# Patient Record
Sex: Female | Born: 1945 | Race: White | Hispanic: No | Marital: Married | Smoking: Never smoker
Health system: Southern US, Community
[De-identification: ages and names within clinical notes are randomized; demographics above are authoritative.]

## PROBLEM LIST (undated history)

## (undated) DIAGNOSIS — E559 Vitamin D deficiency, unspecified: Secondary | ICD-10-CM

## (undated) DIAGNOSIS — E669 Obesity, unspecified: Secondary | ICD-10-CM

## (undated) DIAGNOSIS — E785 Hyperlipidemia, unspecified: Secondary | ICD-10-CM

## (undated) DIAGNOSIS — M810 Age-related osteoporosis without current pathological fracture: Secondary | ICD-10-CM

## (undated) DIAGNOSIS — D649 Anemia, unspecified: Secondary | ICD-10-CM

## (undated) DIAGNOSIS — R7301 Impaired fasting glucose: Secondary | ICD-10-CM

## (undated) HISTORY — DX: Anemia, unspecified: D64.9

## (undated) HISTORY — DX: Impaired fasting glucose: R73.01

## (undated) HISTORY — DX: Age-related osteoporosis without current pathological fracture: M81.0

## (undated) HISTORY — DX: Hyperlipidemia, unspecified: E78.5

## (undated) HISTORY — DX: Vitamin D deficiency, unspecified: E55.9

## (undated) HISTORY — PX: FRACTURE SURGERY: SHX138

## (undated) HISTORY — PX: ABDOMINAL HYSTERECTOMY: SHX81

## (undated) HISTORY — DX: Obesity, unspecified: E66.9

---

## 2007-09-01 ENCOUNTER — Ambulatory Visit: Payer: Self-pay | Admitting: General Surgery

## 2009-05-28 ENCOUNTER — Ambulatory Visit: Payer: Self-pay | Admitting: Unknown Physician Specialty

## 2009-05-28 ENCOUNTER — Emergency Department: Payer: Self-pay | Admitting: Emergency Medicine

## 2009-05-29 ENCOUNTER — Ambulatory Visit: Payer: Self-pay | Admitting: Unknown Physician Specialty

## 2012-07-20 DIAGNOSIS — G4700 Insomnia, unspecified: Secondary | ICD-10-CM | POA: Insufficient documentation

## 2012-07-20 DIAGNOSIS — K649 Unspecified hemorrhoids: Secondary | ICD-10-CM | POA: Insufficient documentation

## 2014-08-29 LAB — HM MAMMOGRAPHY

## 2014-09-17 LAB — HM DEXA SCAN

## 2015-08-13 DIAGNOSIS — E782 Mixed hyperlipidemia: Secondary | ICD-10-CM | POA: Insufficient documentation

## 2015-08-13 DIAGNOSIS — M810 Age-related osteoporosis without current pathological fracture: Secondary | ICD-10-CM | POA: Insufficient documentation

## 2015-08-13 DIAGNOSIS — E669 Obesity, unspecified: Secondary | ICD-10-CM | POA: Insufficient documentation

## 2015-08-13 DIAGNOSIS — E785 Hyperlipidemia, unspecified: Secondary | ICD-10-CM | POA: Insufficient documentation

## 2015-08-13 DIAGNOSIS — E559 Vitamin D deficiency, unspecified: Secondary | ICD-10-CM | POA: Insufficient documentation

## 2015-08-13 DIAGNOSIS — R7301 Impaired fasting glucose: Secondary | ICD-10-CM | POA: Insufficient documentation

## 2015-08-13 DIAGNOSIS — D649 Anemia, unspecified: Secondary | ICD-10-CM | POA: Insufficient documentation

## 2015-08-15 ENCOUNTER — Encounter: Payer: Self-pay | Admitting: Family Medicine

## 2015-08-29 ENCOUNTER — Encounter: Payer: Self-pay | Admitting: Family Medicine

## 2015-09-12 ENCOUNTER — Encounter: Payer: Self-pay | Admitting: Family Medicine

## 2015-09-20 ENCOUNTER — Ambulatory Visit (INDEPENDENT_AMBULATORY_CARE_PROVIDER_SITE_OTHER): Payer: Medicare Other | Admitting: Family Medicine

## 2015-09-20 ENCOUNTER — Encounter: Payer: Self-pay | Admitting: Family Medicine

## 2015-09-20 VITALS — BP 154/68 | HR 60 | Temp 98.7°F | Ht 60.5 in | Wt 179.0 lb

## 2015-09-20 DIAGNOSIS — E559 Vitamin D deficiency, unspecified: Secondary | ICD-10-CM | POA: Diagnosis not present

## 2015-09-20 DIAGNOSIS — K5900 Constipation, unspecified: Secondary | ICD-10-CM | POA: Insufficient documentation

## 2015-09-20 DIAGNOSIS — Z1239 Encounter for other screening for malignant neoplasm of breast: Secondary | ICD-10-CM

## 2015-09-20 DIAGNOSIS — R7301 Impaired fasting glucose: Secondary | ICD-10-CM | POA: Diagnosis not present

## 2015-09-20 DIAGNOSIS — D649 Anemia, unspecified: Secondary | ICD-10-CM

## 2015-09-20 DIAGNOSIS — R635 Abnormal weight gain: Secondary | ICD-10-CM | POA: Diagnosis not present

## 2015-09-20 DIAGNOSIS — Z0000 Encounter for general adult medical examination without abnormal findings: Secondary | ICD-10-CM | POA: Diagnosis not present

## 2015-09-20 DIAGNOSIS — E785 Hyperlipidemia, unspecified: Secondary | ICD-10-CM | POA: Diagnosis not present

## 2015-09-20 DIAGNOSIS — R030 Elevated blood-pressure reading, without diagnosis of hypertension: Secondary | ICD-10-CM

## 2015-09-20 DIAGNOSIS — K5909 Other constipation: Secondary | ICD-10-CM

## 2015-09-20 DIAGNOSIS — Z23 Encounter for immunization: Secondary | ICD-10-CM

## 2015-09-20 DIAGNOSIS — E669 Obesity, unspecified: Secondary | ICD-10-CM

## 2015-09-20 DIAGNOSIS — M810 Age-related osteoporosis without current pathological fracture: Secondary | ICD-10-CM

## 2015-09-20 DIAGNOSIS — IMO0001 Reserved for inherently not codable concepts without codable children: Secondary | ICD-10-CM

## 2015-09-20 NOTE — Assessment & Plan Note (Addendum)
Encouraged weight loss, see AVS; she also try to increase her activity, start low and go slow, increase gradually as tolerated

## 2015-09-20 NOTE — Assessment & Plan Note (Signed)
Check cholesterol today, avoid saturated fats and work on weight loss

## 2015-09-20 NOTE — Assessment & Plan Note (Signed)
Check TSH, as 18 pounds of weight gain in one year is significant; start to gradually and slowly increase activity, watch diet

## 2015-09-20 NOTE — Assessment & Plan Note (Signed)
Normal CBC in May 2015; resolved
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
## 2015-09-20 NOTE — Patient Instructions (Addendum)
Your goal blood pressure is less than 150 mmHg on top. Try to follow the DASH guidelines (DASH stands for Dietary Approaches to Stop Hypertension) Try to limit the sodium in your diet.  Ideally, consume less than 1.5 grams (less than 1,573m) per day. Do not add salt when cooking or at the table.  Check the sodium amount on labels when shopping, and choose items lower in sodium when given a choice. Avoid or limit foods that already contain a lot of sodium. Eat a diet rich in fruits and vegetables and whole grains.  Check out the information at familydoctor.org entitled "What It Takes to Lose Weight" Try to lose between 1-2 pounds per week by taking in fewer calories and burning off more calories You can succeed by limiting portions, limiting foods dense in calories and fat, becoming more active, and drinking 8 glasses of water a day (64 ounces) Don't skip meals, especially breakfast, as skipping meals may alter your metabolism Do not use over-the-counter weight loss pills or gimmicks that claim rapid weight loss A healthy BMI (or body mass index) is between 18.5 and 24.9 You can calculate your ideal BMI at the [place]website hClubMonetize.fr Build up slowly and gradually with exercise  You received the flu shot today; it should protect you against the flu virus over the coming months; it will take about two weeks for antibodies to develop; do try to stay away from hospitals, nursing homes, and daycares during peak flu season; taking 1000 mg of vitamin C daily during flu season may help you avoid getting sick   Try to limit saturated fats in your diet (bologna, hot dogs, barbeque, cheeseburgers, hamburgers, steak, bacon, sausage, cheese, etc.) and get more fresh fruits, vegetables, and whole grains  We do recommend 81 mg coated aspirin daily for stroke prevention; stop if any abdominal pain or blood in stool  Please do call to schedule your  mammogram; the number to schedule [facility] is ([phone] DASH Eating Plan DASH stands for "Dietary Approaches to Stop Hypertension." The DASH eating plan is a healthy eating plan that has been shown to reduce high blood pressure (hypertension). Additional health benefits may include reducing the risk of type 2 diabetes mellitus, heart disease, and stroke. The DASH eating plan may also help with weight loss. WHAT DO I NEED TO KNOW ABOUT THE DASH EATING PLAN? For the DASH eating plan, you will follow these general guidelines:  Choose foods with a percent daily value for sodium of less than 5% (as listed on the food label).  Use salt-free seasonings or herbs instead of table salt or sea salt.  Check with your health care provider or pharmacist before using salt substitutes.  Eat lower-sodium products, often labeled as "lower sodium" or "no salt added."  Eat fresh foods.  Eat more vegetables, fruits, and low-fat dairy products.  Choose whole grains. Look for the word "whole" as the first word in the ingredient list.  Choose fish and skinless chicken or [place] often than red meat. Limit fish, poultry, and meat to 6 oz (170 g) each day.  Limit sweets, desserts, sugars, and sugary drinks.  Choose heart-healthy fats.  Limit cheese to 1 oz (28 g) per day.  Eat more home-cooked food and less restaurant, buffet, and fast food.  Limit fried foods.  Cook foods using methods other than frying.  Limit canned vegetables. If you do use them, rinse them well  to decrease the sodium.  When eating at a restaurant, ask that your food be prepared with less salt, or no salt if possible. WHAT FOODS CAN I EAT? Seek help from a dietitian for individual calorie needs. Grains Whole grain or whole wheat bread. Brown rice. Whole grain or whole wheat pasta. Quinoa, bulgur, and whole grain cereals. Low-sodium cereals. Corn or whole wheat  flour tortillas. Whole grain cornbread. Whole grain crackers. Low-sodium crackers. Vegetables Fresh or frozen vegetables (raw, steamed, roasted, or grilled). Low-sodium or reduced-sodium tomato and vegetable juices. Low-sodium or reduced-sodium tomato sauce and paste. Low-sodium or reduced-sodium canned vegetables.  Fruits All fresh, canned (in natural juice), or frozen fruits. Meat and Other Protein Products Ground beef (85% or leaner), grass-fed beef, or beef trimmed of fat. Skinless chicken or [place]. Ground chicken or [place]. Pork trimmed of fat. All fish and seafood. Eggs. Dried beans, peas, or lentils. Unsalted nuts and seeds. Unsalted canned beans. Dairy Low-fat dairy products, such as skim or 1% milk, 2% or reduced-fat cheeses, low-fat ricotta or cottage cheese, or plain low-fat yogurt. Low-sodium or reduced-sodium cheeses. Fats and Oils Tub margarines without trans fats. Light or reduced-fat mayonnaise and salad dressings (reduced sodium). Avocado. Safflower, olive, or canola oils. Natural peanut or almond butter. Other Unsalted popcorn and pretzels. The items listed above may not be a complete list of recommended foods or beverages. Contact your dietitian for more options. WHAT FOODS ARE NOT RECOMMENDED? Grains White bread. White pasta. White rice. Refined cornbread. Bagels and croissants. Crackers that contain trans fat. Vegetables Creamed or fried vegetables. Vegetables in a cheese sauce. Regular canned vegetables. Regular canned tomato sauce and paste. Regular tomato and vegetable juices. Fruits Dried fruits. Canned fruit in light or heavy syrup. Fruit juice. Meat and Other Protein Products Fatty cuts of meat. Ribs, chicken wings, bacon, sausage, bologna, salami, chitterlings, fatback, hot dogs, bratwurst, and packaged luncheon meats. Salted nuts and seeds. Canned beans with salt. Dairy Whole or 2% milk, cream, half-and-half, and cream cheese. Whole-fat or sweetened yogurt.  Full-fat cheeses or blue cheese. Nondairy creamers and whipped toppings. Processed cheese, cheese spreads, or cheese curds. Condiments Onion and garlic salt, seasoned salt, table salt, and sea salt. Canned and packaged gravies. Worcestershire sauce. Tartar sauce. Barbecue sauce. Teriyaki sauce. Soy sauce, including reduced sodium. Steak sauce. Fish sauce. Oyster sauce. Cocktail sauce. Horseradish. Ketchup and mustard. Meat flavorings and tenderizers. Bouillon cubes. Hot sauce. Tabasco sauce. Marinades. Taco seasonings. Relishes. Fats and Oils Butter, stick margarine, lard, shortening, ghee, and bacon fat. Coconut, palm kernel, or palm oils. Regular salad dressings. Other Pickles and olives. Salted popcorn and pretzels. The items listed above may not be a complete list of foods and beverages to avoid. Contact your dietitian for more information. WHERE CAN I FIND MORE INFORMATION? National Heart, Lung, and Blood Institute: travelstabloid.com   This information is not intended to replace advice given to you by your health care provider. Make sure you discuss any questions you have with your health care provider.   Document Released: 10/29/2011 Document Revised: 11/30/2014 Document Reviewed: 09/13/2013 Elsevier Interactive Patient Education 2016 [place] Maintenance, Female Adopting a healthy lifestyle and getting preventive care can go a long way to promote health and wellness. Talk with your health care provider about what schedule of regular examinations is right for you. This is a good chance for you to check in with your provider about disease prevention and staying healthy. In between checkups, there are plenty of things you can  do on your own. Experts have done a lot of research about which lifestyle changes and preventive measures are most likely to keep you healthy. Ask your health care provider for more information. WEIGHT AND DIET  Eat a healthy  diet  Be sure to include plenty of vegetables, fruits, low-fat dairy products, and lean protein.  Do not eat a lot of foods high in solid fats, added sugars, or salt.  Get regular exercise. This is one of the most important things you can do for your health.  Most adults should exercise for at least 150 minutes each week. The exercise should increase your heart rate and make you sweat (moderate-intensity exercise).  Most adults should also do strengthening exercises at least twice a week. This is in addition to the moderate-intensity exercise.  Maintain a healthy weight  Body mass index (BMI) is a measurement that can be used to identify possible weight problems. It estimates body fat based on height and weight. Your health care provider can help determine your BMI and help you achieve or maintain a healthy weight.  For females 86 years of age and older:   A BMI below 18.5 is considered underweight.  A BMI of 18.5 to 24.9 is normal.  A BMI of 25 to 29.9 is considered overweight.  A BMI of 30 and above is considered obese.  Watch levels of cholesterol and blood lipids  You should start having your blood tested for lipids and cholesterol at 68 years of age, then have this test every 5 years.  You may need to have your cholesterol levels checked more often if:  Your lipid or cholesterol levels are high.  You are older than 68 years of age.  You are at high risk for heart disease.  CANCER SCREENING   Lung Cancer  Lung cancer screening is recommended for adults 106-25 years old who are at high risk for lung cancer because of a history of smoking.  A yearly low-dose CT scan of the lungs is recommended for people who:  Currently smoke.  Have quit within the past 15 years.  Have at least a 30-pack-year history of smoking. A pack year is smoking an average of one pack of cigarettes a day for 1 year.  Yearly screening should continue until it has been 15 years since you  quit.  Yearly screening should stop if you develop a health problem that would prevent you from having lung cancer treatment.  Breast Cancer  Practice breast self-awareness. This means understanding how your breasts normally appear and feel.  It also means doing regular breast self-exams. Let your health care provider know about any changes, no matter how small.  If you are in your 20s or 30s, you should have a clinical breast exam (CBE) by a health care provider every 1-3 years as part of a regular health exam.  If you are 69 or older, have a CBE every year. Also consider having a breast X-ray (mammogram) every year.  If you have a family history of breast cancer, talk to your health care provider about genetic screening.  If you are at high risk for breast cancer, talk to your health care provider about having an MRI and a mammogram every year.  Breast cancer gene (BRCA) assessment is recommended for women who have family members with BRCA-related cancers. BRCA-related cancers include:  Breast.  Ovarian.  Tubal.  Peritoneal cancers.  Results of the assessment will determine the need for genetic counseling and BRCA1  and BRCA2 testing. Cervical Cancer Your health care provider may recommend that you be screened regularly for cancer of the pelvic organs (ovaries, uterus, and vagina). This screening involves a pelvic examination, including checking for microscopic changes to the surface of your cervix (Pap test). You may be encouraged to have this screening done every 3 years, beginning at age 27.  For women ages 65-65, health care providers may recommend pelvic exams and Pap testing every 3 years, or they may recommend the Pap and pelvic exam, combined with testing for human papilloma virus (HPV), every 5 years. Some types of HPV increase your risk of cervical cancer. Testing for HPV may also be done on women of any age with unclear Pap test results.  Other health care providers may  not recommend any screening for nonpregnant women who are considered low risk for pelvic cancer and who do not have symptoms. Ask your health care provider if a screening pelvic exam is right for you.  If you have had past treatment for cervical cancer or a condition that could lead to cancer, you need Pap tests and screening for cancer for at least 20 years after your treatment. If Pap tests have been discontinued, your risk factors (such as having a new sexual partner) need to be reassessed to determine if screening should resume. Some women have medical problems that increase the chance of getting cervical cancer. In these cases, your health care provider may recommend more frequent screening and Pap tests. Colorectal Cancer  This type of cancer can be detected and often prevented.  Routine colorectal cancer screening usually begins at 68 years of age and continues through 68 years of age.  Your health care provider may recommend screening at an earlier age if you have risk factors for colon cancer.  Your health care provider may also recommend using home test kits to check for hidden blood in the stool.  A small camera at the end of a tube can be used to examine your colon directly (sigmoidoscopy or colonoscopy). This is done to check for the earliest forms of colorectal cancer.  Routine screening usually begins at age 47.  Direct examination of the colon should be repeated every 5-10 years through 68 years of age. However, you may need to be screened more often if early forms of precancerous polyps or small growths are found. Skin Cancer  Check your skin from head to toe regularly.  Tell your health care provider about any new moles or changes in moles, especially if there is a change in a mole's shape or color.  Also tell your health care provider if you have a mole that is larger than the size of a pencil eraser.  Always use sunscreen. Apply sunscreen liberally and repeatedly  throughout the day.  Protect yourself by wearing long sleeves, pants, a wide-brimmed hat, and sunglasses whenever you are outside. HEART DISEASE, DIABETES, AND HIGH BLOOD PRESSURE   High blood pressure causes heart disease and increases the risk of stroke. High blood pressure is more likely to develop in:  People who have blood pressure in the high end of the normal range (130-139/85-89 mm Hg).  People who are overweight or obese.  People who are African American.  If you are 35-21 years of age, have your blood pressure checked every 3-5 years. If you are 76 years of age or older, have your blood pressure checked every year. You should have your blood pressure measured twice--once when you are at a  hospital or clinic, and once when you are not at a hospital or clinic. Record the average of the two measurements. To check your blood pressure when you are not at a hospital or clinic, you can use:  An automated blood pressure machine at a pharmacy.  A home blood pressure monitor.  If you are between 35 years and 75 years old, ask your health care provider if you should take aspirin to prevent strokes.  Have regular diabetes screenings. This involves taking a blood sample to check your fasting blood sugar level.  If you are at a normal weight and have a low risk for diabetes, have this test once every three years after 68 years of age.  If you are overweight and have a high risk for diabetes, consider being tested at a younger age or more often. PREVENTING INFECTION  Hepatitis B  If you have a higher risk for hepatitis B, you should be screened for this virus. You are considered at high risk for hepatitis B if:  You were born in a country where hepatitis B is common. Ask your health care provider which countries are considered high risk.  Your parents were born in a high-risk country, and you have not been immunized against hepatitis B (hepatitis B vaccine).  You have HIV or  AIDS.  You use needles to inject street drugs.  You live with someone who has hepatitis B.  You have had sex with someone who has hepatitis B.  You get hemodialysis treatment.  You take certain medicines for conditions, including cancer, organ transplantation, and autoimmune conditions. Hepatitis C  Blood testing is recommended for:  Everyone born from [birth year] through 1964.  Anyone with known risk factors for hepatitis C. Sexually transmitted infections (STIs)  You should be screened for sexually transmitted infections (STIs) including gonorrhea and chlamydia if:  You are sexually active and are younger than 68 years of age.  You are older than 68 years of age and your health care provider tells you that you are at risk for this type of infection.  Your sexual activity has changed since you were last screened and you are at an increased risk for chlamydia or gonorrhea. Ask your health care provider if you are at risk.  If you do not have HIV, but are at risk, it may be recommended that you take a prescription medicine daily to prevent HIV infection. This is called pre-exposure prophylaxis (PrEP). You are considered at risk if:  You are sexually active and do not regularly use condoms or know the HIV status of your partner(s).  You take drugs by injection.  You are sexually active with a partner who has HIV. Talk with your health care provider about whether you are at high risk of being infected with HIV. If you choose to begin PrEP, you should first be tested for HIV. You should then be tested every 3 months for as long as you are taking PrEP.  PREGNANCY   If you are premenopausal and you may become pregnant, ask your health care provider about preconception counseling.  If you may become pregnant, take 400 to 800 micrograms (mcg) of folic acid every day.  If you want to prevent pregnancy, talk to your health care provider about birth control (contraception). OSTEOPOROSIS AND  MENOPAUSE   Osteoporosis is a disease in which the bones lose minerals and strength with aging. This can result in serious bone fractures. Your risk for osteoporosis can be identified using a  bone density scan.  If you are 55 years of age or older, or if you are at risk for osteoporosis and fractures, ask your health care provider if you should be screened.  Ask your health care provider whether you should take a calcium or vitamin D supplement to lower your risk for osteoporosis.  Menopause may have certain physical symptoms and risks.  Hormone replacement therapy may reduce some of these symptoms and risks. Talk to your health care provider about whether hormone replacement therapy is right for you.  HOME CARE INSTRUCTIONS   Schedule regular health, dental, and eye exams.  Stay current with your immunizations.   Do not use any tobacco products including cigarettes, chewing tobacco, or electronic cigarettes.  If you are pregnant, do not drink alcohol.  If you are breastfeeding, limit how much and how often you drink alcohol.  Limit alcohol intake to no more than 1 drink per day for nonpregnant women. One drink equals 12 ounces of beer, 5 ounces of wine, or 1 ounces of hard liquor.  Do not use street drugs.  Do not share needles.  Ask your health care provider for help if you need support or information about quitting drugs.  Tell your health care provider if you often feel depressed.  Tell your health care provider if you have ever been abused or do not feel safe at home.   This information is not intended to replace advice given to you by your health care provider. Make sure you discuss any questions you have with your health care provider.   Document Released: 05/25/2011 Document Revised: 11/30/2014 Document Reviewed: 10/11/2013 Elsevier Interactive Patient Education [employer].

## 2015-09-20 NOTE — Assessment & Plan Note (Signed)
Fall prevention, vit D, three servings of calcium daily; DEXA scan next year

## 2015-09-20 NOTE — Assessment & Plan Note (Addendum)
She is trying conservative approach; will check TSH to see if hypothyroidism partly responsible; she also try to increase her activity, start low and go slow, increase gradually as tolerated

## 2015-09-20 NOTE — Assessment & Plan Note (Signed)
Explained BP is higher than ideal today; she'll start to check her pressures at home and notify me if not to goal; work on weight loss, slowly and gradually increasing activity

## 2015-09-20 NOTE — Assessment & Plan Note (Signed)
Check A1C today

## 2015-09-20 NOTE — Assessment & Plan Note (Signed)
Check vit D, important for bone health

## 2015-09-21 LAB — COMPREHENSIVE METABOLIC PANEL
A/G RATIO: 1.9 (ref 1.1–2.5)
ALBUMIN: 4.5 g/dL (ref 3.6–4.8)
ALT: 11 IU/L (ref 0–32)
AST: 17 IU/L (ref 0–40)
Alkaline Phosphatase: 74 IU/L (ref 39–117)
BILIRUBIN TOTAL: 0.3 mg/dL (ref 0.0–1.2)
BUN / CREAT RATIO: 12 (ref 11–26)
BUN: 9 mg/dL (ref 8–27)
CALCIUM: 9.5 mg/dL (ref 8.7–10.3)
CHLORIDE: 102 mmol/L (ref 97–106)
CO2: 25 mmol/L (ref 18–29)
Creatinine, Ser: 0.73 mg/dL (ref 0.57–1.00)
GFR, EST AFRICAN AMERICAN: 98 mL/min/{1.73_m2} (ref >59)
GFR, EST NON AFRICAN AMERICAN: 85 mL/min/{1.73_m2} (ref >59)
GLOBULIN, TOTAL: 2.4 g/dL (ref 1.5–4.5)
Glucose: 85 mg/dL (ref 65–99)
POTASSIUM: 4.7 mmol/L (ref 3.5–5.2)
SODIUM: 143 mmol/L (ref 136–144)
TOTAL PROTEIN: 6.9 g/dL (ref 6.0–8.5)

## 2015-09-21 LAB — LIPID PANEL W/O CHOL/HDL RATIO
Cholesterol, Total: 196 mg/dL (ref 100–199)
HDL: 36 mg/dL — AB (ref >39)
LDL Calculated: 145 mg/dL — ABNORMAL HIGH (ref 0–99)
Triglycerides: 73 mg/dL (ref 0–149)
VLDL Cholesterol Cal: 15 mg/dL (ref 5–40)

## 2015-09-21 LAB — TSH: TSH: 1.61 u[IU]/mL (ref 0.450–4.500)

## 2015-09-21 LAB — VITAMIN D 25 HYDROXY (VIT D DEFICIENCY, FRACTURES): VIT D 25 HYDROXY: 40.8 ng/mL (ref 30.0–100.0)

## 2015-09-21 LAB — HGB A1C W/O EAG: Hgb A1c MFr Bld: 5.9 % — ABNORMAL HIGH (ref 4.8–5.6)

## 2015-09-23 ENCOUNTER — Encounter: Payer: Self-pay | Admitting: Family Medicine

## 2015-10-25 ENCOUNTER — Ambulatory Visit (INDEPENDENT_AMBULATORY_CARE_PROVIDER_SITE_OTHER): Payer: Medicare Other | Admitting: Sports Medicine

## 2015-10-25 ENCOUNTER — Ambulatory Visit (INDEPENDENT_AMBULATORY_CARE_PROVIDER_SITE_OTHER): Payer: Medicare Other

## 2015-10-25 ENCOUNTER — Encounter: Payer: Self-pay | Admitting: Sports Medicine

## 2015-10-25 DIAGNOSIS — M722 Plantar fascial fibromatosis: Secondary | ICD-10-CM | POA: Diagnosis not present

## 2015-10-25 DIAGNOSIS — M79671 Pain in right foot: Secondary | ICD-10-CM | POA: Diagnosis not present

## 2015-10-25 MED ORDER — MELOXICAM 15 MG PO TABS: 15.0000 mg | ORAL_TABLET | Freq: Every day | ORAL | Status: DC

## 2015-10-25 MED ORDER — TRIAMCINOLONE ACETONIDE 10 MG/ML IJ SUSP: 10.0000 mg | Freq: Once | INTRAMUSCULAR | Status: DC

## 2015-10-25 NOTE — Patient Instructions (Signed)

## 2015-10-25 NOTE — Progress Notes (Signed)
Patient ID: Cheyenne Parker, female   DOB: 10/09/1946, 69 y.o.   MRN: [id number] Subjective: Cheyenne Parker is a 69 y.o. female patient presents to office with complaint of heel pain on the right. Patient admits to post static dyskinesia for 2 months in duration. Patient has treated this problem with change in shoes. Patient reports that this treatment has not worked. Patient denies any other pedal complaints.  Admits to an increase in weight.   Admits to a previous history over 20 years ago of Plantar fasciitis.   Review of Systems  HENT:       Sinus problems   Musculoskeletal:       Osteoarthritis   Skin:       Dry skin  Hematological:       Nose bleeds   All other systems reviewed and are negative.  Patient Active Problem List   Diagnosis Date Noted  . Constipation 09/20/2015  . Abnormal weight gain 09/20/2015  . Elevated blood pressure 09/20/2015  . Osteoporosis   . Vitamin D deficiency disease   . IFG (impaired fasting glucose)   . Hyperlipidemia   . Anemia   . Obesity    No current outpatient prescriptions on file prior to visit.   No current facility-administered medications on file prior to visit.   No Known Allergies   Objective: Physical Exam General: The patient is alert and oriented x3 in no acute distress.  Dermatology: Skin is warm, dry and supple bilateral lower extremities. Nails 1-10 are normal. There is no erythema, edema, no eccymosis, no open lesions present. Integument is otherwise unremarkable.  Vascular: Dorsalis Pedis pulse and Posterior Tibial pulse are 2/4 bilateral. Capillary fill time is immediate to all digits.  Neurological: Grossly intact to light touch with an achilles reflex of +2/5 and a  negative Tinel's sign bilateral.  Musculoskeletal: Tenderness to palpation at the medial calcaneal tubercale and through the insertion of the plantar fascia on the right foot. No pain with compression of calcaneus bilateral. No pain with tuning fork to  calcaneus bilateral. No pain with calf compression bilateral. There is decreased Ankle joint range of motion right>left. All other joints range of motion within normal limits bilateral. Strength 5/5 in all groups bilateral.   Gait: Unassisted, Antalgic avoid weight on right heel  Xray, Right foot: 3 Views Normal osseous mineralization. Joint spaces preserved. No fracture/dislocation/boney destruction. Calcaneal spurs present with mild thickening of plantar fascia. No other soft tissue abnormalities or radiopaque foreign bodies.   Assessment and Plan: Problem List Items Addressed This Visit    None    Visit Diagnoses    Right foot pain    -  Primary    Relevant Medications    triamcinolone acetonide (KENALOG) 10 MG/ML injection 10 mg    meloxicam (MOBIC) 15 MG tablet    Other Relevant Orders    DG Foot Complete Right    Plantar fasciitis of right foot        Relevant Medications    triamcinolone acetonide (KENALOG) 10 MG/ML injection 10 mg    meloxicam (MOBIC) 15 MG tablet       -Complete examination performed. Discussed with patient in detail the condition of plantar fasciitis, how this occurs and general treatment options. Explained both conservative and surgical treatments.  -After oral consent and aseptic prep, injected a mixture containing 1 ml of 2%  plain lidocaine, 1 ml 0.5% plain marcaine, 0.5 ml of kenalog 10 and 0.5 ml of dexamethasone phosphate  into right heel. Post-injection care discussed with patient.  -Rx Meloxicam -Recommended good supportive shoes and advised use of OTC insert. Explained to patient that if these orthoses work well, we will continue with these. If these do not improve her condition and  pain, we will consider custom molded orthoses. - Explained in detail the use of the fascial brace & night splint which was dispensed at today's visit. -Explained and dispensed to patient daily stretching exercises. -Recommend patient to ice affected area 1-2x  daily. -Patient to return to office in 3 weeks for follow up or sooner if problems or questions  Arise.  Asencion Islam, DPM

## 2015-10-25 NOTE — Progress Notes (Deleted)
   Subjective:    Patient ID: Cheyenne Parker, female    DOB: 09/25/1946, 69 y.o.   MRN: [id number]  HPI    Review of Systems  HENT:       Sinus problems   Musculoskeletal:       Osteoarthritis   Skin:       Dry skin  Hematological:       Nose bleeds   All other systems reviewed and are negative.      Objective:   Physical Exam        Assessment & Plan:

## 2015-11-19 ENCOUNTER — Ambulatory Visit (INDEPENDENT_AMBULATORY_CARE_PROVIDER_SITE_OTHER): Payer: Medicare Other | Admitting: Sports Medicine

## 2015-11-19 ENCOUNTER — Encounter: Payer: Self-pay | Admitting: Sports Medicine

## 2015-11-19 DIAGNOSIS — M722 Plantar fascial fibromatosis: Secondary | ICD-10-CM | POA: Diagnosis not present

## 2015-11-19 DIAGNOSIS — M79671 Pain in right foot: Secondary | ICD-10-CM

## 2015-11-19 NOTE — Progress Notes (Signed)
Patient ID: Cheyenne Parker F Parker, female   DOB: 12/20/1945, 69 y.o.   MRN: [id number] Subjective: Cheyenne Parker F Chrestman is a 69 y.o. female returns to office for follow up evaluation after Right heel injection for plantar fasciitis, injection #1 administered 3 week(s) ago. Patient states that the injection seems to help her pain; 99% better. Patient states that she only took Mobic for 2 days was afraid of any possible side effects, warning label said "death" on it so did not proceed with taking it. Patient denies any recent changes in medications or new problems since last visit.   Patient Active Problem List   Diagnosis Date Noted  . Constipation 09/20/2015  . Abnormal weight gain 09/20/2015  . Elevated blood pressure 09/20/2015  . Osteoporosis   . Vitamin D deficiency disease   . IFG (impaired fasting glucose)   . Hyperlipidemia   . Anemia   . Obesity    Current Outpatient Prescriptions on File Prior to Visit  Medication Sig Dispense Refill  . meloxicam (MOBIC) 15 MG tablet Take 1 tablet (15 mg total) by mouth daily. 30 tablet 0   Current Facility-Administered Medications on File Prior to Visit  Medication Dose Route Frequency Provider Last Rate Last Dose  . triamcinolone acetonide (KENALOG) 10 MG/ML injection 10 mg  10 mg Other Once [employer] Canaan Holzer, DPM       No Known Allergies   Objective:   General:  Alert and oriented x 3, in no acute distress  Dermatology: Skin is warm, dry, and supple bilateral. Nails are within normal limits. There is no lower extremity erythema, no eccymosis, no open lesions present bilateral.   Vascular: Dorsalis Pedis and Posterior Tibial pedal pulses are 2/4 bilateral. + hair growth noted bilateral. Capillary Fill Time is 3 seconds in all digits. No varicosities, No edema bilateral lower extremities.   Neurological: Sensation grossly intact to light touch with an achilles reflex of +2 and a  negative Tinel's sign bilateral. Vibratory, sharp/dull, Semmes Weinstein  Monofilament within normal limits.   Musculoskeletal: There is no tenderness to palpation at the medial calcaneal tubercale and through the insertion of the plantar fascia on the right foot. No pain with compression to calcaneus or application of tuning fork. There is decreased Ankle joint range of motion right>left. All other joints range of motion  within normal limits bilateral. Strength 5/5 bilateral.   Assessment and Plan: Problem List Items Addressed This Visit    None    Visit Diagnoses    Right foot pain    -  Primary    Plantar fasciitis of right foot        Improved       -Complete examination performed.  -Previous x-rays reviewed. -Discussed with patient in detail the condition of plantar fasciitis, how this  occurs related to the foot type of the patient and general treatment options. - No injection given; no pain to right foot at plantar fascia -Advised patient over the next few weeks to wean from braces -Continue with stretching, icing, good supportive shoes, inserts daily.  -Discussed long term care and reocurrence; will closely monitor -Patient to return to office as needed or sooner if problems or questions arise.  Asencion Islamitorya Alanzo Lamb, DPM

## 2015-11-26 ENCOUNTER — Telehealth: Payer: Self-pay | Admitting: Family Medicine

## 2015-11-26 NOTE — Telephone Encounter (Signed)
Abnormal mammogram received from outside source; it looks like another mammogram was ordered outside on Dec 20th; I just don't have any results; I was calling to f/u on this; left msg for patient

## 2015-11-27 NOTE — Telephone Encounter (Signed)
Cheyenne Parker, Please call patient See if she has had additional breast imaging Thank you

## 2015-12-04 NOTE — Telephone Encounter (Signed)
I spoke with [facility]. She had her routine mammo on 10/30/15 and then had the additional views on 11/12/15. We got the report from 12/20. They are sending us the report from 10/30/15.

## 2015-12-04 NOTE — Telephone Encounter (Signed)
Patient states she did go back right after christmas and had the additional views done. I'll call them and have them send me the report.

## 2015-12-04 NOTE — Telephone Encounter (Signed)
I saw both 12/7 and 12/20 results; thank you thank you

## 2016-02-18 ENCOUNTER — Encounter: Payer: Self-pay | Admitting: *Deleted

## 2016-02-18 ENCOUNTER — Ambulatory Visit (INDEPENDENT_AMBULATORY_CARE_PROVIDER_SITE_OTHER): Payer: Medicare Other

## 2016-02-18 ENCOUNTER — Ambulatory Visit
Admission: EM | Admit: 2016-02-18 | Discharge: 2016-02-18 | Disposition: A | Discharge status: Home / Self Care | Payer: Medicare Other | Attending: Family Medicine | Admitting: Family Medicine

## 2016-02-18 DIAGNOSIS — J40 Bronchitis, not specified as acute or chronic: Secondary | ICD-10-CM

## 2016-02-18 MED ORDER — AZITHROMYCIN 250 MG PO TABS: ORAL_TABLET | ORAL | Status: DC

## 2016-02-18 MED ORDER — HYDROCOD POLST-CPM POLST ER 10-8 MG/5ML PO SUER: 5.0000 mL | Freq: Two times a day (BID) | ORAL | Status: DC

## 2016-02-18 MED ORDER — HYDROCOD POLST-CPM POLST ER 10-8 MG/5ML PO SUER: 5.0000 mL | Freq: Two times a day (BID) | ORAL | Status: DC | PRN

## 2016-02-18 NOTE — ED Notes (Signed)
Pt states that she has had cough and congestion for about 1 week.

## 2016-02-18 NOTE — Discharge Instructions (Signed)
Upper Respiratory Infection, Adult Most upper respiratory infections (URIs) are a viral infection of the air passages leading to the lungs. A URI affects the nose, throat, and upper air passages. The most common type of URI is nasopharyngitis and is typically referred to as "the common cold." URIs run their course and usually go away on their own. Most of the time, a URI does not require medical attention, but sometimes a bacterial infection in the upper airways can follow a viral infection. This is called a secondary infection. Sinus and middle ear infections are common types of secondary upper respiratory infections. Bacterial pneumonia can also complicate a URI. A URI can worsen asthma and chronic obstructive pulmonary disease (COPD). Sometimes, these complications can require emergency medical care and may be life threatening.  CAUSES Almost all URIs are caused by viruses. A virus is a type of germ and can spread from one person to another.  RISKS FACTORS You may be at risk for a URI if:   You smoke.   You have chronic heart or lung disease.  You have a weakened defense (immune) system.   You are very young or very old.   You have nasal allergies or asthma.  You work in crowded or poorly ventilated areas.  You work in health care facilities or schools. SIGNS AND SYMPTOMS  Symptoms typically develop 2-3 days after you come in contact with a cold virus. Most viral URIs last 7-10 days. However, viral URIs from the influenza virus (flu virus) can last 14-18 days and are typically more severe. Symptoms may include:   Runny or stuffy (congested) nose.   Sneezing.   Cough.   Sore throat.   Headache.   Fatigue.   Fever.   Loss of appetite.   Pain in your forehead, behind your eyes, and over your cheekbones (sinus pain).  Muscle aches.  DIAGNOSIS  Your health care provider may diagnose a URI by:  Physical exam.  Tests to check that your symptoms are not due to  another condition such as:  Strep throat.  Sinusitis.  Pneumonia.  Asthma. TREATMENT  A URI goes away on its own with time. It cannot be cured with medicines, but medicines may be prescribed or recommended to relieve symptoms. Medicines may help:  Reduce your fever.  Reduce your cough.  Relieve nasal congestion. HOME CARE INSTRUCTIONS   Take medicines only as directed by your health care provider.   Gargle warm saltwater or take cough drops to comfort your throat as directed by your health care provider.  Use a warm mist humidifier or inhale steam from a shower to increase air moisture. This may make it easier to breathe.  Drink enough fluid to keep your urine clear or pale yellow.   Eat soups and other clear broths and maintain good nutrition.   Rest as needed.   Return to work when your temperature has returned to normal or as your health care provider advises. You may need to stay home longer to avoid infecting others. You can also use a face mask and careful hand washing to prevent spread of the virus.  Increase the usage of your inhaler if you have asthma.   Do not use any tobacco products, including cigarettes, chewing tobacco, or electronic cigarettes. If you need help quitting, ask your health care provider. PREVENTION  The best way to protect yourself from getting a cold is to practice good hygiene.   Avoid oral or hand contact with people with cold   symptoms.   Wash your hands often if contact occurs.  There is no clear evidence that vitamin C, vitamin E, echinacea, or exercise reduces the chance of developing a cold. However, it is always recommended to get plenty of rest, exercise, and practice good nutrition.  SEEK MEDICAL CARE IF:   You are getting worse rather than better.   Your symptoms are not controlled by medicine.   You have chills.  You have worsening shortness of breath.  You have brown or red mucus.  You have yellow or brown nasal  discharge.  You have pain in your face, especially when you bend forward.  You have a fever.  You have swollen neck glands.  You have pain while swallowing.  You have white areas in the back of your throat. SEEK IMMEDIATE MEDICAL CARE IF:   You have severe or persistent:  Headache.  Ear pain.  Sinus pain.  Chest pain.  You have chronic lung disease and any of the following:  Wheezing.  Prolonged cough.  Coughing up blood.  A change in your usual mucus.  You have a stiff neck.  You have changes in your:  Vision.  Hearing.  Thinking.  Mood. MAKE SURE YOU:   Understand these instructions.  Will watch your condition.  Will get help right away if you are not doing well or get worse.   This information is not intended to replace advice given to you by your health care provider. Make sure you discuss any questions you have with your health care provider.   Document Released: 05/05/2001 Document Revised: 03/26/2015 Document Reviewed: 02/14/2014 Elsevier Interactive Patient Education 2016 Elsevier Inc.  

## 2016-02-18 NOTE — ED Provider Notes (Signed)
CSN: [id number]     Arrival date & time 02/18/16  1501 History   First MD Initiated Contact with Patient 02/18/16 1648     Chief Complaint  Patient presents with  . Nasal Congestion   (Consider location/radiation/quality/duration/timing/severity/associated sxs/prior Treatment) HPI   This a 70 year old female who presents with cough and congestion that has lasted about a week. He states that began with a headache earache and sore throat but has since gone into her chest. Since she is getting a productive cough of yellow sputum. Fevers at first but is not febrile at the present time. After today is 98.2 pulse 67 blood pressure 157/77 and O2 sats of 96% on room air. Patient has never smoked.     Past Medical History  Diagnosis Date  . Osteoporosis   . Vitamin D deficiency disease   . IFG (impaired fasting glucose)   . Hyperlipidemia   . Anemia   . Obesity    Past Surgical History  Procedure Laterality Date  . Abdominal hysterectomy      complete  . Fracture surgery     Family History  Problem Relation Age of Onset  . Hypertension Mother   . Stroke Mother     possibly  . Kidney disease Father   . Diabetes Father   . Heart disease Father   . Hypertension Father    Social History  Substance Use Topics  . Smoking status: Never Smoker   . Smokeless tobacco: Never Used  . Alcohol Use: Yes     Comment: socially   OB History    No data available     Review of Systems  Constitutional: Positive for fever, chills, activity change and fatigue.  HENT: Positive for congestion and sore throat.   Respiratory: Positive for cough and shortness of breath.   All other systems reviewed and are negative.   Allergies  Review of patient's allergies indicates no known allergies.  Home Medications   Prior to Admission medications   Medication Sig Start Date End Date Taking? Authorizing Provider  fluorouracil (EFUDEX) 5 % cream Apply topically 2 (two) times daily.   Yes Historical  Provider, MD  azithromycin (ZITHROMAX Z-PAK) 250 MG tablet Use as per package instructions 02/18/16   Lutricia Feil, PA-C  meloxicam (MOBIC) 15 MG tablet Take 1 tablet (15 mg total) by mouth daily. 10/25/15   Asencion Islam, DPM   Meds Ordered and Administered this Visit  Medications - No data to display  BP 157/77 mmHg  Pulse 67  Temp(Src) 98.2 F (36.8 C) (Oral)  Ht 5' (1.524 m)  Wt 176 lb (79.833 kg)  BMI 34.37 kg/m2  SpO2 96% No data found.   Physical Exam  Constitutional: She is oriented to person, place, and time. She appears well-developed and well-nourished. No distress.  HENT:  Head: Normocephalic and atraumatic.  Nose: Nose normal.  Mouth/Throat: Oropharynx is clear and moist. No oropharyngeal exudate.  Both ear canals are occluded with cerumen  Eyes: Conjunctivae are normal. Pupils are equal, round, and reactive to light.  Neck: Normal range of motion. Neck supple.  Pulmonary/Chest: Effort normal. She has rales.  Lungs shows non-tussive crackles and rhonchi and a small area of the right middle lobe  Musculoskeletal: Normal range of motion.  Lymphadenopathy:    She has no cervical adenopathy.  Neurological: She is alert and oriented to person, place, and time.  Skin: Skin is warm and dry. She is not diaphoretic.  Psychiatric: She has a normal  mood and affect. Her behavior is normal. Judgment and thought content normal.  Nursing note and vitals reviewed.   ED Course  Procedures (including critical care time)  Labs Review Labs Reviewed - No data to display  Imaging Review Dg Chest 2 View  02/18/2016  CLINICAL DATA:  Cough EXAM: CHEST  2 VIEW COMPARISON:  None. FINDINGS: Top-normal heart size. Minimally tortuous atherosclerotic thoracic aorta. Otherwise normal mediastinal contour. No pneumothorax. No pleural effusion. Lungs appear clear, with no acute consolidative airspace disease and no pulmonary edema. IMPRESSION: No active cardiopulmonary disease.  Electronically Signed   By: Delbert PhenixJason A Poff M.D.   On: 02/18/2016 17:37     Visual Acuity Review  Right Eye Distance:   Left Eye Distance:   Bilateral Distance:    Right Eye Near:   Left Eye Near:    Bilateral Near:         MDM   1. Bronchitis    New Prescriptions   AZITHROMYCIN (ZITHROMAX Z-PAK) 250 MG TABLET    Use as per package instructions  Plan: 1. Test/x-ray results and diagnosis reviewed with patient 2. rx as per orders; risks, benefits, potential side effects reviewed with patient 3. Recommend supportive treatment with Rest and fluids as necessary. I'll start her empirically on a Z-Pak cause of the findings as well exam findings as noted above. Follow-up with her primary care physician if she is not improving or is worsening 4. F/u prn if symptoms worsen or don't improve      Lutricia FeilWilliam P Corneisha Alvi, PA-C 02/18/16 1810

## 2016-03-20 ENCOUNTER — Encounter: Payer: Self-pay | Admitting: Family Medicine

## 2016-03-20 ENCOUNTER — Ambulatory Visit (INDEPENDENT_AMBULATORY_CARE_PROVIDER_SITE_OTHER): Payer: Medicare Other | Admitting: Family Medicine

## 2016-03-20 VITALS — BP 122/82 | HR 83 | Temp 98.1°F | Resp 16 | Ht 60.0 in | Wt 177.0 lb

## 2016-03-20 DIAGNOSIS — E785 Hyperlipidemia, unspecified: Secondary | ICD-10-CM

## 2016-03-20 DIAGNOSIS — R030 Elevated blood-pressure reading, without diagnosis of hypertension: Secondary | ICD-10-CM

## 2016-03-20 DIAGNOSIS — E669 Obesity, unspecified: Secondary | ICD-10-CM

## 2016-03-20 DIAGNOSIS — R7301 Impaired fasting glucose: Secondary | ICD-10-CM | POA: Diagnosis not present

## 2016-03-20 DIAGNOSIS — D649 Anemia, unspecified: Secondary | ICD-10-CM

## 2016-03-20 DIAGNOSIS — IMO0001 Reserved for inherently not codable concepts without codable children: Secondary | ICD-10-CM

## 2016-03-20 DIAGNOSIS — M810 Age-related osteoporosis without current pathological fracture: Secondary | ICD-10-CM

## 2016-03-20 DIAGNOSIS — K5909 Other constipation: Secondary | ICD-10-CM | POA: Diagnosis not present

## 2016-03-20 DIAGNOSIS — E559 Vitamin D deficiency, unspecified: Secondary | ICD-10-CM | POA: Diagnosis not present

## 2016-03-20 DIAGNOSIS — H6122 Impacted cerumen, left ear: Secondary | ICD-10-CM

## 2016-03-20 NOTE — Patient Instructions (Signed)
Try to lose between 1-2 pounds per week by taking in fewer calories and burning off more calories You can succeed by limiting portions, limiting foods dense in calories and fat, becoming more active, and drinking 8 glasses of water a day (64 ounces) Don't skip meals, especially breakfast, as skipping meals may alter your metabolism Do not use over-the-counter weight loss pills or gimmicks that claim rapid weight loss A healthy BMI (or body mass index) is between 18.5 and 24.9 You can calculate your ideal BMI at the NIH website [employer].huhttp://www.nhlbi.nih.gov/health/educational/lose_wt/BMI/bmicalc.htm Try to limit saturated fats in your diet (bologna, hot dogs, barbeque, cheeseburgers, hamburgers, steak, bacon, sausage, cheese, etc.) and get more fresh fruits, vegetables, and whole grains Return in 6 months, fasting labs and visit then

## 2016-03-20 NOTE — Assessment & Plan Note (Signed)
Long time ago had anemia, just recheck

## 2016-03-20 NOTE — Assessment & Plan Note (Addendum)
Fiber and water; last colonoscopy 2008, next due 2018 per patient; no blood or red flags

## 2016-03-20 NOTE — Assessment & Plan Note (Signed)
Weight-bearing activity recommended; fall prevention; calcium and vit D

## 2016-03-20 NOTE — Assessment & Plan Note (Signed)
Continue 1000 iu vit D daily

## 2016-03-20 NOTE — Assessment & Plan Note (Signed)
Check fasting glucose and A1c on another day when she can go fasting; weight loss is key

## 2016-03-20 NOTE — Assessment & Plan Note (Signed)
Well controlled today.

## 2016-03-20 NOTE — Assessment & Plan Note (Signed)
Limit saturated fats; work with nutritionist on weight loss; check lipids

## 2016-03-20 NOTE — Progress Notes (Signed)
BP 122/82 mmHg  Pulse 83  Temp(Src) 98.1 F (36.7 C) (Oral)  Resp 16  Ht 5' (1.524 m)  Wt 177 lb (80.287 kg)  BMI 34.57 kg/m2  SpO2 94%   Subjective:    Patient ID: Cheyenne Parker, female    DOB: 02/07/1946, 70 y.o.   MRN: [id number]  HPI: Cheyenne Parker is a 70 y.o. female  Chief Complaint  Patient presents with  . Follow-up    6 month   . Ear Fullness    wax build up   She thinks she has wax build-up; no trouble hearing Nurse came to her home and we have a form to go over; she was recommended to talk about exercise program; [facility]; no limitations with activity; no chest pain or trouble breathing She is frustrated not being able to lose weight She eats fruit; goes out to eat on occasion; she does much of the cooking; not much bread; no sugary drinks; drinks water; she would be willing to talk to nutritionist; no calorie salad dressing Prediabetes; not many sweets; no blurred vision or dry mouth; just got new glasses Vit D deficiency; taking vit D; activia yogurt and almond milk Hyperlipidemia; limiting bacon and sausage; does like cheese; almond milk, unsweetened BP was up at the home nurse visit, but sister had asked to borrow $2000 so that stressed her; today it's controlled; she checks BP at home and it's okay  Dep[facility] screen [facility] 03/20/2016 09/20/2015  Decreased Interest 0 0  Down, Depressed, Hopeless 0 0  PHQ - 2 Score 0 0   Relevant past medical, surgical, family and social history reviewed and updated as indicated. Interim medical history since our last visit reviewed. Allergies and medications reviewed and updated.  Review of Systems Per HPI unless specifically indicated above     Objective:    BP 122/82 mmHg  Pulse 83  Temp(Src) 98.1 F (36.7 C) (Oral)  Resp 16  Ht 5' (1.524 m)  Wt 177 lb (80.287 kg)  BMI 34.57 kg/m2  SpO2 94%  Wt Readings from Last 3 Encounters:  03/20/16 177 lb (80.287 kg)  02/18/16  176 lb (79.833 kg)  09/20/15 179 lb (81.194 kg)   body mass index is 34.57 kg/(m^2).  Physical Exam  Constitutional: She appears well-developed and well-nourished. No distress.  obese  HENT:  Head: Normocephalic and atraumatic.  Right Ear: Tympanic membrane and ear canal normal.  Mouth/Throat: Oropharynx is clear and moist and mucous membranes are normal.  Large amount of cerumen impacted in left canal; removed under direct visualization with curette; patient tolerate procedure well; intact TM  Eyes: EOM are normal. No scleral icterus.  Neck: No thyromegaly present.  Cardiovascular: Normal rate, regular rhythm and normal heart sounds.   No murmur heard. Pulmonary/Chest: Effort normal and breath sounds normal. No respiratory distress. She has no wheezes.  Abdominal: Soft. Bowel sounds are normal. She exhibits no distension.  Musculoskeletal: Normal range of motion. She exhibits no edema.  Neurological: She is alert. She exhibits normal muscle tone.  Skin: Skin is warm and dry. She is not diaphoretic. No pallor.  Psychiatric: She has a normal mood and affect. Her behavior is normal. Judgment and thought content normal.    Results for orders placed or performed in visit on 09/20/15  Hgb A1c w/o eAG  Result Value Ref Range   Hgb A1c MFr Bld 5.9 (H) 4.8 - 5.6 %  Comprehensive metabolic panel  Result Value Ref Range   Glucose 85 65 - 99 mg/dL   BUN 9 8 - 27 mg/dL   Creatinine, Ser 4.09 0.57 - 1.00 mg/dL   GFR calc non Af Amer 85 >59 mL/min/1.73   GFR calc Af Amer 98 >59 mL/min/1.73   BUN/Creatinine Ratio 12 11 - 26   Sodium 143 136 - 144 mmol/L   Potassium 4.7 3.5 - 5.2 mmol/L   Chloride 102 97 - 106 mmol/L   CO2 25 18 - 29 mmol/L   Calcium 9.5 8.7 - 10.3 mg/dL   Total Protein 6.9 6.0 - 8.5 g/dL   Albumin 4.5 3.6 - 4.8 g/dL   Globulin, Total 2.4 1.5 - 4.5 g/dL   Albumin/Globulin Ratio 1.9 1.1 - 2.5   Bilirubin Total 0.3 0.0 - 1.2 mg/dL   Alkaline Phosphatase 74 39 - 117 IU/L     AST 17 0 - 40 IU/L   ALT 11 0 - 32 IU/L  Lipid Panel w/o Chol/HDL Ratio  Result Value Ref Range   Cholesterol, Total 196 100 - 199 mg/dL   Triglycerides 73 0 - 149 mg/dL   HDL 36 (L) >81 mg/dL   VLDL Cholesterol Cal 15 5 - 40 mg/dL   LDL Calculated 191 (H) 0 - 99 mg/dL  Vit D  25 hydroxy (rtn osteoporosis monitoring)  Result Value Ref Range   Vit D, 25-Hydroxy 40.8 30.0 - 100.0 ng/mL  TSH  Result Value Ref Range   TSH 1.610 0.450 - 4.500 uIU/mL      Assessment & Plan:   Problem List Items Addressed This Visit      Digestive   Constipation    Fiber and water; last colonoscopy 2008, next due 2018 per patient; no blood or red flags        Endocrine   IFG (impaired fasting glucose)    Check fasting glucose and A1c on another day when she can go fasting; weight loss is key      Relevant Orders   Hgb A1c w/o eAG     Musculoskeletal and Integument   Osteoporosis    Weight-bearing activity recommended; fall prevention; calcium and vit D        Other   Vitamin D deficiency disease    Continue 1000 iu vit D daily      Hyperlipidemia - Primary    Limit saturated fats; work with nutritionist on weight loss; check lipids      Relevant Orders   Lipid Panel w/o Chol/HDL Ratio   Anemia    Long time ago had anemia, just recheck      Relevant Orders   CBC with Differential/Platelet   Obesity    Thyroid has been checked; encouragement given for weight loss; see AVS; refer to nutrition      Relevant Orders   Amb ref to Medical Nutrition Therapy-MNT   Comprehensive metabolic panel   Elevated blood pressure    Well-controlled today       Other Visit Diagnoses    Impacted cerumen of left ear        large amount of cerumen removed from left canal by MD under direct visualization with curette; tolerated by pt well       Follow up plan: Return in about 6 months (around 09/19/2016) for fasting labs and visit with Dr. Sherie Don.  An after-visit summary was printed and  given to the patient at check-out.  Please see the patient instructions which may  contain other information and recommendations beyond what is mentioned above in the assessment and plan.  Orders Placed This Encounter  Procedures  . CBC with Differential/Platelet  . Comprehensive metabolic panel  . Lipid Panel w/o Chol/HDL Ratio  . Hgb A1c w/o eAG  . Amb ref to Medical Nutrition Therapy-MNT

## 2016-03-20 NOTE — Assessment & Plan Note (Signed)
Thyroid has been checked; encouragement given for weight loss; see AVS; refer to nutrition

## 2016-07-21 ENCOUNTER — Telehealth: Payer: Self-pay | Admitting: Family Medicine

## 2016-07-21 NOTE — Telephone Encounter (Signed)
Please remind patient that she has outstanding fasting labs and we'd love to have her get those done Thank you

## 2016-07-22 NOTE — Telephone Encounter (Signed)
Left voice mail

## 2016-07-24 ENCOUNTER — Telehealth: Payer: Self-pay | Admitting: Family Medicine

## 2016-07-24 ENCOUNTER — Other Ambulatory Visit: Payer: Self-pay

## 2016-07-24 DIAGNOSIS — D649 Anemia, unspecified: Secondary | ICD-10-CM

## 2016-07-24 DIAGNOSIS — R7301 Impaired fasting glucose: Secondary | ICD-10-CM

## 2016-07-24 DIAGNOSIS — E669 Obesity, unspecified: Secondary | ICD-10-CM

## 2016-07-24 DIAGNOSIS — E785 Hyperlipidemia, unspecified: Secondary | ICD-10-CM

## 2016-07-24 NOTE — Telephone Encounter (Signed)
Ordered and up front

## 2016-08-28 ENCOUNTER — Ambulatory Visit (INDEPENDENT_AMBULATORY_CARE_PROVIDER_SITE_OTHER): Payer: Medicare Other

## 2016-08-28 ENCOUNTER — Encounter: Payer: Self-pay | Admitting: Podiatry

## 2016-08-28 ENCOUNTER — Ambulatory Visit (INDEPENDENT_AMBULATORY_CARE_PROVIDER_SITE_OTHER): Payer: Medicare Other | Admitting: Podiatry

## 2016-08-28 DIAGNOSIS — M79671 Pain in right foot: Secondary | ICD-10-CM

## 2016-08-28 DIAGNOSIS — M659 Synovitis and tenosynovitis, unspecified: Secondary | ICD-10-CM | POA: Diagnosis not present

## 2016-08-28 DIAGNOSIS — M778 Other enthesopathies, not elsewhere classified: Secondary | ICD-10-CM

## 2016-08-28 DIAGNOSIS — M7751 Other enthesopathy of right foot: Secondary | ICD-10-CM | POA: Diagnosis not present

## 2016-08-28 DIAGNOSIS — M25571 Pain in right ankle and joints of right foot: Secondary | ICD-10-CM

## 2016-08-28 DIAGNOSIS — M779 Enthesopathy, unspecified: Secondary | ICD-10-CM

## 2016-08-29 MED ORDER — BETAMETHASONE SOD PHOS & ACET 6 (3-3) MG/ML IJ SUSP
12.0000 mg | Freq: Once | INTRAMUSCULAR | Status: DC
Start: 2016-08-29 — End: 2017-04-28

## 2016-08-29 NOTE — Progress Notes (Signed)
Subjective:  Patient presents today for pain and tenderness to the right foot and ankle. Patient denies trauma. Patient states the pains been going on for several weeks now. Patient states that she has been increasing her walking and she went to the beach this past week. After her trip to the beach she started to notice a significant pain. Patient presents today for further treatment and evaluation    Objective/Physical Exam General: The patient is alert and oriented x3 in no acute distress.  Dermatology: Skin is warm, dry and supple bilateral lower extremities. Negative for open lesions or macerations.  Vascular: Palpable pedal pulses bilaterally. No edema or erythema noted. Capillary refill within normal limits.  Neurological: Epicritic and protective threshold grossly intact bilaterally.   Musculoskeletal Exam: Range of motion within normal limits to all pedal and ankle joints bilateral. Muscle strength 5/5 in all groups bilateral.  Pain on palpation to the sinus tarsi of the right foot. Pain with inversion and eversion of the subtalar joint. Also moderate pain on palpation to the right ankle joint both medial lateral anterior aspects.  Radiographic Exam:  Normal osseous mineralization. Joint spaces preserved. No fracture/dislocation/boney destruction.    Assessment: #1 sinus tarsitis right foot 2. Ankle joint synovitis right 3. Pain in right foot 4. Pain in right ankle   Plan of Care:  #1 Patient was evaluated. #2 today injection of 0.5 mL Celestone Soluspan injected in the patient's right sinus tarsi. #3 ankle brace was dispensed today. #4 patient is to return to clinic in 4 weeks   Dr. Felecia ShellingBrent M. Radhika Dershem, DPM [facility]

## 2016-08-31 ENCOUNTER — Other Ambulatory Visit: Payer: Self-pay | Admitting: Family Medicine

## 2016-08-31 LAB — COMPLETE METABOLIC PANEL WITH GFR
ALT: 10 U/L (ref 6–29)
AST: 15 U/L (ref 10–35)
Albumin: 4 g/dL (ref 3.6–5.1)
Alkaline Phosphatase: 55 U/L (ref 33–130)
BILIRUBIN TOTAL: 0.5 mg/dL (ref 0.2–1.2)
BUN: 14 mg/dL (ref 7–25)
CHLORIDE: 106 mmol/L (ref 98–110)
CO2: 25 mmol/L (ref 20–31)
Calcium: 9.3 mg/dL (ref 8.6–10.4)
Creat: 0.74 mg/dL (ref 0.50–0.99)
GFR, EST NON AFRICAN AMERICAN: 83 mL/min (ref >=60)
GLUCOSE: 89 mg/dL (ref 65–99)
POTASSIUM: 4.5 mmol/L (ref 3.5–5.3)
SODIUM: 142 mmol/L (ref 135–146)
TOTAL PROTEIN: 6.5 g/dL (ref 6.1–8.1)

## 2016-08-31 LAB — LIPID PANEL
CHOL/HDL RATIO: 5.8 ratio — AB (ref <=5.0)
Cholesterol: 173 mg/dL (ref 125–200)
HDL: 30 mg/dL — AB (ref >=46)
LDL Cholesterol: 126 mg/dL (ref <130)
Triglycerides: 85 mg/dL (ref <150)
VLDL: 17 mg/dL (ref <30)

## 2016-08-31 LAB — CBC WITH DIFFERENTIAL/PLATELET
BASOS ABS: 66 {cells}/uL (ref 0–200)
BASOS PCT: 1 %
EOS ABS: 132 {cells}/uL (ref 15–500)
Eosinophils Relative: 2 %
HEMATOCRIT: 35.2 % (ref 35.0–45.0)
HEMOGLOBIN: 11.4 g/dL — AB (ref 11.7–15.5)
LYMPHS ABS: 2508 {cells}/uL (ref 850–3900)
Lymphocytes Relative: 38 %
MCH: 30.4 pg (ref 27.0–33.0)
MCHC: 32.4 g/dL (ref 32.0–36.0)
MCV: 93.9 fL (ref 80.0–100.0)
MONO ABS: 660 {cells}/uL (ref 200–950)
MPV: 9.9 fL (ref 7.5–12.5)
Monocytes Relative: 10 %
NEUTROS ABS: 3234 {cells}/uL (ref 1500–7800)
Neutrophils Relative %: 49 %
PLATELETS: 199 10*3/uL (ref 140–400)
RBC: 3.75 MIL/uL — ABNORMAL LOW (ref 3.80–5.10)
RDW: 13.6 % (ref 11.0–15.0)
WBC: 6.6 10*3/uL (ref 3.8–10.8)

## 2016-09-01 LAB — HEMOGLOBIN A1C
HEMOGLOBIN A1C: 5.5 % (ref <5.7)
MEAN PLASMA GLUCOSE: 111 mg/dL

## 2016-09-02 ENCOUNTER — Other Ambulatory Visit: Payer: Self-pay | Admitting: Family Medicine

## 2016-09-02 DIAGNOSIS — D649 Anemia, unspecified: Secondary | ICD-10-CM

## 2016-09-02 NOTE — Progress Notes (Signed)
Entered referral for colonoscopy

## 2016-09-02 NOTE — Assessment & Plan Note (Signed)
Mild anemia; refer to GI for colonoscopy

## 2016-09-21 ENCOUNTER — Encounter: Payer: Self-pay | Admitting: Family Medicine

## 2016-09-21 ENCOUNTER — Ambulatory Visit (INDEPENDENT_AMBULATORY_CARE_PROVIDER_SITE_OTHER): Payer: Medicare Other | Admitting: Family Medicine

## 2016-09-21 VITALS — BP 136/78 | HR 76 | Temp 97.6°F | Resp 14 | Wt 161.0 lb

## 2016-09-21 DIAGNOSIS — Z0000 Encounter for general adult medical examination without abnormal findings: Secondary | ICD-10-CM

## 2016-09-21 DIAGNOSIS — M810 Age-related osteoporosis without current pathological fracture: Secondary | ICD-10-CM

## 2016-09-21 DIAGNOSIS — Z23 Encounter for immunization: Secondary | ICD-10-CM | POA: Diagnosis not present

## 2016-09-21 NOTE — Patient Instructions (Addendum)
Please do call to schedule your bone density study; the number to schedule one at either [facility] or [facility] is [phone]  You are due for a mammogram in December You will be due for a colonoscopy in 2018 We'll have you return for fasting labs with hepatitis C screening on April 10th or just AFTER  Health Maintenance, Female Adopting a healthy lifestyle and getting preventive care can go a long way to promote health and wellness. Talk with your health care provider about what schedule of regular examinations is right for you. This is a good chance for you to check in with your provider about disease prevention and staying healthy. In between checkups, there are plenty of things you can do on your own. Experts have done a lot of research about which lifestyle changes and preventive measures are most likely to keep you healthy. Ask your health care provider for more information. WEIGHT AND DIET  Eat a healthy diet  Be sure to include plenty of vegetables, fruits, low-fat dairy products, and lean protein.  Do not eat a lot of foods high in solid fats, added sugars, or salt.  Get regular exercise. This is one of the most important things you can do for your health.  Most adults should exercise for at least 150 minutes each week. The exercise should increase your heart rate and make you sweat (moderate-intensity exercise).  Most adults should also do strengthening exercises at least twice a week. This is in addition to the moderate-intensity exercise.  Maintain a healthy weight  Body mass index (BMI) is a measurement that can be used to identify possible weight problems. It estimates body fat based on height and weight. Your health care provider can help determine your BMI and help you achieve or maintain a healthy weight.  For females 51 years of age and older:   A BMI below 18.5 is considered underweight.  A BMI of 18.5 to 24.9 is normal.  A BMI of  25 to 29.9 is considered overweight.  A BMI of 30 and above is considered obese.  Watch levels of cholesterol and blood lipids  You should start having your blood tested for lipids and cholesterol at 69 years of age, then have this test every 5 years.  You may need to have your cholesterol levels checked more often if:  Your lipid or cholesterol levels are high.  You are older than 69 years of age.  You are at high risk for heart disease.  CANCER SCREENING   Lung Cancer  Lung cancer screening is recommended for adults 67-79 years old who are at high risk for lung cancer because of a history of smoking.  A yearly low-dose CT scan of the lungs is recommended for people who:  Currently smoke.  Have quit within the past 15 years.  Have at least a 30-pack-year history of smoking. A pack year is smoking an average of one pack of cigarettes a day for 1 year.  Yearly screening should continue until it has been 15 years since you quit.  Yearly screening should stop if you develop a health problem that would prevent you from having lung cancer treatment.  Breast Cancer  Practice breast self-awareness. This means understanding how your breasts normally appear and feel.  It also means doing regular breast self-exams. Let your health care provider know about any changes, no matter how small.  If you are in your 20s or 30s, you should have a  clinical breast exam (CBE) by a health care provider every 1-3 years as part of a regular health exam.  If you are 48 or older, have a CBE every year. Also consider having a breast X-ray (mammogram) every year.  If you have a family history of breast cancer, talk to your health care provider about genetic screening.  If you are at high risk for breast cancer, talk to your health care provider about having an MRI and a mammogram every year.  Breast cancer gene (BRCA) assessment is recommended for women who have family members with BRCA-related  cancers. BRCA-related cancers include:  Breast.  Ovarian.  Tubal.  Peritoneal cancers.  Results of the assessment will determine the need for genetic counseling and BRCA1 and BRCA2 testing. Cervical Cancer Your health care provider may recommend that you be screened regularly for cancer of the pelvic organs (ovaries, uterus, and vagina). This screening involves a pelvic examination, including checking for microscopic changes to the surface of your cervix (Pap test). You may be encouraged to have this screening done every 3 years, beginning at age 70.  For women ages 32-65, health care providers may recommend pelvic exams and Pap testing every 3 years, or they may recommend the Pap and pelvic exam, combined with testing for human papilloma virus (HPV), every 5 years. Some types of HPV increase your risk of cervical cancer. Testing for HPV may also be done on women of any age with unclear Pap test results.  Other health care providers may not recommend any screening for nonpregnant women who are considered low risk for pelvic cancer and who do not have symptoms. Ask your health care provider if a screening pelvic exam is right for you.  If you have had past treatment for cervical cancer or a condition that could lead to cancer, you need Pap tests and screening for cancer for at least 20 years after your treatment. If Pap tests have been discontinued, your risk factors (such as having a new sexual partner) need to be reassessed to determine if screening should resume. Some women have medical problems that increase the chance of getting cervical cancer. In these cases, your health care provider may recommend more frequent screening and Pap tests. Colorectal Cancer  This type of cancer can be detected and often prevented.  Routine colorectal cancer screening usually begins at 69 years of age and continues through 69 years of age.  Your health care provider may recommend screening at an earlier  age if you have risk factors for colon cancer.  Your health care provider may also recommend using home test kits to check for hidden blood in the stool.  A small camera at the end of a tube can be used to examine your colon directly (sigmoidoscopy or colonoscopy). This is done to check for the earliest forms of colorectal cancer.  Routine screening usually begins at age 95.  Direct examination of the colon should be repeated every 5-10 years through 69 years of age. However, you may need to be screened more often if early forms of precancerous polyps or small growths are found. Skin Cancer  Check your skin from head to toe regularly.  Tell your health care provider about any new moles or changes in moles, especially if there is a change in a mole's shape or color.  Also tell your health care provider if you have a mole that is larger than the size of a pencil eraser.  Always use sunscreen. Apply sunscreen liberally  and repeatedly throughout the day.  Protect yourself by wearing long sleeves, pants, a wide-brimmed hat, and sunglasses whenever you are outside. HEART DISEASE, DIABETES, AND HIGH BLOOD PRESSURE   High blood pressure causes heart disease and increases the risk of stroke. High blood pressure is more likely to develop in:  People who have blood pressure in the high end of the normal range (130-139/85-89 mm Hg).  People who are overweight or obese.  People who are African American.  If you are 54-3 years of age, have your blood pressure checked every 3-5 years. If you are 35 years of age or older, have your blood pressure checked every year. You should have your blood pressure measured twice--once when you are at a hospital or clinic, and once when you are not at a hospital or clinic. Record the average of the two measurements. To check your blood pressure when you are not at a hospital or clinic, you can use:  An automated blood pressure machine at a pharmacy.  A home  blood pressure monitor.  If you are between 65 years and 91 years old, ask your health care provider if you should take aspirin to prevent strokes.  Have regular diabetes screenings. This involves taking a blood sample to check your fasting blood sugar level.  If you are at a normal weight and have a low risk for diabetes, have this test once every three years after 69 years of age.  If you are overweight and have a high risk for diabetes, consider being tested at a younger age or more often. PREVENTING INFECTION  Hepatitis B  If you have a higher risk for hepatitis B, you should be screened for this virus. You are considered at high risk for hepatitis B if:  You were born in a country where hepatitis B is common. Ask your health care provider which countries are considered high risk.  Your parents were born in a high-risk country, and you have not been immunized against hepatitis B (hepatitis B vaccine).  You have HIV or AIDS.  You use needles to inject street drugs.  You live with someone who has hepatitis B.  You have had sex with someone who has hepatitis B.  You get hemodialysis treatment.  You take certain medicines for conditions, including cancer, organ transplantation, and autoimmune conditions. Hepatitis C  Blood testing is recommended for:  Everyone born from [birth year] through 1965.  Anyone with known risk factors for hepatitis C. Sexually transmitted infections (STIs)  You should be screened for sexually transmitted infections (STIs) including gonorrhea and chlamydia if:  You are sexually active and are younger than 69 years of age.  You are older than 69 years of age and your health care provider tells you that you are at risk for this type of infection.  Your sexual activity has changed since you were last screened and you are at an increased risk for chlamydia or gonorrhea. Ask your health care provider if you are at risk.  If you do not have HIV, but are at  risk, it may be recommended that you take a prescription medicine daily to prevent HIV infection. This is called pre-exposure prophylaxis (PrEP). You are considered at risk if:  You are sexually active and do not regularly use condoms or know the HIV status of your partner(s).  You take drugs by injection.  You are sexually active with a partner who has HIV. Talk with your health care provider about whether you are  at high risk of being infected with HIV. If you choose to begin PrEP, you should first be tested for HIV. You should then be tested every 3 months for as long as you are taking PrEP.  PREGNANCY   If you are premenopausal and you may become pregnant, ask your health care provider about preconception counseling.  If you may become pregnant, take 400 to 800 micrograms (mcg) of folic acid every day.  If you want to prevent pregnancy, talk to your health care provider about birth control (contraception). OSTEOPOROSIS AND MENOPAUSE   Osteoporosis is a disease in which the bones lose minerals and strength with aging. This can result in serious bone fractures. Your risk for osteoporosis can be identified using a bone density scan.  If you are 40 years of age or older, or if you are at risk for osteoporosis and fractures, ask your health care provider if you should be screened.  Ask your health care provider whether you should take a calcium or vitamin D supplement to lower your risk for osteoporosis.  Menopause may have certain physical symptoms and risks.  Hormone replacement therapy may reduce some of these symptoms and risks. Talk to your health care provider about whether hormone replacement therapy is right for you.  HOME CARE INSTRUCTIONS   Schedule regular health, dental, and eye exams.  Stay current with your immunizations.   Do not use any tobacco products including cigarettes, chewing tobacco, or electronic cigarettes.  If you are pregnant, do not drink alcohol.  If  you are breastfeeding, limit how much and how often you drink alcohol.  Limit alcohol intake to no more than 1 drink per day for nonpregnant women. One drink equals 12 ounces of beer, 5 ounces of wine, or 1 ounces of hard liquor.  Do not use street drugs.  Do not share needles.  Ask your health care provider for help if you need support or information about quitting drugs.  Tell your health care provider if you often feel depressed.  Tell your health care provider if you have ever been abused or do not feel safe at home.   This information is not intended to replace advice given to you by your health care provider. Make sure you discuss any questions you have with your health care provider.   Document Released: 05/25/2011 Document Revised: 11/30/2014 Document Reviewed: 10/11/2013 Elsevier Interactive Patient Education [employer].

## 2016-09-21 NOTE — Assessment & Plan Note (Signed)
USPSTF grade A and B recommendations reviewed with patient; age-appropriate recommendations, preventive care, screening tests, etc discussed and encouraged; healthy living encouraged; see AVS for patient education given to patient  

## 2016-09-21 NOTE — Progress Notes (Signed)
  Patient ID: Cheyenne Parker, female   DOB: 04/22/1946, 70 y.o.   MRN: [id number]   Subjective:   Cheyenne Parker is a 70 y.o. female here for a complete physical exam  Interim issues since last visit:  Past Medical History:  Diagnosis Date  . Anemia   . Hyperlipidemia   . IFG (impaired fasting glucose)   . Obesity   . Osteoporosis   . Vitamin D deficiency disease    Past Surgical History:  Procedure Laterality Date  . ABDOMINAL HYSTERECTOMY     complete  . FRACTURE SURGERY     Family History  Problem Relation Age of Onset  . Hypertension Mother   . Stroke Mother     possibly  . Kidney disease Father   . Diabetes Father   . Heart disease Father   . Hypertension Father    Social History  Substance Use Topics  . Smoking status: Never Smoker  . Smokeless tobacco: Never Used  . Alcohol use Yes     Comment: socially   Review of Systems  Objective:   Vitals:   09/21/16 0827  BP: 136/78  Pulse: 76  Resp: 14  Temp: 97.6 F (36.4 C)  TempSrc: Oral  SpO2: 96%  Weight: 161 lb (73 kg)   Body mass index is 31.44 kg/m. Wt Readings from Last 3 Encounters:  09/21/16 161 lb (73 kg)  03/20/16 177 lb (80.3 kg)  02/18/16 176 lb (79.8 kg)   Physical Exam  Assessment/Plan:   Problem List Items Addressed This Visit    None    Visit Diagnoses    Needs flu shot    -  Primary   Relevant Orders   Flu vaccine HIGH DOSE PF (Fluzone High dose)       No orders of the defined types were placed in this encounter.  Orders Placed This Encounter  Procedures  . Flu vaccine HIGH DOSE PF (Fluzone High dose)    Follow up plan: No Follow-up on file.  An After Visit Summary was printed and given to the patient.

## 2016-09-21 NOTE — Progress Notes (Signed)
Patient: Cheyenne Parker, Female    DOB: [birth year], 70 y.o.   MRN: [id number]  Visit Date: 09/21/2016  Today's Provider: Baruch Gouty, MD   Chief Complaint  Patient presents with  . Annual Exam    medicare wellness   Subjective:   Cheyenne Parker is a 70 y.o. female who presents today for her Subsequent Annual Wellness Visit.  Caregiver input: n/a   USPSTF grade A and B recommendations Alcohol: occasional glass of wine Depression:  Depression screen [facility] 2/9 09/21/2016 03/20/2016 09/20/2015  Decreased Interest 0 0 0  Down, Depressed, Hopeless 0 0 0  PHQ - 2 Score 0 0 0   Hypertension: well-controlled Obesity: losing weight, joined [facility]; aiming for 140 pounds Tobacco use: never HIV, hep B, hep C: next lab draw STD testing and prevention (chl/gon/syphilis): no Lipids: next visit Glucose: next visit Colorectal cancer: 2008, due in 2018 Breast cancer: Dec 2016; UTD; every 1-2 years Intimate partner violence: no abuse Cervical cancer screening: s/p hysterectomy Lung cancer: n/a Osteoporosis: due Fall prevention/vitamin D: discussed AAA: n/a Aspirin: not taking every day, easily bleeds; discussed guidelines Diet: weight watchers Exercise: yes Skin cancer: goes to dermatologist, sees them yearly; has had treatment; fair complected  HPI  Review of Systems  Past Medical History:  Diagnosis Date  . Anemia   . Hyperlipidemia   . IFG (impaired fasting glucose)   . Obesity   . Osteoporosis   . Vitamin D deficiency disease    Past Surgical History:  Procedure Laterality Date  . ABDOMINAL HYSTERECTOMY     complete  . FRACTURE SURGERY      Family History  Problem Relation Age of Onset  . Hypertension Mother   . Stroke Mother     possibly  . Kidney disease Father   . Diabetes Father   . Heart disease Father   . Hypertension Father    Social History   Social History  . Marital status: Married    Spouse name: N/A  . Number of children:  N/A  . Years of education: N/A   Occupational History  . Not on file.   Social History Main Topics  . Smoking status: Never Smoker  . Smokeless tobacco: Never Used  . Alcohol use Yes     Comment: socially  . Drug use: No  . Sexual activity: Not on file   Other Topics Concern  . Not on file   Social History Narrative  . No narrative on file    Outpatient Encounter Prescriptions as of 09/21/2016  Medication Sig  . [DISCONTINUED] NONFORMULARY OR COMPOUNDED ITEM [facility] T[facility]on[facility] 2 refills   Facility-Administered Encounter Medications as of 09/21/2016  Medication  . betamethasone acetate-betamethasone sodium phosphate (CELESTONE) injection 12 mg    Functional Ability / Safety Screening 1.  Was the timed Get Up and Go test longer than 30 seconds?  no 2.  Does the patient need help with the phone, transportation, shopping,      preparing meals, housework, laundry, medications, or managing money?  no 3.  Does the patient's home have:  loose throw rugs in the hallway?   no      Grab bars in the bathroom? no      Handrails on the stairs?   yes      Poor lighting?   no 4.  Has the patient noticed any hearing difficulties?   no  Fall Risk Assessment See under rooming  Depression  Screen See under rooming [facility] 2[facility] 09/21/2016 03/20/2016 09/20/2015  Decreased Interest 0 0 0  Down, Depressed, Hopeless 0 0 0  PHQ - 2 Score 0 0 0    Advanced Directives Does patient have a HCPOA?    yes If yes, name and contact information: Renee RivalFrank Catalina, same home number Does patient have a living will or MOST form?  yes  Does not want to be on life support unless it's something temporary; full code if collapse, attempt resuscitation; does not want to kept alive on machines  Objective:   Vitals: BP 136/78   Pulse 76   Temp 97.6 F (36.4 C) (Oral)   Resp 14   Wt 161 lb (73 kg)   SpO2 96%   BMI 31.44 kg/m  Body mass index is 31.44 kg/m. No  exam data present  Physical Exam Mood/affect:  euthymic Appearance:  Casually dressed  Cognitive Testing - 6-CIT  Correct? Score   What year is it? yes 0 Yes = 0    No = 4  What month is it? yes 0 Yes = 0    No = 3  Remember:     Everlene BallsJane Hyneman, [address] [place], [place]     What time is it? yes 0 Yes = 0    No = 3  Count backwards from 20 to 1 yes 0 Correct = 0    1 error = 2   More than 1 error = 4  Say the months of the year in reverse. yes 0 Correct = 0    1 error = 2   More than 1 error = 4  What address did I ask you to remember? yes 0 Correct = 0  1 error = 2    2 error = 4    3 error = 6    4 error = 8    All wrong = 10       TOTAL SCORE  0/28   Interpretation:  Normal  Normal (0-7) Abnormal (8-28)   Assessment & Plan:     Annual Wellness Visit  Reviewed patient's Family Medical History Reviewed and updated list of patient's medical providers Assessment of cognitive impairment was done Assessed patient's functional ability Established a written schedule for health screening services Health Risk Assessent Completed and Reviewed  Exercise Activities and Dietary recommendations Keep working on weight loss; goal 140 pounds  Immunization History  Administered Date(s) Administered  . Influenza, High Dose Seasonal PF 09/21/2016  . Influenza,inj,Quad PF,36+ Mos 09/20/2015  . Influenza-Unspecified 08/15/2014  . Pneumococcal Conjugate-13 08/08/2014  . Pneumococcal Polysaccharide-23 11/23/2012  . Zoster 11/23/2013    Health Maintenance  Topic Date Due  . Hepatitis C Screening  [birth year]  . TETANUS/TDAP  01/23/1966  . COLONOSCOPY  11/23/2016  . MAMMOGRAM  11/11/2017  . INFLUENZA VACCINE  Completed  . DEXA SCAN  Completed  . ZOSTAVAX  Completed  . PNA vac Low Risk Adult  Completed     Discussed health benefits of physical activity, and encouraged her to engage in regular exercise appropriate for her age and condition.   No orders of the defined types were placed in  this encounter.  No current outpatient prescriptions on file.  Current Facility-Administered Medications:  .  betamethasone acetate-betamethasone sodium phosphate (CELESTONE) injection 12 mg, 12 mg, Intramuscular, Once, Felecia ShellingBrent M Evans, DPM Medications Discontinued During This Encounter  Medication Reason  . NONFORMULARY OR COMPOUNDED ITEM    Next Medicare Wellness  Visit in 12+ months  Problem List Items Addressed This Visit      Musculoskeletal and Integument   Osteoporosis    Order DEXA scan; fall precautions; get three servings of calcium and daily vit D; weight bearing exercise        Other   Preventative health care - Primary    USPSTF grade A and B recommendations reviewed with patient; age-appropriate recommendations, preventive care, screening tests, etc discussed and encouraged; healthy living encouraged; see AVS for patient education given to patient       Other Visit Diagnoses    Needs flu shot       Relevant Orders   Flu vaccine HIGH DOSE PF (Fluzone High dose) (Completed)

## 2016-09-21 NOTE — Assessment & Plan Note (Signed)
Order DEXA scan; fall precautions; get three servings of calcium and daily vit D; weight bearing exercise

## 2016-09-25 ENCOUNTER — Ambulatory Visit (INDEPENDENT_AMBULATORY_CARE_PROVIDER_SITE_OTHER): Payer: Medicare Other | Admitting: Podiatry

## 2016-09-25 DIAGNOSIS — M659 Synovitis and tenosynovitis, unspecified: Secondary | ICD-10-CM

## 2016-09-25 DIAGNOSIS — M25571 Pain in right ankle and joints of right foot: Secondary | ICD-10-CM | POA: Diagnosis not present

## 2016-09-26 NOTE — Progress Notes (Signed)
Subjective:  Patient presents today for follow-up evaluation of right foot and ankle pain. Patient states that she's doing much better. She does not pain and she used to. She does believe that the injections did help last visit.    Objective/Physical Exam General: The patient is alert and oriented x3 in no acute distress.  Dermatology: Skin is warm, dry and supple bilateral lower extremities. Negative for open lesions or macerations.  Vascular: Palpable pedal pulses bilaterally. No edema or erythema noted. Capillary refill within normal limits.  Neurological: Epicritic and protective threshold grossly intact bilaterally.   Musculoskeletal Exam: Range of motion within normal limits to all pedal and ankle joints bilateral. Muscle strength 5/5 in all groups bilateral.   Assessment: #1 sinus tarsitis right foot-healed  #2 ankle joint synovitis right-healed  Plan of Care:  #1 Patient was evaluated. #2 recommend continuing ankle brace and anti-inflammatory pain cream #3 patient is to return to clinic as needed   Dr. Felecia ShellingBrent M. Evans, DPM [facility]

## 2016-10-23 DEATH — deceased

## 2016-10-30 LAB — HM MAMMOGRAPHY: HM MAMMO: NORMAL (ref 0–4)

## 2016-11-04 ENCOUNTER — Telehealth: Payer: Self-pay | Admitting: Family Medicine

## 2016-11-04 NOTE — Telephone Encounter (Signed)
Please let pt know that her bone scan shows osteoporosis and I'd like to see her in the office to discuss treatment options In the meantime, encourage her to practice safe fall precautions (don't get up on step stools or chairs or ladders decorating her house for the holidays, for example) Thank you

## 2016-11-06 NOTE — Telephone Encounter (Signed)
Cheyenne LoserRhonda spoke with patient and scheduled her an appointment for January.

## 2016-11-18 ENCOUNTER — Encounter: Payer: Self-pay | Admitting: Family Medicine

## 2016-11-24 ENCOUNTER — Telehealth: Payer: Self-pay | Admitting: Family Medicine

## 2016-11-24 ENCOUNTER — Encounter: Payer: Self-pay | Admitting: Family Medicine

## 2016-11-24 MED ORDER — ALENDRONATE SODIUM 70 MG PO TABS: 70.0000 mg | ORAL_TABLET | ORAL | 11 refills | Status: DC

## 2016-11-24 NOTE — Telephone Encounter (Signed)
Please let pt know that her bone density test shows osteoporosis at the spine and femoral neck (skinny part of the bone in the hip); I'll suggest that she start fosamax, once a week; drink with full glass of water; don't lay back down for 30 minutes after taking (sit or stand or walk around, but don't go back to bed) Next bone density in two years

## 2016-11-24 NOTE — Telephone Encounter (Signed)
Patient states has already been through medicine for this and seen speacilist and does not want anymore meds.  Has an appt on Thursday of this week can discuss more then.

## 2016-11-26 ENCOUNTER — Ambulatory Visit: Payer: Medicare Other | Admitting: Family Medicine

## 2016-11-30 ENCOUNTER — Ambulatory Visit (INDEPENDENT_AMBULATORY_CARE_PROVIDER_SITE_OTHER): Payer: Medicare Other | Admitting: Family Medicine

## 2016-11-30 ENCOUNTER — Encounter: Payer: Self-pay | Admitting: Family Medicine

## 2016-11-30 VITALS — BP 118/64 | HR 66 | Temp 98.0°F | Resp 16 | Wt 170.0 lb

## 2016-11-30 DIAGNOSIS — J0100 Acute maxillary sinusitis, unspecified: Secondary | ICD-10-CM

## 2016-11-30 DIAGNOSIS — M810 Age-related osteoporosis without current pathological fracture: Secondary | ICD-10-CM | POA: Diagnosis not present

## 2016-11-30 DIAGNOSIS — R059 Cough, unspecified: Secondary | ICD-10-CM

## 2016-11-30 DIAGNOSIS — R05 Cough: Secondary | ICD-10-CM

## 2016-11-30 MED ORDER — PREDNISONE 10 MG PO TABS: 30.0000 mg | ORAL_TABLET | Freq: Every day | ORAL | 0 refills | Status: AC

## 2016-11-30 MED ORDER — AMOXICILLIN-POT CLAVULANATE 875-125 MG PO TABS: 1.0000 | ORAL_TABLET | Freq: Two times a day (BID) | ORAL | 0 refills | Status: AC

## 2016-11-30 MED ORDER — RALOXIFENE HCL 60 MG PO TABS: 60.0000 mg | ORAL_TABLET | Freq: Every day | ORAL | 3 refills | Status: DC

## 2016-11-30 NOTE — Patient Instructions (Addendum)
Try vitamin C (orange juice if not diabetic or vitamin C tablets) and drink green tea to help your immune system during your illness Get plenty of rest and hydration Make sure you are getting 1,000 iu of vitamin D3 total per day Try to get 1,200 mg of calcium daily, divided into two or three doses Start medicine for current illness Please do eat yogurt daily or take a probiotic daily for the next month or two We want to replace the healthy germs in the gut If you notice foul, watery diarrhea in the next two months, schedule an appointment RIGHT AWAY Start the Evista Watch for any signs/symptoms of blood clot and seek help immediately Do calf exercises if sitting in a car or on an airplane, 10 x an hour, or wear compression stockings   Fall Prevention in the Home Introduction Falls can cause injuries. They can happen to people of all ages. There are many things you can do to make your home safe and to help prevent falls. What can I do on the outside of my home?  Regularly fix the edges of walkways and driveways and fix any cracks.  Remove anything that might make you trip as you walk through a door, such as a raised step or threshold.  Trim any bushes or trees on the path to your home.  Use bright outdoor lighting.  Clear any walking paths of anything that might make someone trip, such as rocks or tools.  Regularly check to see if handrails are loose or broken. Make sure that both sides of any steps have handrails.  Any raised decks and porches should have guardrails on the edges.  Have any leaves, snow, or ice cleared regularly.  Use sand or salt on walking paths during winter.  Clean up any spills in your garage right away. This includes oil or grease spills. What can I do in the bathroom?  Use night lights.  Install grab bars by the toilet and in the tub and shower. Do not use towel bars as grab bars.  Use non-skid mats or decals in the tub or shower.  If you need to  sit down in the shower, use a plastic, non-slip stool.  Keep the floor dry. Clean up any water that spills on the floor as soon as it happens.  Remove soap buildup in the tub or shower regularly.  Attach bath mats securely with double-sided non-slip rug tape.  Do not have throw rugs and other things on the floor that can make you trip. What can I do in the bedroom?  Use night lights.  Make sure that you have a light by your bed that is easy to reach.  Do not use any sheets or blankets that are too big for your bed. They should not hang down onto the floor.  Have a firm chair that has side arms. You can use this for support while you get dressed.  Do not have throw rugs and other things on the floor that can make you trip. What can I do in the kitchen?  Clean up any spills right away.  Avoid walking on wet floors.  Keep items that you use a lot in easy-to-reach places.  If you need to reach something above you, use a strong step stool that has a grab bar.  Keep electrical cords out of the way.  Do not use floor polish or wax that makes floors slippery. If you must use wax, use non-skid floor  wax.  Do not have throw rugs and other things on the floor that can make you trip. What can I do with my stairs?  Do not leave any items on the stairs.  Make sure that there are handrails on both sides of the stairs and use them. Fix handrails that are broken or loose. Make sure that handrails are as long as the stairways.  Check any carpeting to make sure that it is firmly attached to the stairs. Fix any carpet that is loose or worn.  Avoid having throw rugs at the top or bottom of the stairs. If you do have throw rugs, attach them to the floor with carpet tape.  Make sure that you have a light switch at the top of the stairs and the bottom of the stairs. If you do not have them, ask someone to add them for you. What else can I do to help prevent falls?  Wear shoes that:  Do not  have high heels.  Have rubber bottoms.  Are comfortable and fit you well.  Are closed at the toe. Do not wear sandals.  If you use a stepladder:  Make sure that it is fully opened. Do not climb a closed stepladder.  Make sure that both sides of the stepladder are locked into place.  Ask someone to hold it for you, if possible.  Clearly mark and make sure that you can see:  Any grab bars or handrails.  First and last steps.  Where the edge of each step is.  Use tools that help you move around (mobility aids) if they are needed. These include:  Canes.  Walkers.  Scooters.  Crutches.  Turn on the lights when you go into a dark area. Replace any light bulbs as soon as they burn out.  Set up your furniture so you have a clear path. Avoid moving your furniture around.  If any of your floors are uneven, fix them.  If there are any pets around you, be aware of where they are.  Review your medicines with your doctor. Some medicines can make you feel dizzy. This can increase your chance of falling. Ask your doctor what other things that you can do to help prevent falls. This information is not intended to replace advice given to you by your health care provider. Make sure you discuss any questions you have with your health care provider. Document Released: 09/05/2009 Document Revised: 04/16/2016 Document Reviewed: 12/14/2014  2017 Elsevier  Osteoporosis Introduction Osteoporosis happens when your bones become thinner and weaker. Weak bones can break (fracture) more easily when you slip or fall. Bones most at risk of breaking are in the hip, wrist, and spine. Follow these instructions at home:  Get enough calcium and vitamin D. These nutrients are good for your bones.  Exercise as told by your doctor.  Do not use any tobacco products. This includes cigarettes, chewing tobacco, and electronic cigarettes. If you need help quitting, ask your doctor.  Limit the amount of  alcohol you drink.  Take medicines only as told by your doctor.  Keep all follow-up visits as told by your doctor. This is important.  Take care at home to prevent falls. Some ways to do this are:  Keep rooms well lit and tidy.  Put safety rails on your stairs.  Put a rubber mat in the bathroom and other places that are often wet or slippery. Get help right away if:  You fall.  You hurt yourself.  This information is not intended to replace advice given to you by your health care provider. Make sure you discuss any questions you have with your health care provider. Document Released: 02/01/2012 Document Revised: 04/16/2016 Document Reviewed: 04/19/2014  2017 Elsevier

## 2016-11-30 NOTE — Progress Notes (Signed)
BP 118/64   Pulse 66   Temp 98 F (36.7 C)   Resp 16   Wt 170 lb (77.1 kg)   SpO2 97%   BMI 33.20 kg/m    Subjective:    Patient ID: Cheyenne Parker, female    DOB: 06-04-1946, 71 y.o.   MRN: [id number]  HPI: Cheyenne Parker is a 71 y.o. female  Chief Complaint  Patient presents with  . Osteoporosis    review    Patient is here to discuss the bone scan; she has already been through all of the bone density drugs Bone loss was initially found by Dr. Barnabas Lister years ago; she did the medicine (bisphosphonate) for four years, then did more with Dr. Sheppard Penton; after so many years, she has had about eight years total of the different fosamax type medicine [facility], but not lately with sickeness and weather Went back to drinking whole milk; takes calcium pill liquid pill, drinks a juice glass of whole milk BID She has not figured out the milk amount in the tiny juice glass, and the calcium pill is giving her 600 mg each time, 1200 mg per day Evista, no hx of DVT; aunt (paternal) had breast cancer Never did prolia -2.5 in femoral neck, -2.7 in the spine  She has also been sick for over 2 weeks; just not going away; her husband has been sick too; she went somewhere where they were smoking and she thinks it might have started there; she tries to avoid cigarette smoke; a few days later, got sick; head was stopped; congested and hoarse; blowing her nose, builds up and blows it out and keeps blowing; no fevers; no rash; some wheezing, husband more than she is wheezing; no travel; she has been trying mucinex and it seemed to help at first, but not shaking it  Depression screen [facility] 2/9 11/30/2016 09/21/2016 03/20/2016 09/20/2015  Decreased Interest 0 0 0 0  Down, Depressed, Hopeless 0 0 0 0  PHQ - 2 Score 0 0 0 0   Relevant past medical, surgical, family and social history reviewed Past Medical History:  Diagnosis Date  . Anemia   . Hyperlipidemia   . IFG (impaired  fasting glucose)   . Obesity   . Osteoporosis   . Vitamin D deficiency disease    Past Surgical History:  Procedure Laterality Date  . ABDOMINAL HYSTERECTOMY     complete  . FRACTURE SURGERY     Family History  Problem Relation Age of Onset  . Hypertension Mother   . Stroke Mother     possibly  . Kidney disease Father   . Diabetes Father   . Heart disease Father   . Hypertension Father    Social History  Substance Use Topics  . Smoking status: Never Smoker  . Smokeless tobacco: Never Used  . Alcohol use Yes     Comment: socially   Interim medical history since last visit reviewed. Allergies and medications reviewed  Review of Systems Per HPI unless specifically indicated above     Objective:    BP 118/64   Pulse 66   Temp 98 F (36.7 C)   Resp 16   Wt 170 lb (77.1 kg)   SpO2 97%   BMI 33.20 kg/m   Wt Readings from Last 3 Encounters:  11/30/16 170 lb (77.1 kg)  09/21/16 161 lb (73 kg)  03/20/16 177 lb (80.3 kg)  Physical Exam  Constitutional: She appears well-developed and well-nourished.  Obese; weight fluctuation noted  HENT:  Right Ear: External ear and ear canal normal.  Left Ear: External ear and ear canal normal.  Nose: Mucosal edema and rhinorrhea present. Right sinus exhibits frontal sinus tenderness. Left sinus exhibits frontal sinus tenderness.  Mouth/Throat: Oropharynx is clear and moist and mucous membranes are normal.  Eyes: EOM are normal. No scleral icterus.  Cardiovascular: Normal rate and regular rhythm.   Pulmonary/Chest: Effort normal and breath sounds normal.  Lymphadenopathy:    She has no cervical adenopathy.  Psychiatric: She has a normal mood and affect. Her behavior is normal.      Assessment & Plan:   Problem List Items Addressed This Visit      Musculoskeletal and Integument   Osteoporosis    Reviewed bone density study in detail; discussed previous treatment; it is not advisable to treat with bisphosphonates for  longer than five years; discussed other options; she wishes to try Evista; no hx of DVT; reasons to stop and seek immediate medical attention reviewed (s/s of DVT, PE); discussed fall prevention, calcium intake, vit D, weight-bearing activity, etc.      Relevant Medications   raloxifene (EVISTA) 60 MG tablet    Other Visit Diagnoses    Acute non-recurrent maxillary sinusitis    -  Primary   start antibiotic; supportive care; call if needed; cautioned about risk of C diff   Relevant Medications   amoxicillin-clavulanate (AUGMENTIN) 875-125 MG tablet   Cough          Follow up plan: No Follow-up on file.  An after-visit summary was printed and given to the patient at check-out.  Please see the patient instructions which may contain other information and recommendations beyond what is mentioned above in the assessment and plan.  Meds ordered this encounter  Medications  . amoxicillin-clavulanate (AUGMENTIN) 875-125 MG tablet    Sig: Take 1 tablet by mouth 2 (two) times daily.    Dispense:  20 tablet    Refill:  0  . predniSONE (DELTASONE) 10 MG tablet    Sig: Take 3 tablets (30 mg total) by mouth daily with breakfast.    Dispense:  15 tablet    Refill:  0  . raloxifene (EVISTA) 60 MG tablet    Sig: Take 1 tablet (60 mg total) by mouth daily.    Dispense:  90 tablet    Refill:  3    No orders of the defined types were placed in this encounter.

## 2016-12-10 NOTE — Assessment & Plan Note (Signed)
Reviewed bone density study in detail; discussed previous treatment; it is not advisable to treat with bisphosphonates for longer than five years; discussed other options; she wishes to try Evista; no hx of DVT; reasons to stop and seek immediate medical attention reviewed (s/s of DVT, PE); discussed fall prevention, calcium intake, vit D, weight-bearing activity, etc.

## 2017-03-02 ENCOUNTER — Ambulatory Visit: Payer: Medicare Other | Admitting: Family Medicine

## 2017-03-03 ENCOUNTER — Ambulatory Visit: Payer: Medicare Other | Admitting: Family Medicine

## 2017-04-28 ENCOUNTER — Ambulatory Visit (INDEPENDENT_AMBULATORY_CARE_PROVIDER_SITE_OTHER): Payer: Medicare Other | Admitting: Family Medicine

## 2017-04-28 ENCOUNTER — Encounter: Payer: Self-pay | Admitting: Family Medicine

## 2017-04-28 VITALS — BP 114/76 | HR 71 | Temp 98.0°F | Resp 16 | Wt 169.8 lb

## 2017-04-28 DIAGNOSIS — D649 Anemia, unspecified: Secondary | ICD-10-CM

## 2017-04-28 DIAGNOSIS — Z1211 Encounter for screening for malignant neoplasm of colon: Secondary | ICD-10-CM

## 2017-04-28 DIAGNOSIS — R7301 Impaired fasting glucose: Secondary | ICD-10-CM | POA: Diagnosis not present

## 2017-04-28 DIAGNOSIS — Z1159 Encounter for screening for other viral diseases: Secondary | ICD-10-CM | POA: Diagnosis not present

## 2017-04-28 DIAGNOSIS — E786 Lipoprotein deficiency: Secondary | ICD-10-CM | POA: Insufficient documentation

## 2017-04-28 DIAGNOSIS — M810 Age-related osteoporosis without current pathological fracture: Secondary | ICD-10-CM

## 2017-04-28 DIAGNOSIS — R030 Elevated blood-pressure reading, without diagnosis of hypertension: Secondary | ICD-10-CM

## 2017-04-28 LAB — LIPID PANEL
CHOLESTEROL: 177 mg/dL (ref <200)
HDL: 34 mg/dL — AB (ref >50)
LDL Cholesterol: 131 mg/dL — ABNORMAL HIGH (ref <100)
Total CHOL/HDL Ratio: 5.2 Ratio — ABNORMAL HIGH (ref <5.0)
Triglycerides: 61 mg/dL (ref <150)
VLDL: 12 mg/dL (ref <30)

## 2017-04-28 LAB — CBC WITH DIFFERENTIAL/PLATELET
BASOS PCT: 0 %
Basophils Absolute: 0 cells/uL (ref 0–200)
EOS PCT: 2 %
Eosinophils Absolute: 108 cells/uL (ref 15–500)
HCT: 38.4 % (ref 35.0–45.0)
HEMOGLOBIN: 12.4 g/dL (ref 11.7–15.5)
LYMPHS ABS: 1944 {cells}/uL (ref 850–3900)
Lymphocytes Relative: 36 %
MCH: 29.7 pg (ref 27.0–33.0)
MCHC: 32.3 g/dL (ref 32.0–36.0)
MCV: 92.1 fL (ref 80.0–100.0)
MONO ABS: 378 {cells}/uL (ref 200–950)
MPV: 10.3 fL (ref 7.5–12.5)
Monocytes Relative: 7 %
NEUTROS ABS: 2970 {cells}/uL (ref 1500–7800)
NEUTROS PCT: 55 %
Platelets: 185 10*3/uL (ref 140–400)
RBC: 4.17 MIL/uL (ref 3.80–5.10)
RDW: 13.9 % (ref 11.0–15.0)
WBC: 5.4 10*3/uL (ref 3.8–10.8)

## 2017-04-28 LAB — HEPATITIS C ANTIBODY: HCV AB: NEGATIVE

## 2017-04-28 NOTE — Assessment & Plan Note (Signed)
Patient never heard about the colonoscopy / GI referral from October; will get stool cards x 3, do Cologuard and check CBC and ferritin today
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
## 2017-04-28 NOTE — Assessment & Plan Note (Signed)
Will check glucose and A1c today; try to work on weight loss, healthier eating

## 2017-04-28 NOTE — Assessment & Plan Note (Signed)
Check lipids; encouraged weight loss

## 2017-04-28 NOTE — Patient Instructions (Addendum)
Try to get 1200 mg of calcium daily, divided up in 2 to 3 servings; best in food or drinks, supplement if needed Take 1,000 iu of vitamin D3 once a day Practice good fall precautions Please return the stool cards at your earliest convenience We'll get labs today If you have not heard anything from my staff in a week about any orders/referrals/studies from today, please contact us here to follow-up [phone]

## 2017-04-28 NOTE — Assessment & Plan Note (Signed)
Well-controlled; previous reading may have been a fluke or related to high sodium intake

## 2017-04-28 NOTE — Assessment & Plan Note (Signed)
Did not tolerate evista; already did five years of bisphosphonates; offered referral to Prolia; avoid falls, practice good fall precautions; 1200 mg calcium daily, 1000 iu vitamin D3 recommended

## 2017-04-29 LAB — FERRITIN: FERRITIN: 27 ng/mL (ref 20–288)

## 2017-04-29 LAB — HEMOGLOBIN A1C
HEMOGLOBIN A1C: 5.6 % (ref <5.7)
Mean Plasma Glucose: 114 mg/dL

## 2017-05-19 LAB — COLOGUARD: Cologuard: NEGATIVE

## 2017-09-23 ENCOUNTER — Encounter: Payer: Self-pay | Admitting: Family Medicine

## 2017-09-23 ENCOUNTER — Ambulatory Visit (INDEPENDENT_AMBULATORY_CARE_PROVIDER_SITE_OTHER): Payer: Medicare Other | Admitting: Family Medicine

## 2017-09-23 VITALS — BP 136/72 | HR 87 | Temp 98.0°F | Resp 14 | Ht 59.13 in | Wt 176.2 lb

## 2017-09-23 DIAGNOSIS — E6609 Other obesity due to excess calories: Secondary | ICD-10-CM

## 2017-09-23 DIAGNOSIS — Z6835 Body mass index (BMI) 35.0-35.9, adult: Secondary | ICD-10-CM | POA: Diagnosis not present

## 2017-09-23 DIAGNOSIS — Z1231 Encounter for screening mammogram for malignant neoplasm of breast: Secondary | ICD-10-CM | POA: Diagnosis not present

## 2017-09-23 DIAGNOSIS — Z1239 Encounter for other screening for malignant neoplasm of breast: Secondary | ICD-10-CM

## 2017-09-23 DIAGNOSIS — Z0000 Encounter for general adult medical examination without abnormal findings: Secondary | ICD-10-CM

## 2017-09-23 DIAGNOSIS — Z23 Encounter for immunization: Secondary | ICD-10-CM | POA: Diagnosis not present

## 2017-09-23 NOTE — Progress Notes (Signed)
Patient: Cheyenne Parker, Female    DOB: Nov 20, 1946, 71 y.o.   MRN: [id number]  Visit Date: 09/23/2017  Today's Provider: Enid Derry, MD   Chief Complaint  Patient presents with  . Medicare Wellness    Subjective:   Cheyenne Parker is a 71 y.o. female who presents today for her Subsequent Annual Wellness Visit.  Caregiver input:  n/a  USPSTF grade A and B recommendations Depression:  Depression screen [facility] 2/9 09/23/2017 04/28/2017 11/30/2016 09/21/2016 03/20/2016  Decreased Interest 0 0 0 0 0  Down, Depressed, Hopeless 0 0 0 0 0  PHQ - 2 Score 0 0 0 0 0   Hypertension: BP Readings from Last 3 Encounters:  09/23/17 136/72  04/28/17 114/76  11/30/16 118/64   Obesity: Wt Readings from Last 3 Encounters:  09/23/17 176 lb 3.2 oz (79.9 kg)  04/28/17 169 lb 12.8 oz (77 kg)  11/30/16 170 lb (77.1 kg)   BMI Readings from Last 3 Encounters:  09/23/17 35.44 kg/m  04/28/17 33.16 kg/m  11/30/16 33.20 kg/m    Skin cancer: several skin tags and keratotic lesions around the neck; see Dr. Evorn Gong Lung cancer:  Never smoker Breast cancer: due mammogram in Dec at [facility] Colorectal cancer: due June 2021  BRCA gene screening: family hx of breast and/or ovarian cancer and/or metastatic prostate cancer? No one to her recollection; one aunt had cancer but not sure what kind Cervical cancer screening: s/p hysterectomy HIV, hep B, hep C: did hep C in June; not interested STD testing and prevention (chl/gon/syphilis): not interested Intimate partner violence: no abuse Osteoporosis: Dec 2017; due Dec 2019 Fall prevention/vitamin D: discussed  Diet: getting calcium, almond milk; getting fruits and veggies [facility], doing [facility]obics Alcohol: socially Tobacco use: never Aspirin: daily, chewable versus coated, varies; no blood in the urine or stool Lipids:  Lab Results  Component Value Date   CHOL 177 04/28/2017   CHOL 173 08/31/2016   CHOL 196 09/20/2015   Lab Results   Component Value Date   HDL 34 (L) 04/28/2017   HDL 30 (L) 08/31/2016   HDL 36 (L) 09/20/2015   Lab Results  Component Value Date   LDLCALC 131 (H) 04/28/2017   LDLCALC 126 08/31/2016   LDLCALC 145 (H) 09/20/2015   Lab Results  Component Value Date   TRIG 61 04/28/2017   TRIG 85 08/31/2016   TRIG 73 09/20/2015   Lab Results  Component Value Date   CHOLHDL 5.2 (H) 04/28/2017   CHOLHDL 5.8 (H) 08/31/2016   No results found for: LDLDIRECT Glucose:  Glucose  Date Value Ref Range Status  09/20/2015 85 65 - 99 mg/dL Final   Glucose, Bld  Date Value Ref Range Status  08/31/2016 89 65 - 99 mg/dL Final   HPI  Review of Systems  HENT:       Teeth have been awful this year; seeing dentist   Past Medical History:  Diagnosis Date  . Anemia   . Hyperlipidemia   . IFG (impaired fasting glucose)   . Obesity   . Osteoporosis   . Vitamin D deficiency disease     Past Surgical History:  Procedure Laterality Date  . ABDOMINAL HYSTERECTOMY     complete  . FRACTURE SURGERY      Family History  Problem Relation Age of Onset  . Hypertension Mother   . Stroke Mother        possibly, died at 49 years of age  .  Kidney disease Father   . Diabetes Father   . Heart disease Father   . Hypertension Father   . Alcohol abuse Father   . Alcohol abuse Daughter   . Memory loss Maternal Grandmother        died at age 52    Social History   Social History  . Marital status: Married    Spouse name: N/A  . Number of children: N/A  . Years of education: N/A   Occupational History  . Not on file.   Social History Main Topics  . Smoking status: Never Smoker  . Smokeless tobacco: Never Used  . Alcohol use Yes     Comment: socially  . Drug use: No  . Sexual activity: Not Currently   Other Topics Concern  . Not on file   Social History Narrative  . No narrative on file    Outpatient Encounter Prescriptions as of 09/23/2017  Medication Sig  . aspirin (ASPIRIN 81) 81  MG chewable tablet Chew 81 mg by mouth daily.  . Biotin 5000 MCG CAPS Take 1 capsule by mouth daily.  . Calcium-Magnesium-Vitamin D (CALCIUM 1200+D3 PO) Take 1 tablet by mouth daily.  . fexofenadine (ALLEGRA) 180 MG tablet Take 180 mg by mouth as needed for allergies or rhinitis.  . Multiple Vitamin (MULTIVITAMIN) tablet Take 1 tablet by mouth daily.  . polyethylene glycol powder (MIRALAX) powder Take 1 Container by mouth as needed.  . Vitamin D, Cholecalciferol, 1000 units CAPS Take 1 capsule by mouth daily.   No facility-administered encounter medications on file as of 09/23/2017.     Functional Ability / Safety Screening 1.  Was the timed Get Up and Go test longer than 30 seconds?  no 2.  Does the patient need help with the phone, transportation, shopping,      preparing meals, housework, laundry, medications, or managing money?  no 3.  Does the patient's home have:  loose throw rugs in the hallway?   no      Grab bars in the bathroom? no      Handrails on the stairs?   yes      Poor lighting?   no 4.  Has the patient noticed any hearing difficulties?   no  Fall Risk Assessment See under rooming  Depression Screen See under rooming Depression screen [facility] 2/9 09/23/2017 04/28/2017 11/30/2016 09/21/2016 03/20/2016  Decreased Interest 0 0 0 0 0  Down, Depressed, Hopeless 0 0 0 0 0  PHQ - 2 Score 0 0 0 0 0   Advanced Directives Does patient have a HCPOA?    yes If yes, name and contact information: Cheyenne Parker, cell [phone] Does patient have a living will or MOST form?  yes, requested copy In a nutshell, patient would not be averse to trying to bring her back if sudden collapse, but does not live indefinitely on machines  Objective:   Vitals: BP 136/72   Pulse 87   Temp 98 F (36.7 C) (Oral)   Resp 14   Ht 4' 11.13" (1.502 m)   Wt 176 lb 3.2 oz (79.9 kg)   SpO2 94%   BMI 35.44 kg/m  Body mass index is 35.44 kg/m. No exam data present  Wt Readings from Last 3  Encounters:  09/23/17 176 lb 3.2 oz (79.9 kg)  04/28/17 169 lb 12.8 oz (77 kg)  11/30/16 170 lb (77.1 kg)   Physical Exam Mood/affect:  euthymic Appearance:  Neatly casually dressed  6CIT Screen  09/23/2017  What Year? 0 points  What month? 0 points  What time? 0 points  Count back from 20 0 points  Months in reverse 0 points  Repeat phrase 0 points  Total Score 0    Assessment & Plan:     Annual Wellness Visit  Reviewed patient's Family Medical History Reviewed and updated list of patient's medical providers Assessment of cognitive impairment was done Assessed patient's functional ability Established a written schedule for health screening [place] Completed and Reviewed  Exercise Activities and Dietary recommendations Goals    . Weight (lb) < 200 lb (90.7 kg)     weight goal is NOT less than 200 pounds, but I cannot change it; see AVS  Immunization History  Administered Date(s) Administered  . Influenza, High Dose Seasonal PF 09/21/2016, 09/23/2017  . Influenza,inj,Quad PF,6+ Mos 09/20/2015  . Influenza-Unspecified 08/15/2014  . Pneumococcal Conjugate-13 08/08/2014  . Pneumococcal Polysaccharide-23 11/23/2012  . Zoster 11/23/2013    Health Maintenance  Topic Date Due  . TETANUS/TDAP  07/12/2018 (Originally 01/23/1966)  . MAMMOGRAM  10/30/2017  . Fecal DNA (Cologuard)  05/12/2020  . INFLUENZA VACCINE  Completed  . DEXA SCAN  Completed  . Hepatitis C Screening  Completed  . PNA vac Low Risk Adult  Completed    Discussed health benefits of physical activity, and encouraged her to engage in regular exercise appropriate for her age and condition.   Meds ordered this encounter  Medications  . Calcium-Magnesium-Vitamin D (CALCIUM 1200+D3 PO)    Sig: Take 1 tablet by mouth daily.  Marland Kitchen aspirin (ASPIRIN 81) 81 MG chewable tablet    Sig: Chew 81 mg by mouth daily.  . fexofenadine (ALLEGRA) 180 MG tablet    Sig: Take 180 mg by mouth as needed  for allergies or rhinitis.  . Biotin 5000 MCG CAPS    Sig: Take 1 capsule by mouth daily.  . Vitamin D, Cholecalciferol, 1000 units CAPS    Sig: Take 1 capsule by mouth daily.  . polyethylene glycol powder (MIRALAX) powder    Sig: Take 1 Container by mouth as needed.    Current Outpatient Prescriptions:  .  aspirin (ASPIRIN 81) 81 MG chewable tablet, Chew 81 mg by mouth daily., Disp: , Rfl:  .  Biotin 5000 MCG CAPS, Take 1 capsule by mouth daily., Disp: , Rfl:  .  Calcium-Magnesium-Vitamin D (CALCIUM 1200+D3 PO), Take 1 tablet by mouth daily., Disp: , Rfl:  .  fexofenadine (ALLEGRA) 180 MG tablet, Take 180 mg by mouth as needed for allergies or rhinitis., Disp: , Rfl:  .  Multiple Vitamin (MULTIVITAMIN) tablet, Take 1 tablet by mouth daily., Disp: , Rfl:  .  polyethylene glycol powder (MIRALAX) powder, Take 1 Container by mouth as needed., Disp: , Rfl:  .  Vitamin D, Cholecalciferol, 1000 units CAPS, Take 1 capsule by mouth daily., Disp: , Rfl:  There are no discontinued medications.  Next Medicare Wellness Visit in 12+ months  Problem List Items Addressed This Visit      Other   Preventative health care - Primary    USPSTF grade A and B recommendations reviewed with patient; age-appropriate recommendations, preventive care, screening tests, etc discussed and encouraged; healthy living encouraged; see AVS for patient education given to patient      Obesity    Reviewed BMI calculator; she'll work on modest weight loss       Other Visit Diagnoses    Needs flu shot  Relevant Orders   Flu vaccine HIGH DOSE PF (Fluzone High dose) (Completed)   Screening for breast cancer       Relevant Orders   MM Digital Screening

## 2017-09-23 NOTE — Assessment & Plan Note (Signed)
USPSTF grade A and B recommendations reviewed with patient; age-appropriate recommendations, preventive care, screening tests, etc discussed and encouraged; healthy living encouraged; see AVS for patient education given to patient  

## 2017-09-23 NOTE — Patient Instructions (Addendum)
Try to reach 148 pounds to get your BMI out of the obese range Please do bring [place] a copy of your living will Consider the new shingles vaccine called Shingrix, a two-part vaccine  Health Maintenance  Topic Date Due  . TETANUS/TDAP  07/12/2018 (Originally 01/23/1965)  . MAMMOGRAM  10/30/2017  . Fecal DNA (Cologuard)  05/12/2020  . INFLUENZA VACCINE  Completed  . DEXA SCAN  Completed  . Hepatitis C Screening  Completed  . PNA vac Low Risk Adult  Completed   Your next DEXA (bone density scan) is due on or after October 31, 2018   Fall Prevention in the [facility] can cause injuries. They can happen to people of all ages. There are many things you can do to make your home safe and to help prevent falls. What can I do on the outside of my home?  Regularly fix the edges of walkways and driveways and fix any cracks.  Remove anything that might make you trip as you walk through a door, such as a raised step or threshold.  Trim any bushes or trees on the path to your home.  Use bright outdoor lighting.  Clear any walking paths of anything that might make someone trip, such as rocks or tools.  Regularly check to see if handrails are loose or broken. Make sure that both sides of any steps have handrails.  Any raised decks and porches should have guardrails on the edges.  Have any leaves, snow, or ice cleared regularly.  Use sand or salt on walking paths during winter.  Clean up any spills in your garage right away. This includes oil or grease spills. What can I do in the bathroom?  Use night lights.  Install grab bars by the toilet and in the tub and shower. Do not use towel bars as grab bars.  Use non-skid mats or decals in the tub or shower.  If you need to sit down in the shower, use a plastic, non-slip stool.  Keep the floor dry. Clean up any water that spills on the floor as soon as it happens.  Remove soap buildup in the tub or shower regularly.  Attach bath mats  securely with double-sided non-slip rug tape.  Do not have throw rugs and other things on the floor that can make you trip. What can I do in the bedroom?  Use night lights.  Make sure that you have a light by your bed that is easy to reach.  Do not use any sheets or blankets that are too big for your bed. They should not hang down onto the floor.  Have a firm chair that has side arms. You can use this for support while you get dressed.  Do not have throw rugs and other things on the floor that can make you trip. What can I do in the kitchen?  Clean up any spills right away.  Avoid walking on wet floors.  Keep items that you use a lot in easy-to-reach places.  If you need to reach something above you, use a strong step stool that has a grab bar.  Keep electrical cords out of the way.  Do not use floor polish or wax that makes floors slippery. If you must use wax, use non-skid floor wax.  Do not have throw rugs and other things on the floor that can make you trip. What can I do with my stairs?  Do not leave any items on the stairs.  Make sure that there are handrails on both sides of the stairs and use them. Fix handrails that are broken or loose. Make sure that handrails are as long as the stairways.  Check any carpeting to make sure that it is firmly attached to the stairs. Fix any carpet that is loose or worn.  Avoid having throw rugs at the top or bottom of the stairs. If you do have throw rugs, attach them to the floor with carpet tape.  Make sure that you have a light switch at the top of the stairs and the bottom of the stairs. If you do not have them, ask someone to add them for you. What else can I do to help prevent falls?  Wear shoes that: ? Do not have high heels. ? Have rubber bottoms. ? Are comfortable and fit you well. ? Are closed at the toe. Do not wear sandals.  If you use a stepladder: ? Make sure that it is fully opened. Do not climb a closed  stepladder. ? Make sure that both sides of the stepladder are locked into place. ? Ask someone to hold it for you, if possible.  Clearly mark and make sure that you can see: ? Any grab bars or handrails. ? First and last steps. ? Where the edge of each step is.  Use tools that help you move around (mobility aids) if they are needed. These include: ? Canes. ? Walkers. ? Scooters. ? Crutches.  Turn on the lights when you go into a dark area. Replace any light bulbs as soon as they burn out.  Set up your furniture so you have a clear path. Avoid moving your furniture around.  If any of your floors are uneven, fix them.  If there are any pets around you, be aware of where they are.  Review your medicines with your doctor. Some medicines can make you feel dizzy. This can increase your chance of falling. Ask your doctor what other things that you can do to help prevent falls. This information is not intended to replace advice given to you by your health care provider. Make sure you discuss any questions you have with your health care provider. Document Released: 09/05/2009 Document Revised: 04/16/2016 Document Reviewed: 12/14/2014 Elsevier Interactive Patient Education  2018 [place] Maintenance, Female Adopting a healthy lifestyle and getting preventive care can go a long way to promote health and wellness. Talk with your health care provider about what schedule of regular examinations is right for you. This is a good chance for you to check in with your provider about disease prevention and staying healthy. In between checkups, there are plenty of things you can do on your own. Experts have done a lot of research about which lifestyle changes and preventive measures are most likely to keep you healthy. Ask your health care provider for more information. Weight and diet Eat a healthy diet  Be sure to include plenty of vegetables, fruits, low-fat dairy products, and lean  protein.  Do not eat a lot of foods high in solid fats, added sugars, or salt.  Get regular exercise. This is one of the most important things you can do for your health. ? Most adults should exercise for at least 150 minutes each week. The exercise should increase your heart rate and make you sweat (moderate-intensity exercise). ? Most adults should also do strengthening exercises at least twice a week. This is in addition to the moderate-intensity exercise.  Maintain  a healthy weight  Body mass index (BMI) is a measurement that can be used to identify possible weight problems. It estimates body fat based on height and weight. Your health care provider can help determine your BMI and help you achieve or maintain a healthy weight.  For females 72 years of age and older: ? A BMI below 18.5 is considered underweight. ? A BMI of 18.5 to 24.9 is normal. ? A BMI of 25 to 29.9 is considered overweight. ? A BMI of 30 and above is considered obese.  Watch levels of cholesterol and blood lipids  You should start having your blood tested for lipids and cholesterol at 70 years of age, then have this test every 5 years.  You may need to have your cholesterol levels checked more often if: ? Your lipid or cholesterol levels are high. ? You are older than 70 years of age. ? You are at high risk for heart disease.  Cancer screening Lung Cancer  Lung cancer screening is recommended for adults 54-63 years old who are at high risk for lung cancer because of a history of smoking.  A yearly low-dose CT scan of the lungs is recommended for people who: ? Currently smoke. ? Have quit within the past 15 years. ? Have at least a 30-pack-year history of smoking. A pack year is smoking an average of one pack of cigarettes a day for 1 year.  Yearly screening should continue until it has been 15 years since you quit.  Yearly screening should stop if you develop a health problem that would prevent you from  having lung cancer treatment.  Breast Cancer  Practice breast self-awareness. This means understanding how your breasts normally appear and feel.  It also means doing regular breast self-exams. Let your health care provider know about any changes, no matter how small.  If you are in your 20s or 30s, you should have a clinical breast exam (CBE) by a health care provider every 1-3 years as part of a regular health exam.  If you are 77 or older, have a CBE every year. Also consider having a breast X-ray (mammogram) every year.  If you have a family history of breast cancer, talk to your health care provider about genetic screening.  If you are at high risk for breast cancer, talk to your health care provider about having an MRI and a mammogram every year.  Breast cancer gene (BRCA) assessment is recommended for women who have family members with BRCA-related cancers. BRCA-related cancers include: ? Breast. ? Ovarian. ? Tubal. ? Peritoneal cancers.  Results of the assessment will determine the need for genetic counseling and BRCA1 and BRCA2 testing.  Cervical Cancer Your health care provider may recommend that you be screened regularly for cancer of the pelvic organs (ovaries, uterus, and vagina). This screening involves a pelvic examination, including checking for microscopic changes to the surface of your cervix (Pap test). You may be encouraged to have this screening done every 3 years, beginning at age 64.  For women ages 33-65, health care providers may recommend pelvic exams and Pap testing every 3 years, or they may recommend the Pap and pelvic exam, combined with testing for human papilloma virus (HPV), every 5 years. Some types of HPV increase your risk of cervical cancer. Testing for HPV may also be done on women of any age with unclear Pap test results.  Other health care providers may not recommend any screening for nonpregnant women who are  considered low risk for pelvic cancer  and who do not have symptoms. Ask your health care provider if a screening pelvic exam is right for you.  If you have had past treatment for cervical cancer or a condition that could lead to cancer, you need Pap tests and screening for cancer for at least 20 years after your treatment. If Pap tests have been discontinued, your risk factors (such as having a new sexual partner) need to be reassessed to determine if screening should resume. Some women have medical problems that increase the chance of getting cervical cancer. In these cases, your health care provider may recommend more frequent screening and Pap tests.  Colorectal Cancer  This type of cancer can be detected and often prevented.  Routine colorectal cancer screening usually begins at 70 years of age and continues through 70 years of age.  Your health care provider may recommend screening at an earlier age if you have risk factors for colon cancer.  Your health care provider may also recommend using home test kits to check for hidden blood in the stool.  A small camera at the end of a tube can be used to examine your colon directly (sigmoidoscopy or colonoscopy). This is done to check for the earliest forms of colorectal cancer.  Routine screening usually begins at age 62.  Direct examination of the colon should be repeated every 5-10 years through 70 years of age. However, you may need to be screened more often if early forms of precancerous polyps or small growths are found.  Skin Cancer  Check your skin from head to toe regularly.  Tell your health care provider about any new moles or changes in moles, especially if there is a change in a mole's shape or color.  Also tell your health care provider if you have a mole that is larger than the size of a pencil eraser.  Always use sunscreen. Apply sunscreen liberally and repeatedly throughout the day.  Protect yourself by wearing long sleeves, pants, a wide-brimmed hat, and  sunglasses whenever you are outside.  Heart disease, diabetes, and high blood pressure  High blood pressure causes heart disease and increases the risk of stroke. High blood pressure is more likely to develop in: ? People who have blood pressure in the high end of the normal range (130-139/85-89 mm Hg). ? People who are overweight or obese. ? People who are African American.  If you are 50-58 years of age, have your blood pressure checked every 3-5 years. If you are 20 years of age or older, have your blood pressure checked every year. You should have your blood pressure measured twice-once when you are at a hospital or clinic, and once when you are not at a hospital or clinic. Record the average of the two measurements. To check your blood pressure when you are not at a hospital or clinic, you can use: ? An automated blood pressure machine at a pharmacy. ? A home blood pressure monitor.  If you are between 29 years and 14 years old, ask your health care provider if you should take aspirin to prevent strokes.  Have regular diabetes screenings. This involves taking a blood sample to check your fasting blood sugar level. ? If you are at a normal weight and have a low risk for diabetes, have this test once every three years after 70 years of age. ? If you are overweight and have a high risk for diabetes, consider being tested at a  younger age or more often. Preventing infection Hepatitis B  If you have a higher risk for hepatitis B, you should be screened for this virus. You are considered at high risk for hepatitis B if: ? You were born in a country where hepatitis B is common. Ask your health care provider which countries are considered high risk. ? Your parents were born in a high-risk country, and you have not been immunized against hepatitis B (hepatitis B vaccine). ? You have HIV or AIDS. ? You use needles to inject street drugs. ? You live with someone who has hepatitis B. ? You have  had sex with someone who has hepatitis B. ? You get hemodialysis treatment. ? You take certain medicines for conditions, including cancer, organ transplantation, and autoimmune conditions.  Hepatitis C  Blood testing is recommended for: ? Everyone born from [birth year] through 1964. ? Anyone with known risk factors for hepatitis C.  Sexually transmitted infections (STIs)  You should be screened for sexually transmitted infections (STIs) including gonorrhea and chlamydia if: ? You are sexually active and are younger than 70 years of age. ? You are older than 70 years of age and your health care provider tells you that you are at risk for this type of infection. ? Your sexual activity has changed since you were last screened and you are at an increased risk for chlamydia or gonorrhea. Ask your health care provider if you are at risk.  If you do not have HIV, but are at risk, it may be recommended that you take a prescription medicine daily to prevent HIV infection. This is called pre-exposure prophylaxis (PrEP). You are considered at risk if: ? You are sexually active and do not regularly use condoms or know the HIV status of your partner(s). ? You take drugs by injection. ? You are sexually active with a partner who has HIV.  Talk with your health care provider about whether you are at high risk of being infected with HIV. If you choose to begin PrEP, you should first be tested for HIV. You should then be tested every 3 months for as long as you are taking PrEP. Pregnancy  If you are premenopausal and you may become pregnant, ask your health care provider about preconception counseling.  If you may become pregnant, take 400 to 800 micrograms (mcg) of folic acid every day.  If you want to prevent pregnancy, talk to your health care provider about birth control (contraception). Osteoporosis and menopause  Osteoporosis is a disease in which the bones lose minerals and strength with aging. This  can result in serious bone fractures. Your risk for osteoporosis can be identified using a bone density scan.  If you are 5 years of age or older, or if you are at risk for osteoporosis and fractures, ask your health care provider if you should be screened.  Ask your health care provider whether you should take a calcium or vitamin D supplement to lower your risk for osteoporosis.  Menopause may have certain physical symptoms and risks.  Hormone replacement therapy may reduce some of these symptoms and risks. Talk to your health care provider about whether hormone replacement therapy is right for you. Follow these instructions at home:  Schedule regular health, dental, and eye exams.  Stay current with your immunizations.  Do not use any tobacco products including cigarettes, chewing tobacco, or electronic cigarettes.  If you are pregnant, do not drink alcohol.  If you are breastfeeding, limit how  much and how often you drink alcohol.  Limit alcohol intake to no more than 1 drink per day for nonpregnant women. One drink equals 12 ounces of beer, 5 ounces of wine, or 1 ounces of hard liquor.  Do not use street drugs.  Do not share needles.  Ask your health care provider for help if you need support or information about quitting drugs.  Tell your health care provider if you often feel depressed.  Tell your health care provider if you have ever been abused or do not feel safe at home. This information is not intended to replace advice given to you by your health care provider. Make sure you discuss any questions you have with your health care provider. Document Released: 05/25/2011 Document Revised: 04/16/2016 Document Reviewed: 08/13/2015 Elsevier Interactive Patient Education  [employer].

## 2017-09-23 NOTE — Assessment & Plan Note (Signed)
Reviewed BMI calculator; she'll work on modest weight loss

## 2017-11-01 ENCOUNTER — Ambulatory Visit: Payer: Medicare Other | Admitting: Family Medicine

## 2017-11-03 ENCOUNTER — Ambulatory Visit: Payer: Medicare Other | Admitting: Family Medicine

## 2017-11-30 ENCOUNTER — Ambulatory Visit: Payer: Medicare Other | Admitting: Family Medicine

## 2017-12-09 ENCOUNTER — Encounter: Payer: Self-pay | Admitting: Family Medicine

## 2017-12-09 ENCOUNTER — Ambulatory Visit: Payer: Medicare Other | Admitting: Family Medicine

## 2017-12-09 VITALS — BP 125/80 | HR 92 | Temp 97.7°F | Ht 59.13 in | Wt 173.0 lb

## 2017-12-09 DIAGNOSIS — B372 Candidiasis of skin and nail: Secondary | ICD-10-CM

## 2017-12-09 DIAGNOSIS — E782 Mixed hyperlipidemia: Secondary | ICD-10-CM

## 2017-12-09 DIAGNOSIS — R030 Elevated blood-pressure reading, without diagnosis of hypertension: Secondary | ICD-10-CM

## 2017-12-09 DIAGNOSIS — J069 Acute upper respiratory infection, unspecified: Secondary | ICD-10-CM

## 2017-12-09 DIAGNOSIS — E786 Lipoprotein deficiency: Secondary | ICD-10-CM

## 2017-12-09 DIAGNOSIS — R7301 Impaired fasting glucose: Secondary | ICD-10-CM

## 2017-12-09 MED ORDER — NYSTATIN 100000 UNIT/GM EX CREA: 1.0000 "application " | TOPICAL_CREAM | Freq: Two times a day (BID) | CUTANEOUS | 0 refills | Status: DC

## 2017-12-09 NOTE — Assessment & Plan Note (Signed)
Recheck glucose and A1c; see AVS for tips on preventing progression to diabetes; encouraged healthy diet, activity, weight management

## 2017-12-09 NOTE — Assessment & Plan Note (Signed)
Controlled today; continue active life

## 2017-12-09 NOTE — Patient Instructions (Signed)
Check out the information at familydoctor.org entitled "Nutrition for Weight Loss: What You Need to Know about Fad Diets" Try to lose between 1-2 pounds per week by taking in fewer calories and burning off more calories You can succeed by limiting portions, limiting foods dense in calories and fat, becoming more active, and drinking 8 glasses of water a day (64 ounces) Don't skip meals, especially breakfast, as skipping meals may alter your metabolism Do not use over-the-counter weight loss pills or gimmicks that claim rapid weight loss A healthy BMI (or body mass index) is between 18.5 and 24.9 You can calculate your ideal BMI at the NIH website [employer]huhttp://www.nhlbi.nih.gov/health/educational/lose_wt/BMI/bmicalc.htm Try to use PLAIN allergy medicine without the decongestant Avoid: phenylephrine, phenylpropanolamine, and pseudoephredine

## 2017-12-09 NOTE — Assessment & Plan Note (Signed)
Weight loss and activity will help that

## 2017-12-09 NOTE — Progress Notes (Signed)
BP 125/80 (BP Location: Left Arm, Patient Position: Sitting, Cuff Size: Normal)   Pulse 92   Temp 97.7 F (36.5 C) (Oral)   Ht 4' 11.13" (1.502 m)   Wt 173 lb (78.5 kg)   SpO2 97%   BMI 34.79 kg/m    Subjective:    Patient ID: Cheyenne Parker, female    DOB: [birth year], 72 y.o.   MRN: [id number]  HPI: Cheyenne Parker is a 72 y.o. female  Chief Complaint  Patient presents with  . Follow-up    mammo yesterday     HPI Patient is here for follow-up and fasting labs  She has prediabetes, but her last A1c was back down in the normal range; not into sweets; not a lot of simple carbs; if she does have bread, then she gets rye bread Lab Results  Component Value Date   HGBA1C 5.6 04/28/2017   Obesity; she has lost 3 pounds over the holidays  She has had chest congestion, nose is running some, starts blowing and it's yellow and spitting up old yellow stuff; using neti pot, clears it out here  She thinks it has a yeast infection, drives her crazy  She has high cholesterol; not on statin; working out and doing water aerobics; had eggs on Sunday, first time in a while; used to eat more cheese, but doesn't buy it any more Lab Results  Component Value Date   CHOL 177 04/28/2017   CHOL 173 08/31/2016   CHOL 196 09/20/2015   Lab Results  Component Value Date   HDL 34 (L) 04/28/2017   HDL 30 (L) 08/31/2016   HDL 36 (L) 09/20/2015   Lab Results  Component Value Date   LDLCALC 131 (H) 04/28/2017   LDLCALC 126 08/31/2016   LDLCALC 145 (H) 09/20/2015   Lab Results  Component Value Date   TRIG 61 04/28/2017   TRIG 85 08/31/2016   TRIG 73 09/20/2015   Lab Results  Component Value Date   CHOLHDL 5.2 (H) 04/28/2017   CHOLHDL 5.8 (H) 08/31/2016   No results found for: LDLDIRECT  She has vitamin D deficiency, which has resolved; she had anemia, which has also resolved  Depression screen [facility] 2/9 12/09/2017 09/23/2017 04/28/2017 11/30/2016 09/21/2016  Decreased Interest 0 0 0 0 0    Down, Depressed, Hopeless 0 0 0 0 0  PHQ - 2 Score 0 0 0 0 0    Relevant past medical, surgical, family and social history reviewed Past Medical History:  Diagnosis Date  . Anemia   . Hyperlipidemia   . IFG (impaired fasting glucose)   . Obesity   . Osteoporosis   . Vitamin D deficiency disease    Past Surgical History:  Procedure Laterality Date  . ABDOMINAL HYSTERECTOMY     complete  . FRACTURE SURGERY     Family History  Problem Relation Age of Onset  . Hypertension Mother   . Stroke Mother        possibly, died at 87 years of age  . Kidney disease Father   . Diabetes Father   . Heart disease Father   . Hypertension Father   . Alcohol abuse Father   . Alcohol abuse Daughter   . Memory loss Maternal Grandmother        died at age 34   Social History   Tobacco Use  . Smoking status: Never Smoker  . Smokeless tobacco: Never Used  Substance Use Topics  . Alcohol use:  Yes    Comment: socially  . Drug use: No    Interim medical history since last visit reviewed. Allergies and medications reviewed  Review of Systems Per HPI unless specifically indicated above     Objective:    BP 125/80 (BP Location: Left Arm, Patient Position: Sitting, Cuff Size: Normal)   Pulse 92   Temp 97.7 F (36.5 C) (Oral)   Ht 4' 11.13" (1.502 m)   Wt 173 lb (78.5 kg)   SpO2 97%   BMI 34.79 kg/m   Wt Readings from Last 3 Encounters:  12/09/17 173 lb (78.5 kg)  09/23/17 176 lb 3.2 oz (79.9 kg)  04/28/17 169 lb 12.8 oz (77 kg)    Physical Exam  Constitutional: She appears well-developed and well-nourished. No distress.  HENT:  Head: Normocephalic and atraumatic.  Right Ear: Ear canal normal.  Left Ear: Ear canal normal.  Nose: Rhinorrhea present. No mucosal edema.  Mouth/Throat: No oropharyngeal exudate, posterior oropharyngeal edema or posterior oropharyngeal erythema.  Eyes: EOM are normal. No scleral icterus.  Neck: No thyromegaly present.  Cardiovascular: Normal  rate, regular rhythm and normal heart sounds.  No murmur heard. Pulmonary/Chest: Effort normal and breath sounds normal. No respiratory distress. She has no decreased breath sounds. She has no wheezes. She has no rhonchi.  Abdominal: Soft. Bowel sounds are normal. She exhibits no distension.  Musculoskeletal: Normal range of motion. She exhibits no edema.  Lymphadenopathy:    She has no cervical adenopathy.  Neurological: She is alert. She exhibits normal muscle tone.  Skin: Skin is warm and dry. She is not diaphoretic. No pallor.  Psychiatric: She has a normal mood and affect. Her behavior is normal. Judgment and thought content normal.    Results for orders placed or performed in visit on 04/28/17  Hepatitis C Antibody  Result Value Ref Range   HCV Ab NEGATIVE NEGATIVE  Cologuard  Result Value Ref Range   Cologuard Negative   CBC with Differential/Platelet  Result Value Ref Range   WBC 5.4 3.8 - 10.8 K/uL   RBC 4.17 3.80 - 5.10 MIL/uL   Hemoglobin 12.4 11.7 - 15.5 g/dL   HCT 38.4 35.0 - 45.0 %   MCV 92.1 80.0 - 100.0 fL   MCH 29.7 27.0 - 33.0 pg   MCHC 32.3 32.0 - 36.0 g/dL   RDW 13.9 11.0 - 15.0 %   Platelets 185 140 - 400 K/uL   MPV 10.3 7.5 - 12.5 fL   Neutro Abs 2,970 1,500 - 7,800 cells/uL   Lymphs Abs 1,944 850 - 3,900 cells/uL   Monocytes Absolute 378 200 - 950 cells/uL   Eosinophils Absolute 108 15 - 500 cells/uL   Basophils Absolute 0 0 - 200 cells/uL   Neutrophils Relative % 55 %   Lymphocytes Relative 36 %   Monocytes Relative 7 %   Eosinophils Relative 2 %   Basophils Relative 0 %   Smear Review Criteria for review not met   Hemoglobin A1c  Result Value Ref Range   Hgb A1c MFr Bld 5.6 <5.7 %   Mean Plasma Glucose 114 mg/dL  Lipid panel  Result Value Ref Range   Cholesterol 177 <200 mg/dL   Triglycerides 61 <150 mg/dL   HDL 34 (L) >50 mg/dL   Total CHOL/HDL Ratio 5.2 (H) <5.0 Ratio   VLDL 12 <30 mg/dL   LDL Cholesterol 131 (H) <100 mg/dL  Ferritin    Result Value Ref Range   Ferritin 27 20 -  288 ng/mL      Assessment & Plan:   Problem List Items Addressed This Visit      Cardiovascular and Mediastinum   Elevated blood pressure, situational    Controlled today; continue active life      Relevant Medications   aspirin EC 81 MG tablet   Other Relevant Orders   COMPLETE METABOLIC PANEL WITH GFR     Endocrine   IFG (impaired fasting glucose)    Recheck glucose and A1c; see AVS for tips on preventing progression to diabetes; encouraged healthy diet, activity, weight management      Relevant Orders   COMPLETE METABOLIC PANEL WITH GFR   Hemoglobin A1c     Other   Low HDL (under 40)    Weight loss and activity will help that      Relevant Orders   Lipid panel   Hyperlipidemia - Primary    Recheck lipids today; encouraged healthy diet, activity, and weight management      Relevant Medications   aspirin EC 81 MG tablet   Other Relevant Orders   COMPLETE METABOLIC PANEL WITH GFR   Lipid panel    Other Visit Diagnoses    Skin yeast infection       Relevant Medications   nystatin cream (MYCOSTATIN)   Upper respiratory infection, viral       no antibiotics indicated at this time; rest, hydration, vitamin C, neti pot   Relevant Medications   nystatin cream (MYCOSTATIN)       Follow up plan: Return in about 6 months (around 06/08/2018) for follow-up visit with Dr. Sanda Klein.  An after-visit summary was printed and given to the patient at [facility].  Please see the patient instructions which may contain other information and recommendations beyond what is mentioned above in the assessment and plan.  Meds ordered this encounter  Medications  . nystatin cream (MYCOSTATIN)    Sig: Apply 1 application topically 2 (two) times daily.    Dispense:  30 g    Refill:  0    Orders Placed This Encounter  Procedures  . COMPLETE METABOLIC PANEL WITH GFR  . Hemoglobin A1c  . Lipid panel

## 2017-12-09 NOTE — Assessment & Plan Note (Signed)
Recheck lipids today; encouraged healthy diet, activity, and weight management

## 2017-12-10 LAB — COMPLETE METABOLIC PANEL WITH GFR
AG Ratio: 1.6 (calc) (ref 1.0–2.5)
ALT: 9 U/L (ref 6–29)
AST: 13 U/L (ref 10–35)
Albumin: 4.4 g/dL (ref 3.6–5.1)
Alkaline phosphatase (APISO): 68 U/L (ref 33–130)
BUN: 12 mg/dL (ref 7–25)
CALCIUM: 9.2 mg/dL (ref 8.6–10.4)
CO2: 28 mmol/L (ref 20–32)
CREATININE: 0.69 mg/dL (ref 0.60–0.93)
Chloride: 105 mmol/L (ref 98–110)
GFR, Est African American: 102 mL/min/{1.73_m2} (ref >=60)
GFR, Est Non African American: 88 mL/min/{1.73_m2} (ref >=60)
GLOBULIN: 2.8 g/dL (ref 1.9–3.7)
GLUCOSE: 89 mg/dL (ref 65–99)
Potassium: 4.7 mmol/L (ref 3.5–5.3)
Sodium: 140 mmol/L (ref 135–146)
Total Bilirubin: 0.4 mg/dL (ref 0.2–1.2)
Total Protein: 7.2 g/dL (ref 6.1–8.1)

## 2017-12-10 LAB — LIPID PANEL
CHOL/HDL RATIO: 5.7 (calc) — AB (ref <5.0)
Cholesterol: 170 mg/dL (ref <200)
HDL: 30 mg/dL — AB (ref >50)
LDL Cholesterol (Calc): 123 mg/dL (calc) — ABNORMAL HIGH
NON-HDL CHOLESTEROL (CALC): 140 mg/dL — AB (ref <130)
TRIGLYCERIDES: 71 mg/dL (ref <150)

## 2017-12-10 LAB — HEMOGLOBIN A1C
Hgb A1c MFr Bld: 5.8 % of total Hgb — ABNORMAL HIGH (ref <5.7)
Mean Plasma Glucose: 120 (calc)
eAG (mmol/L): 6.6 (calc)

## 2017-12-11 ENCOUNTER — Other Ambulatory Visit: Payer: Self-pay | Admitting: Family Medicine

## 2017-12-11 DIAGNOSIS — E786 Lipoprotein deficiency: Secondary | ICD-10-CM

## 2017-12-11 DIAGNOSIS — E782 Mixed hyperlipidemia: Secondary | ICD-10-CM

## 2017-12-11 MED ORDER — ATORVASTATIN CALCIUM 10 MG PO TABS: 10.0000 mg | ORAL_TABLET | Freq: Every day | ORAL | 1 refills | Status: DC

## 2017-12-11 NOTE — Progress Notes (Signed)
Statin Rx sent Check lipids in 6-8 weeks

## 2017-12-13 ENCOUNTER — Telehealth: Payer: Self-pay

## 2017-12-13 NOTE — Telephone Encounter (Signed)
-----   Message from Kerman PasseyMelinda P Lada, MD sent at 12/11/2017 11:54 AM EST ----- Please let pt know that her glucose, kidney function, and liver function tests are all in the normal ranges. Her 3 month blood sugar average (the A1c) is in the prediabetes range. We'll encourage her to work on weight loss and healthy eating. We'll keep an eye on this every 6-12 months. Her cholesterol ratio shows that she has too much LDL ("bad") cholesterol and not enough HDL ("good") cholesterol. I'm going to recommend a cholesterol medicine to help lower her LDL. Weight loss and activity will help raise the HDL. Let's recheck the cholesterol in about 6-8 weeks. Please let me know if she has any thoughts or comments or questions about this. We are a team, so I value her input. Thank you

## 2017-12-13 NOTE — Telephone Encounter (Signed)
Called pt no answer. LM for pt to call back. CRM created. [facility]

## 2017-12-15 NOTE — Telephone Encounter (Signed)
Called pt no answer. LM for pt to call back.  

## 2017-12-16 NOTE — Telephone Encounter (Signed)
Copied from CRM [id number]. Topic: Quick Communication - See Telephone Encounter >> Dec 13, 2017  2:02 PM Debbe BalesGlanton, Fate Caster, [place] wrote: CRM for notification. See Telephone encounter for: labs  12/13/17. >> Dec 16, 2017 12:28 PM Percival SpanishKennedy, Cheryl W wrote:   Pt return your call [place] [facility] [place]    Called pt no answer. When pt calls back please have [facility] nurse give lab results (labs were routed to them). CRM created.

## 2017-12-20 NOTE — Telephone Encounter (Signed)
Mailed pt letter as I have been unable to speak with her after calling 3 times.

## 2017-12-23 ENCOUNTER — Encounter: Payer: Self-pay | Admitting: Family Medicine

## 2018-01-05 NOTE — Progress Notes (Signed)
Closing out scanned document note open since 11/24/16

## 2018-01-05 NOTE — Progress Notes (Signed)
Closing out note open since 12/23/17

## 2018-01-05 NOTE — Progress Notes (Signed)
Closing out scanned document note open since 11/18/16

## 2018-01-05 NOTE — Progress Notes (Signed)
Closing out note open since 07/24/16

## 2018-01-05 NOTE — Progress Notes (Signed)
closing

## 2018-06-06 ENCOUNTER — Encounter: Payer: Self-pay | Admitting: Family Medicine

## 2018-06-06 ENCOUNTER — Ambulatory Visit: Payer: Medicare Other | Admitting: Family Medicine

## 2018-06-06 VITALS — BP 120/80 | HR 84 | Temp 98.6°F | Resp 16 | Ht 59.0 in | Wt 177.2 lb

## 2018-06-06 DIAGNOSIS — R0981 Nasal congestion: Secondary | ICD-10-CM | POA: Diagnosis not present

## 2018-06-06 MED ORDER — LORATADINE 10 MG PO TABS: 10.0000 mg | ORAL_TABLET | Freq: Every day | ORAL | 0 refills | Status: DC

## 2018-06-06 MED ORDER — FLUTICASONE PROPIONATE 50 MCG/ACT NA SUSP: 2.0000 | Freq: Every day | NASAL | 0 refills | Status: DC

## 2018-06-06 NOTE — Patient Instructions (Signed)
Upper Respiratory Infection, Adult Most upper respiratory infections (URIs) are caused by a virus. A URI affects the nose, throat, and upper air passages. The most common type of URI is often called "the common cold." Follow these instructions at home:  Take medicines only as told by your doctor.  Gargle warm saltwater or take cough drops to comfort your throat as told by your doctor.  Use a warm mist humidifier or inhale steam from a shower to increase air moisture. This may make it easier to breathe.  Drink enough fluid to keep your pee (urine) clear or pale yellow.  Eat soups and other clear broths.  Have a healthy diet.  Rest as needed.  Go back to work when your fever is gone or your doctor says it is okay. ? You may need to stay home longer to avoid giving your URI to others. ? You can also wear a face mask and wash your hands often to prevent spread of the virus.  Use your inhaler more if you have asthma.  Do not use any tobacco products, including cigarettes, chewing tobacco, or electronic cigarettes. If you need help quitting, ask your doctor. Contact a doctor if:  You are getting worse, not better.  Your symptoms are not helped by medicine.  You have chills.  You are getting more short of breath.  You have brown or red mucus.  You have yellow or brown discharge from your nose.  You have pain in your face, especially when you bend forward.  You have a fever.  You have puffy (swollen) neck glands.  You have pain while swallowing.  You have white areas in the back of your throat. Get help right away if:  You have very bad or constant: ? Headache. ? Ear pain. ? Pain in your forehead, behind your eyes, and over your cheekbones (sinus pain). ? Chest pain.  You have long-lasting (chronic) lung disease and any of the following: ? Wheezing. ? Long-lasting cough. ? Coughing up blood. ? A change in your usual mucus.  You have a stiff neck.  You have  changes in your: ? Vision. ? Hearing. ? Thinking. ? Mood. This information is not intended to replace advice given to you by your health care provider. Make sure you discuss any questions you have with your health care provider. Document Released: 04/27/2008 Document Revised: 07/12/2016 Document Reviewed: 02/14/2014 Elsevier Interactive Patient Education  2018 Elsevier Inc.  

## 2018-06-06 NOTE — Progress Notes (Signed)
Name: Cheyenne Parker   MRN: [id number]    DOB: 08/26/1946   Date:06/06/2018       Progress Note  Subjective  Chief Complaint  Chief Complaint  Patient presents with  . URI    cough, congested, stuffy nose for 1 week    HPI  Pt presents with concern for 7+ days of nasal congestion.  She was working out in the yard last week when the congestion began.  Endorses occasional cough, slight headache.  Denies sore throat, fevers, chills, body aches, recent travel, rashes.  She has tried sudafed and this helped for a few days, and then it didn't work anymore.  Patient Active Problem List   Diagnosis Date Noted  . Low HDL (under 40) 04/28/2017  . Preventative health care 09/21/2016  . Constipation 09/20/2015  . Elevated blood pressure, situational 09/20/2015  . Osteoporosis   . IFG (impaired fasting glucose)   . Hyperlipidemia   . Obesity   . Hemorrhoid 07/20/2012  . Insomnia 07/20/2012    Social History   Tobacco Use  . Smoking status: Never Smoker  . Smokeless tobacco: Never Used  Substance Use Topics  . Alcohol use: Yes    Comment: socially     Current Outpatient Medications:  .  aspirin EC 81 MG tablet, Take 1 tablet (81 mg total) by mouth daily., Disp: , Rfl:  .  atorvastatin (LIPITOR) 10 MG tablet, Take 1 tablet (10 mg total) by mouth at bedtime., Disp: 30 tablet, Rfl: 1 .  Biotin 5000 MCG CAPS, Take 1 capsule by mouth daily., Disp: , Rfl:  .  Calcium-Magnesium-Vitamin D (CALCIUM 1200+D3 PO), Take 1 tablet by mouth daily., Disp: , Rfl:  .  fexofenadine (ALLEGRA) 180 MG tablet, Take 180 mg by mouth as needed for allergies or rhinitis., Disp: , Rfl:  .  Multiple Vitamin (MULTIVITAMIN) tablet, Take 1 tablet by mouth daily., Disp: , Rfl:  .  polyethylene glycol powder (MIRALAX) powder, Take 1 Container by mouth as needed., Disp: , Rfl:  .  Vitamin D, Cholecalciferol, 1000 units CAPS, Take 1 capsule by mouth daily., Disp: , Rfl:  .  nystatin cream (MYCOSTATIN), Apply 1  application topically 2 (two) times daily. (Patient not taking: Reported on 06/06/2018), Disp: 30 g, Rfl: 0  No Known Allergies  ROS  Ten systems reviewed and is negative except as mentioned in HPI  Objective  Vitals:   06/06/18 0933  BP: 120/80  Pulse: 84  Resp: 16  Temp: 98.6 F (37 C)  TempSrc: Oral  SpO2: 96%  Weight: 177 lb 3.2 oz (80.4 kg)  Height: 4\' 11"  (1.499 m)   Body mass index is 35.79 kg/m.  Nursing Note and Vital Signs reviewed.  Physical Exam  Constitutional: Patient appears well-developed and well-nourished. Obese. No distress.  HEENT: head atraumatic, normocephalic, pupils equal and reactive to light, EOM's intact, TM's without erythema or bulging, no maxillary or frontal sinus tenderness, neck supple without lymphadenopathy, oropharynx pink and moist without exudate, nasal turbinates are inflamed. Cardiovascular: Normal rate, regular rhythm, S1/S2 present.  No murmur or rub heard. No BLE edema. Pulmonary/Chest: Effort normal and breath sounds clear. No respiratory distress or retractions. Psychiatric: Patient has a normal mood and affect. behavior is normal. Judgment and thought content normal.  No results found for this or any previous visit (from the past 72 hour(s)).  Assessment & Plan  1. Nasal congestion - Advised likely allergic or viral in nature; I do not see evidence of bacterial  infection at this time. Monitor for 1 additional week, and if not improving she will return for further evaluation.  - loratadine (CLARITIN) 10 MG tablet; Take 1 tablet (10 mg total) by mouth daily.  Dispense: 30 tablet; Refill: 0 - fluticasone (FLONASE) 50 MCG/ACT nasal spray; Place 2 sprays into both nostrils daily.  Dispense: 16 g; Refill: 0  -Red flags and when to present for emergency care or RTC including fever >101.46F, chest pain, shortness of breath, new/worsening/un-resolving symptoms, reviewed with patient at time of visit. Follow up and care instructions  discussed and provided in AVS.

## 2019-01-31 ENCOUNTER — Telehealth: Payer: Self-pay | Admitting: Family Medicine

## 2019-01-31 NOTE — Telephone Encounter (Signed)
Cheyenne Parker, this pt is no longer a pt at [facility], she stated she is seeing Corky Downs, MD. Could you please update her chart?  Thanks! [name]

## 2019-07-20 ENCOUNTER — Other Ambulatory Visit
Admission: RE | Admit: 2019-07-20 | Discharge: 2019-07-20 | Disposition: A | Discharge status: Home / Self Care | Payer: Medicare Other | Source: Ambulatory Visit | Attending: Internal Medicine | Admitting: Internal Medicine

## 2019-07-20 DIAGNOSIS — E785 Hyperlipidemia, unspecified: Secondary | ICD-10-CM | POA: Insufficient documentation

## 2019-07-20 LAB — LIPID PANEL
Cholesterol: 183 mg/dL (ref 0–200)
HDL: 33 mg/dL — ABNORMAL LOW (ref >40)
LDL Cholesterol: 135 mg/dL — ABNORMAL HIGH (ref 0–99)
Total CHOL/HDL Ratio: 5.5 RATIO
Triglycerides: 74 mg/dL (ref <150)
VLDL: 15 mg/dL (ref 0–40)

## 2020-02-02 ENCOUNTER — Other Ambulatory Visit: Payer: Self-pay | Admitting: Neurology

## 2020-02-02 ENCOUNTER — Other Ambulatory Visit (HOSPITAL_COMMUNITY): Payer: Self-pay | Admitting: Neurology

## 2020-02-02 DIAGNOSIS — R4189 Other symptoms and signs involving cognitive functions and awareness: Secondary | ICD-10-CM

## 2020-02-17 ENCOUNTER — Ambulatory Visit (HOSPITAL_COMMUNITY)
Admission: RE | Admit: 2020-02-17 | Discharge: 2020-02-17 | Disposition: A | Discharge status: Home / Self Care | Payer: Medicare Other | Source: Ambulatory Visit | Attending: Neurology | Admitting: Neurology

## 2020-02-17 DIAGNOSIS — R4189 Other symptoms and signs involving cognitive functions and awareness: Secondary | ICD-10-CM | POA: Diagnosis not present

## 2020-04-01 ENCOUNTER — Ambulatory Visit: Payer: Medicare Other | Attending: Neurology | Admitting: Speech Pathology

## 2020-04-09 ENCOUNTER — Ambulatory Visit: Payer: Medicare Other | Admitting: Speech Pathology

## 2020-04-11 ENCOUNTER — Ambulatory Visit: Payer: Medicare Other | Admitting: Speech Pathology

## 2020-04-12 ENCOUNTER — Encounter: Payer: Self-pay | Admitting: Podiatry

## 2020-04-12 ENCOUNTER — Other Ambulatory Visit: Payer: Self-pay

## 2020-04-12 ENCOUNTER — Ambulatory Visit (INDEPENDENT_AMBULATORY_CARE_PROVIDER_SITE_OTHER): Payer: Medicare Other | Admitting: Podiatry

## 2020-04-12 DIAGNOSIS — L84 Corns and callosities: Secondary | ICD-10-CM

## 2020-04-12 DIAGNOSIS — L989 Disorder of the skin and subcutaneous tissue, unspecified: Secondary | ICD-10-CM | POA: Diagnosis not present

## 2020-04-15 ENCOUNTER — Other Ambulatory Visit: Payer: Self-pay

## 2020-04-15 ENCOUNTER — Ambulatory Visit: Payer: Medicare Other | Admitting: Internal Medicine

## 2020-04-15 ENCOUNTER — Encounter: Payer: Self-pay | Admitting: Internal Medicine

## 2020-04-15 VITALS — BP 179/75 | HR 68 | Wt 179.3 lb

## 2020-04-15 DIAGNOSIS — E782 Mixed hyperlipidemia: Secondary | ICD-10-CM | POA: Diagnosis not present

## 2020-04-15 DIAGNOSIS — I10 Essential (primary) hypertension: Secondary | ICD-10-CM

## 2020-04-15 MED ORDER — AMLODIPINE BESYLATE 5 MG PO TABS: 5.0000 mg | ORAL_TABLET | Freq: Every day | ORAL | 3 refills | Status: DC

## 2020-04-15 NOTE — Assessment & Plan Note (Signed)
Patient was advised to follow low-cholesterol diet and also to take her statin for dyslipidemia.

## 2020-04-15 NOTE — Progress Notes (Signed)
Established Patient Office Visit  Subjective:  Patient ID: Cheyenne Parker, female    DOB: [birth year]  Age: 74 y.o. MRN: [id number]  CC:  Chief Complaint  Patient presents with  . Hypertension    elevated BP     Hypertension This is a recurrent problem. The problem is controlled. Pertinent negatives include no anxiety, chest pain, headaches, neck pain, PND or shortness of breath. Compliance problems include diet.  There is no history of kidney disease, heart failure or PVD.    Cheyenne Parker presents for annual medical checkup.  Past Medical History:  Diagnosis Date  . Anemia   . Hyperlipidemia   . IFG (impaired fasting glucose)   . Obesity   . Osteoporosis   . Vitamin D deficiency disease     Past Surgical History:  Procedure Laterality Date  . ABDOMINAL HYSTERECTOMY     complete  . FRACTURE SURGERY      Family History  Problem Relation Age of Onset  . Hypertension Mother   . Stroke Mother        possibly, died at 8 years of age  . Kidney disease Father   . Diabetes Father   . Heart disease Father   . Hypertension Father   . Alcohol abuse Father   . Alcohol abuse Daughter   . Memory loss Maternal Grandmother        died at age 58    Social History   Socioeconomic History  . Marital status: Married    Spouse name: Not on file  . Number of children: Not on file  . Years of education: Not on file  . Highest education level: Not on file  Occupational History  . Not on file  Tobacco Use  . Smoking status: Never Smoker  . Smokeless tobacco: Never Used  Substance and Sexual Activity  . Alcohol use: Yes    Comment: socially  . Drug use: No  . Sexual activity: Not Currently  Other Topics Concern  . Not on file  Social History Narrative  . Not on file   Social Determinants of Health   Financial Resource Strain:   . Difficulty of Paying Living Expenses:   Food Insecurity:   . Worried About Charity fundraiser in the Last Year:   . Arboriculturist in  the Last Year:   Transportation Needs:   . Film/video editor (Medical):   Marland Kitchen Lack of Transportation (Non-Medical):   Physical Activity:   . Days of Exercise per Week:   . Minutes of Exercise per Session:   Stress:   . Feeling of Stress :   Social Connections:   . Frequency of Communication with Friends and Family:   . Frequency of Social Gatherings with Friends and Family:   . Attends Religious Services:   . Active Member of Clubs or Organizations:   . Attends Archivist Meetings:   Marland Kitchen Marital Status:   Intimate Partner Violence:   . Fear of Current or Ex-Partner:   . Emotionally Abused:   Marland Kitchen Physically Abused:   . Sexually Abused:      Current Outpatient Medications:  .  amLODipine (NORVASC) 5 MG tablet, Take 1 tablet (5 mg total) by mouth daily., Disp: 30 tablet, Rfl: 3 .  aspirin EC 81 MG tablet, Take 1 tablet (81 mg total) by mouth daily., Disp: , Rfl:  .  atorvastatin (LIPITOR) 10 MG tablet, Take 1 tablet (10 mg total) by mouth  at bedtime., Disp: 30 tablet, Rfl: 1 .  fexofenadine (ALLEGRA) 180 MG tablet, Take 180 mg by mouth as needed for allergies or rhinitis., Disp: , Rfl:  .  loratadine (CLARITIN) 10 MG tablet, Take 1 tablet (10 mg total) by mouth daily., Disp: 30 tablet, Rfl: 0 .  Multiple Vitamin (MULTIVITAMIN) tablet, Take 1 tablet by mouth daily., Disp: , Rfl:  .  Vitamin D, Cholecalciferol, 1000 units CAPS, Take 1 capsule by mouth daily., Disp: , Rfl:    No Known Allergies  ROS Review of Systems  Constitutional: Negative for chills.  HENT: Negative for hearing loss.   Respiratory: Negative for shortness of breath.   Cardiovascular: Negative for chest pain and PND.  Gastrointestinal: Negative for anal bleeding.  Genitourinary: Negative for flank pain.  Musculoskeletal: Negative for arthralgias and neck pain.  Neurological: Negative for headaches.  Psychiatric/Behavioral: Negative for confusion, dysphoric mood, hallucinations, sleep disturbance and  suicidal ideas. The patient is not nervous/anxious.       Objective:    Physical Exam  Constitutional: She appears well-developed and well-nourished.  HENT:  Head: Normocephalic and atraumatic.  Eyes: Pupils are equal, round, and reactive to light.  Neck: No JVD present. No tracheal deviation present. No thyromegaly present.  Cardiovascular: Normal rate and regular rhythm.  No murmur heard. Pulmonary/Chest: No respiratory distress.  Musculoskeletal:        General: No edema.  Lymphadenopathy:    She has cervical adenopathy.  Neurological: She is alert.  Psychiatric: She has a normal mood and affect. Her behavior is normal. Thought content normal.    BP (!) 179/75   Pulse 68   Wt 179 lb 4.8 oz (81.3 kg)   BMI 36.21 kg/m  Wt Readings from Last 3 Encounters:  04/15/20 179 lb 4.8 oz (81.3 kg)  06/06/18 177 lb 3.2 oz (80.4 kg)  12/09/17 173 lb (78.5 kg)     Health Maintenance Due  Topic Date Due  . TETANUS/TDAP  Never done  . MAMMOGRAM  12/08/2018    There are no preventive care reminders to display for this patient.  Lab Results  Component Value Date   TSH 1.610 09/20/2015   Lab Results  Component Value Date   WBC 5.4 04/28/2017   HGB 12.4 04/28/2017   HCT 38.4 04/28/2017   MCV 92.1 04/28/2017   PLT 185 04/28/2017   Lab Results  Component Value Date   NA 140 12/09/2017   K 4.7 12/09/2017   CO2 28 12/09/2017   GLUCOSE 89 12/09/2017   BUN 12 12/09/2017   CREATININE 0.69 12/09/2017   BILITOT 0.4 12/09/2017   ALKPHOS 55 08/31/2016   AST 13 12/09/2017   ALT 9 12/09/2017   PROT 7.2 12/09/2017   ALBUMIN 4.0 08/31/2016   CALCIUM 9.2 12/09/2017   Lab Results  Component Value Date   CHOL 183 07/20/2019   Lab Results  Component Value Date   HDL 33 (L) 07/20/2019   Lab Results  Component Value Date   LDLCALC 135 (H) 07/20/2019   Lab Results  Component Value Date   TRIG 74 07/20/2019   Lab Results  Component Value Date   CHOLHDL 5.5 07/20/2019    Lab Results  Component Value Date   HGBA1C 5.8 (H) 12/09/2017      Assessment & Plan:   Problem List Items Addressed This Visit      Cardiovascular and Mediastinum   Essential hypertension - Primary    Labile.  Her blood pressure is elevated  today, will start her on amlodipine 5 mg p.o. daily a prescription was given for 30 pills with 2 refills.      Relevant Medications   amLODipine (NORVASC) 5 MG tablet     Other   Hyperlipidemia    Patient was advised to follow low-cholesterol diet and also to take her statin for dyslipidemia.      Relevant Medications   amLODipine (NORVASC) 5 MG tablet      Meds ordered this encounter  Medications  . amLODipine (NORVASC) 5 MG tablet    Sig: Take 1 tablet (5 mg total) by mouth daily.    Dispense:  30 tablet    Refill:  3  1. Essential hypertension Blood pressure labile I added Norvasc 5 mg p.o. daily. - amLODipine (NORVASC) 5 MG tablet; Take 1 tablet (5 mg total) by mouth daily.  Dispense: 30 tablet; Refill: 3 blood  2. Mixed hyperlipidemia Patient was advised to take her cholesterol medication. Follow-up: Return in about 3 months (around 07/16/2020).    Corky Downs, MD

## 2020-04-15 NOTE — Assessment & Plan Note (Signed)
Labile.  Her blood pressure is elevated today, will start her on amlodipine 5 mg p.o. daily a prescription was given for 30 pills with 2 refills.

## 2020-04-15 NOTE — Progress Notes (Signed)
   Subjective: 74 y.o. female presenting to the office today as a new patient with a chief complaint of a painful callus lesion between the first and second toes of the right foot that appeared 1-2 months ago. Wearing certain shoes increases the pain. She has been wrapping the area and applying OTC callus treatments with minimal relief. Patient is here for further evaluation and treatment.   Past Medical History:  Diagnosis Date  . Anemia   . Hyperlipidemia   . IFG (impaired fasting glucose)   . Obesity   . Osteoporosis   . Vitamin D deficiency disease      Objective:  Physical Exam General: Alert and oriented x3 in no acute distress  Dermatology: Hyperkeratotic lesion(s) present on the medial right 2nd toe. Pain on palpation with a central nucleated core noted. Skin is warm, dry and supple bilateral lower extremities. Negative for open lesions or macerations.  Vascular: Palpable pedal pulses bilaterally. No edema or erythema noted. Capillary refill within normal limits.  Neurological: Epicritic and protective threshold grossly intact bilaterally.   Musculoskeletal Exam: Pain on palpation at the keratotic lesion(s) noted. Range of motion within normal limits bilateral. Muscle strength 5/5 in all groups bilateral.  Assessment: 1. Pre-ulcerative callus lesion medial right 2nd toe    Plan of Care:  1. Patient evaluated 2. Excisional debridement of keratoic lesion(s) using a chisel blade was performed without incident. Salinocaine applied. 3. Dressed area with light dressing. 4. Recommended OTC corn and callus remover.  5. Recommended wide-fitting shoes.  6. Patient is to return to the clinic PRN.   Felecia Shelling, DPM [facility]  Dr. Felecia Shelling, DPM    [address]                                        [place], [place] [address]                Office [phone]  Fax [phone]

## 2020-04-16 ENCOUNTER — Ambulatory Visit: Payer: Medicare Other | Admitting: Speech Pathology

## 2020-04-18 ENCOUNTER — Encounter: Payer: Medicare Other | Admitting: Speech Pathology

## 2020-04-23 ENCOUNTER — Encounter: Payer: Medicare Other | Admitting: Speech Pathology

## 2020-05-01 ENCOUNTER — Encounter: Payer: Medicare Other | Admitting: Speech Pathology

## 2020-05-07 ENCOUNTER — Encounter: Payer: Medicare Other | Admitting: Speech Pathology

## 2020-05-09 ENCOUNTER — Encounter: Payer: Medicare Other | Admitting: Speech Pathology

## 2020-07-16 ENCOUNTER — Other Ambulatory Visit: Payer: Self-pay

## 2020-07-16 ENCOUNTER — Encounter: Payer: Self-pay | Admitting: Internal Medicine

## 2020-07-16 ENCOUNTER — Ambulatory Visit (INDEPENDENT_AMBULATORY_CARE_PROVIDER_SITE_OTHER): Payer: Medicare Other | Admitting: Internal Medicine

## 2020-07-16 VITALS — BP 150/67 | HR 62 | Ht 61.0 in | Wt 179.8 lb

## 2020-07-16 DIAGNOSIS — Z6835 Body mass index (BMI) 35.0-35.9, adult: Secondary | ICD-10-CM

## 2020-07-16 DIAGNOSIS — E782 Mixed hyperlipidemia: Secondary | ICD-10-CM | POA: Diagnosis not present

## 2020-07-16 DIAGNOSIS — I10 Essential (primary) hypertension: Secondary | ICD-10-CM | POA: Diagnosis not present

## 2020-07-16 DIAGNOSIS — E6609 Other obesity due to excess calories: Secondary | ICD-10-CM | POA: Diagnosis not present

## 2020-07-16 DIAGNOSIS — R413 Other amnesia: Secondary | ICD-10-CM | POA: Diagnosis not present

## 2020-07-16 NOTE — Assessment & Plan Note (Signed)
Pt is on Aricept and Namenda

## 2020-07-16 NOTE — Assessment & Plan Note (Signed)
-   I encouraged the patient to lose weight.  - I educated them on making healthy dietary choices including eating more fruits and vegetables and less fried foods. - I encouraged the patient to exercise more, and educated on the benefits of exercise including weight loss, diabetes prevention, and hypertension management.   

## 2020-07-16 NOTE — Assessment & Plan Note (Signed)
-   Today, the patient's blood pressure is well managed on amlodipine. - The patient will continue the current treatment regimen.  - I encouraged the patient to eat a low-sodium diet to help control blood pressure. - I encouraged the patient to live an active lifestyle and complete activities that increases heart rate to 85% target heart rate at least 5 times per week for one hour.     

## 2020-07-16 NOTE — Assessment & Plan Note (Signed)
-   The patient's hyperlipidemia is stable on lipitor. - The patient will continue the current treatment regimen.  - I encouraged the patient to eat more vegetables and whole wheat, and to avoid fatty foods like whole milk, hard cheese, egg yolks, margarine, baked sweets, and fried foods.  - I encouraged the patient to live an active lifestyle and complete activities for 40 minutes at least three times per week.  - I instructed the patient to go to the ER if they begin having chest pain.   

## 2020-07-16 NOTE — Assessment & Plan Note (Signed)
-   Today, the patient's blood pressure is well managed on amlodipine. - The patient will continue the current treatment regimen.  - I encouraged the patient to eat a low-sodium diet to help control blood pressure. - I encouraged the patient to live an active lifestyle and complete activities that increases heart rate to 85% target heart rate at least 5 times per week for one hour.

## 2020-07-16 NOTE — Progress Notes (Signed)
Established Patient Office Visit  SUBJECTIVE:  Subjective  Patient ID: Cheyenne Parker, female    DOB: 1946/04/05  Age: 74 y.o. MRN: [id number]  CC:  Chief Complaint  Patient presents with  . Hypertension    HPI Cheyenne Parker is a 74 y.o. female presenting today for a routine hypertension check.   Her blood pressure today is 150/67. She is taking her medication without any difficulty. She declines any missed doses. This morning she ate an egg sandwich for breakfast.   She notes that earlier this month she ate a lot of home grown tomatoes and they ended up giving her diarrhea.    Past Medical History:  Diagnosis Date  . Anemia   . Hyperlipidemia   . IFG (impaired fasting glucose)   . Obesity   . Osteoporosis   . Vitamin D deficiency disease     Past Surgical History:  Procedure Laterality Date  . ABDOMINAL HYSTERECTOMY     complete  . FRACTURE SURGERY      Family History  Problem Relation Age of Onset  . Hypertension Mother   . Stroke Mother        possibly, died at 38 years of age  . Kidney disease Father   . Diabetes Father   . Heart disease Father   . Hypertension Father   . Alcohol abuse Father   . Alcohol abuse Daughter   . Memory loss Maternal Grandmother        died at age 77    Social History   Socioeconomic History  . Marital status: Married    Spouse name: Not on file  . Number of children: Not on file  . Years of education: Not on file  . Highest education level: Not on file  Occupational History  . Not on file  Tobacco Use  . Smoking status: Never Smoker  . Smokeless tobacco: Never Used  Vaping Use  . Vaping Use: Never used  Substance and Sexual Activity  . Alcohol use: Yes    Comment: socially  . Drug use: No  . Sexual activity: Not Currently  Other Topics Concern  . Not on file  Social History Narrative  . Not on file   Social Determinants of Health   Financial Resource Strain:   . Difficulty of Paying Living Expenses: Not on  file  Food Insecurity:   . Worried About Programme researcher, broadcasting/film/video in the Last Year: Not on file  . Ran Out of Food in the Last Year: Not on file  Transportation Needs:   . Lack of Transportation (Medical): Not on file  . Lack of Transportation (Non-Medical): Not on file  Physical Activity:   . Days of Exercise per Week: Not on file  . Minutes of Exercise per Session: Not on file  Stress:   . Feeling of Stress : Not on file  Social Connections:   . Frequency of Communication with Friends and Family: Not on file  . Frequency of Social Gatherings with Friends and Family: Not on file  . Attends Religious Services: Not on file  . Active Member of Clubs or Organizations: Not on file  . Attends Banker Meetings: Not on file  . Marital Status: Not on file  Intimate Partner Violence:   . Fear of Current or Ex-Partner: Not on file  . Emotionally Abused: Not on file  . Physically Abused: Not on file  . Sexually Abused: Not on file  Current Outpatient Medications:  .  amLODipine (NORVASC) 5 MG tablet, Take 1 tablet (5 mg total) by mouth daily., Disp: 30 tablet, Rfl: 3 .  aspirin EC 81 MG tablet, Take 1 tablet (81 mg total) by mouth daily., Disp: , Rfl:  .  atorvastatin (LIPITOR) 10 MG tablet, Take 1 tablet (10 mg total) by mouth at bedtime., Disp: 30 tablet, Rfl: 1 .  fexofenadine (ALLEGRA) 180 MG tablet, Take 180 mg by mouth as needed for allergies or rhinitis., Disp: , Rfl:  .  loratadine (CLARITIN) 10 MG tablet, Take 1 tablet (10 mg total) by mouth daily., Disp: 30 tablet, Rfl: 0 .  Multiple Vitamin (MULTIVITAMIN) tablet, Take 1 tablet by mouth daily., Disp: , Rfl:  .  Vitamin D, Cholecalciferol, 1000 units CAPS, Take 1 capsule by mouth daily., Disp: , Rfl:    No Known Allergies  ROS Review of Systems  Constitutional: Negative.   HENT: Negative.   Eyes: Negative.   Respiratory: Negative.   Cardiovascular: Negative.   Gastrointestinal: Negative.   Endocrine: Negative.    Genitourinary: Negative.   Musculoskeletal: Negative.   Skin: Negative.   Allergic/Immunologic: Negative.   Neurological: Negative.   Hematological: Negative.   Psychiatric/Behavioral: Negative.   All other systems reviewed and are negative.    OBJECTIVE:    Physical Exam Vitals reviewed.  Constitutional:      Appearance: Normal appearance.  HENT:     Mouth/Throat:     Mouth: Mucous membranes are moist.  Eyes:     Pupils: Pupils are equal, round, and reactive to light.  Neck:     Vascular: No carotid bruit.  Cardiovascular:     Rate and Rhythm: Normal rate and regular rhythm.     Pulses: Normal pulses.     Heart sounds: Normal heart sounds.  Pulmonary:     Effort: Pulmonary effort is normal.     Breath sounds: Normal breath sounds.  Abdominal:     General: Bowel sounds are normal.     Palpations: Abdomen is soft. There is no hepatomegaly, splenomegaly or mass.     Tenderness: There is no abdominal tenderness.     Hernia: No hernia is present.  Musculoskeletal:        General: No tenderness.     Cervical back: Neck supple.     Right lower leg: No edema.     Left lower leg: No edema.  Skin:    Findings: No rash.     Comments: Varicose veins in legs.   Neurological:     Mental Status: She is alert and oriented to person, place, and time.     Motor: No weakness.  Psychiatric:        Mood and Affect: Mood and affect normal.        Behavior: Behavior normal.     BP (!) 150/67   Pulse 62   Ht 5\' 1"  (1.549 m)   Wt 179 lb 12.8 oz (81.6 kg)   BMI 33.97 kg/m  Wt Readings from Last 3 Encounters:  07/16/20 179 lb 12.8 oz (81.6 kg)  04/15/20 179 lb 4.8 oz (81.3 kg)  06/06/18 177 lb 3.2 oz (80.4 kg)    Health Maintenance Due  Topic Date Due  . TETANUS/TDAP  Never done  . MAMMOGRAM  12/08/2018  . Fecal DNA (Cologuard)  05/12/2020  . INFLUENZA VACCINE  06/23/2020    There are no preventive care reminders to display for this patient.  CBC Latest Ref Rng &  Units 04/28/2017 08/31/2016  WBC 3.8 - 10.8 K/uL 5.4 6.6  Hemoglobin 11.7 - 15.5 g/dL 80.9 11.4(L)  Hematocrit 35 - 45 % 38.4 35.2  Platelets 140 - 400 K/uL 185 199   CMP Latest Ref Rng & Units 12/09/2017 08/31/2016 09/20/2015  Glucose 65 - 99 mg/dL 89 89 85  BUN 7 - 25 mg/dL 12 14 9   Creatinine 0.60 - 0.93 mg/dL 9.83 3.82  Sodium 135 - 146 mmol/L 140 142 143  Potassium 3.5 - 5.3 mmol/L 4.7 4.5 4.7  Chloride 98 - 110 mmol/L 105 106 102  CO2 20 - 32 mmol/L 28 25 25   Calcium 8.6 - 10.4 mg/dL 9.2 9.3 9.5  Total Protein 6.1 - 8.1 g/dL 7.2 6.5 6.9  Total Bilirubin 0.2 - 1.2 mg/dL 0.4 0.5 0.3  Alkaline Phos 33 - 130 U/L - 55 74  AST 10 - 35 U/L 13 15 17   ALT 6 - 29 U/L 9 10 11     Lab Results  Component Value Date   TSH 1.610 09/20/2015   Lab Results  Component Value Date   ALBUMIN 4.0 08/31/2016   Lab Results  Component Value Date   CHOL 183 07/20/2019   CHOL 170 12/09/2017   CHOL 177 04/28/2017   HDL 33 (L) 07/20/2019   HDL 30 (L) 12/09/2017   HDL 34 (L) 04/28/2017   LDLCALC 135 (H) 07/20/2019   LDLCALC 123 (H) 12/09/2017   LDLCALC 131 (H) 04/28/2017   CHOLHDL 5.5 07/20/2019   CHOLHDL 5.7 (H) 12/09/2017   CHOLHDL 5.2 (H) 04/28/2017   Lab Results  Component Value Date   TRIG 74 07/20/2019   Lab Results  Component Value Date   HGBA1C 5.8 (H) 12/09/2017   HGBA1C 5.6 04/28/2017   HGBA1C 5.5 08/31/2016      ASSESSMENT & PLAN:   Problem List Items Addressed This Visit      Cardiovascular and Mediastinum   Essential hypertension - Primary    - Today, the patient's blood pressure is well managed on amlodipine. - The patient will continue the current treatment regimen.  - I encouraged the patient to eat a low-sodium diet to help control blood pressure. - I encouraged the patient to live an active lifestyle and complete activities that increases heart rate to 85% target heart rate at least 5 times per week for one hour.            Other   Mixed  hyperlipidemia    - The patient's hyperlipidemia is stable on lipitor. - The patient will continue the current treatment regimen.  - I encouraged the patient to eat more vegetables and whole wheat, and to avoid fatty foods like whole milk, hard cheese, egg yolks, margarine, baked sweets, and fried foods.  - I encouraged the patient to live an active lifestyle and complete activities for 40 minutes at least three times per week.  - I instructed the patient to go to the ER if they begin having chest pain.        Obesity    - I encouraged the patient to lose weight.  - I educated them on making healthy dietary choices including eating more fruits and vegetables and less fried foods. - I encouraged the patient to exercise more, and educated on the benefits of exercise including weight loss, diabetes prevention, and hypertension management.        Memory change    Pt is on Aricept and Namenda  No orders of the defined types were placed in this encounter.   Follow-up: No follow-ups on file.    Dr. Woodroe ChenJaved Neli Fofana [facility] [address], [place], [place] [address]   By signing my name below, I, [employer], attest that this documentation has been prepared under the direction and in the presence of Corky DownsMasoud, Javarion Douty, MD. Electronically Signed: Corky DownsJaved Calissa Swenor, MD 07/16/20, 12:43 PM   I personally performed the services described in this documentation, which was SCRIBED in my presence. The recorded information has been reviewed and considered accurate. It has been edited as necessary during review. Corky DownsJaved Leelynn Whetsel, MD

## 2020-08-17 ENCOUNTER — Other Ambulatory Visit: Payer: Self-pay | Admitting: Internal Medicine

## 2020-08-17 DIAGNOSIS — I10 Essential (primary) hypertension: Secondary | ICD-10-CM

## 2020-08-22 ENCOUNTER — Ambulatory Visit (INDEPENDENT_AMBULATORY_CARE_PROVIDER_SITE_OTHER): Payer: Medicare Other

## 2020-08-22 ENCOUNTER — Ambulatory Visit: Payer: Medicare Other

## 2020-08-22 ENCOUNTER — Other Ambulatory Visit: Payer: Self-pay

## 2020-08-22 DIAGNOSIS — Z23 Encounter for immunization: Secondary | ICD-10-CM | POA: Diagnosis not present

## 2020-09-10 ENCOUNTER — Ambulatory Visit (INDEPENDENT_AMBULATORY_CARE_PROVIDER_SITE_OTHER): Payer: Medicare Other | Admitting: Internal Medicine

## 2020-09-10 ENCOUNTER — Encounter: Payer: Self-pay | Admitting: Internal Medicine

## 2020-09-10 VITALS — BP 140/80 | HR 64 | Ht 59.0 in | Wt 175.5 lb

## 2020-09-10 DIAGNOSIS — I10 Essential (primary) hypertension: Secondary | ICD-10-CM | POA: Diagnosis not present

## 2020-09-10 DIAGNOSIS — Z0000 Encounter for general adult medical examination without abnormal findings: Secondary | ICD-10-CM | POA: Diagnosis not present

## 2020-09-10 DIAGNOSIS — Z23 Encounter for immunization: Secondary | ICD-10-CM | POA: Diagnosis not present

## 2020-09-10 DIAGNOSIS — R413 Other amnesia: Secondary | ICD-10-CM

## 2020-09-10 NOTE — Progress Notes (Signed)
Established Patient Office Visit  Subjective:  Patient ID: Cheyenne Parker, female    DOB: 09/06/1946  Age: 74 y.o. MRN: [id number]  CC:  Chief Complaint  Patient presents with  . Annual Exam     Cheyenne Parker presents for general checkup. Patient exercises twice a week.  She occasionally have a nosebleed.  She denies any chest pain nausea vomiting or shortness of breath there is no trouble breathing she denies any history of abdominal pain or stomach pain.  She knows the name of the president and she could not calculate the distance from the [place] to [place] and nearly accurately.  She does not have any major complain  no black stools no hemorrhoidal bleeding.  Patient complaining of dry skin.  Past Medical History:  Diagnosis Date  . Anemia   . Hyperlipidemia   . IFG (impaired fasting glucose)   . Obesity   . Osteoporosis   . Vitamin D deficiency disease     Past Surgical History:  Procedure Laterality Date  . ABDOMINAL HYSTERECTOMY     complete  . FRACTURE SURGERY      Family History  Problem Relation Age of Onset  . Hypertension Mother   . Stroke Mother        possibly, died at 69 years of age  . Kidney disease Father   . Diabetes Father   . Heart disease Father   . Hypertension Father   . Alcohol abuse Father   . Alcohol abuse Daughter   . Memory loss Maternal Grandmother        died at age 45    Social History   Socioeconomic History  . Marital status: Married    Spouse name: Not on file  . Number of children: Not on file  . Years of education: Not on file  . Highest education level: Not on file  Occupational History  . Not on file  Tobacco Use  . Smoking status: Never Smoker  . Smokeless tobacco: Never Used  Vaping Use  . Vaping Use: Never used  Substance and Sexual Activity  . Alcohol use: Yes    Comment: socially  . Drug use: No  . Sexual activity: Not Currently  Other Topics Concern  . Not on file  Social History Narrative  . Not  on file   Social Determinants of Health   Financial Resource Strain:   . Difficulty of Paying Living Expenses: Not on file  Food Insecurity:   . Worried About Programme researcher, broadcasting/film/video in the Last Year: Not on file  . Ran Out of Food in the Last Year: Not on file  Transportation Needs:   . Lack of Transportation (Medical): Not on file  . Lack of Transportation (Non-Medical): Not on file  Physical Activity:   . Days of Exercise per Week: Not on file  . Minutes of Exercise per Session: Not on file  Stress:   . Feeling of Stress : Not on file  Social Connections:   . Frequency of Communication with Friends and Family: Not on file  . Frequency of Social Gatherings with Friends and Family: Not on file  . Attends Religious Services: Not on file  . Active Member of Clubs or Organizations: Not on file  . Attends Banker Meetings: Not on file  . Marital Status: Not on file  Intimate Partner Violence:   . Fear of Current or Ex-Partner: Not on file  . Emotionally Abused: Not on file  .  Physically Abused: Not on file  . Sexually Abused: Not on file     Current Outpatient Medications:  .  amLODipine (NORVASC) 5 MG tablet, Take 1 tablet by mouth once daily, Disp: 90 tablet, Rfl: 0 .  aspirin EC 81 MG tablet, Take 1 tablet (81 mg total) by mouth daily., Disp: , Rfl:  .  atorvastatin (LIPITOR) 10 MG tablet, Take 1 tablet (10 mg total) by mouth at bedtime., Disp: 30 tablet, Rfl: 1 .  fexofenadine (ALLEGRA) 180 MG tablet, Take 180 mg by mouth as needed for allergies or rhinitis., Disp: , Rfl:  .  loratadine (CLARITIN) 10 MG tablet, Take 1 tablet (10 mg total) by mouth daily., Disp: 30 tablet, Rfl: 0 .  Multiple Vitamin (MULTIVITAMIN) tablet, Take 1 tablet by mouth daily., Disp: , Rfl:  .  Vitamin D, Cholecalciferol, 1000 units CAPS, Take 1 capsule by mouth daily., Disp: , Rfl:    No Known Allergies  ROS Review of Systems  Constitutional: Negative.   HENT: Negative.   Eyes:  Negative.   Respiratory: Negative.   Cardiovascular: Negative.   Gastrointestinal: Negative.   Endocrine: Negative.   Genitourinary: Negative.   Musculoskeletal: Negative.   Skin: Negative.   Allergic/Immunologic: Negative.   Neurological: Negative.   Hematological: Negative.   Psychiatric/Behavioral: Negative.   All other systems reviewed and are negative.     Objective:    Physical Exam Vitals reviewed.  Constitutional:      Appearance: Normal appearance.  HENT:     Mouth/Throat:     Mouth: Mucous membranes are moist.  Eyes:     Pupils: Pupils are equal, round, and reactive to light.  Neck:     Vascular: No carotid bruit.  Cardiovascular:     Rate and Rhythm: Normal rate and regular rhythm.     Pulses: Normal pulses.     Heart sounds: Normal heart sounds.  Pulmonary:     Effort: Pulmonary effort is normal.     Breath sounds: Normal breath sounds.  Abdominal:     General: Bowel sounds are normal.     Palpations: Abdomen is soft. There is no hepatomegaly, splenomegaly or mass.     Tenderness: There is no abdominal tenderness.     Hernia: No hernia is present.  Musculoskeletal:        General: No tenderness.     Cervical back: Neck supple.     Right lower leg: No edema.     Left lower leg: No edema.  Skin:    Findings: No rash.  Neurological:     Mental Status: She is alert and oriented to person, place, and time.     Motor: No weakness.  Psychiatric:        Mood and Affect: Mood and affect normal.        Behavior: Behavior normal.     BP 140/80   Pulse 64   Ht 4\' 11"  (1.499 m)   Wt 175 lb 8 oz (79.6 kg)   BMI 35.45 kg/m  Wt Readings from Last 3 Encounters:  09/10/20 175 lb 8 oz (79.6 kg)  07/16/20 179 lb 12.8 oz (81.6 kg)  04/15/20 179 lb 4.8 oz (81.3 kg)     Health Maintenance Due  Topic Date Due  . TETANUS/TDAP  Never done  . MAMMOGRAM  12/08/2018  . Fecal DNA (Cologuard)  05/12/2020    There are no preventive care reminders to display  for this patient.  Lab Results  Component Value  Date   TSH 1.610 09/20/2015   Lab Results  Component Value Date   WBC 5.4 04/28/2017   HGB 12.4 04/28/2017   HCT 38.4 04/28/2017   MCV 92.1 04/28/2017   PLT 185 04/28/2017   Lab Results  Component Value Date   NA 140 12/09/2017   K 4.7 12/09/2017   CO2 28 12/09/2017   GLUCOSE 89 12/09/2017   BUN 12 12/09/2017   CREATININE 0.69 12/09/2017   BILITOT 0.4 12/09/2017   ALKPHOS 55 08/31/2016   AST 13 12/09/2017   ALT 9 12/09/2017   PROT 7.2 12/09/2017   ALBUMIN 4.0 08/31/2016   CALCIUM 9.2 12/09/2017   Lab Results  Component Value Date   CHOL 183 07/20/2019   Lab Results  Component Value Date   HDL 33 (L) 07/20/2019   Lab Results  Component Value Date   LDLCALC 135 (H) 07/20/2019   Lab Results  Component Value Date   TRIG 74 07/20/2019   Lab Results  Component Value Date   CHOLHDL 5.5 07/20/2019   Lab Results  Component Value Date   HGBA1C 5.8 (H) 12/09/2017      Assessment & Plan:   Problem List Items Addressed This Visit      Cardiovascular and Mediastinum   Essential hypertension    - Today, the patient's blood pressure is well managed on norvasc. - The patient will continue the current treatment regimen.  - I encouraged the patient to eat a low-sodium diet to help control blood pressure. - I encouraged the patient to live an active lifestyle and complete activities that increases heart rate to 85% target heart rate at least 5 times per week for one hour.            Other   Annual physical exam    [name] has a normal physical exam without any abnormal findings.  Patient denies any chest pain shortness of breath or dyspnea.  She has a decrease in the memory which does not need any medication at the present time.  She was advised to take multivitamin walk on a daily basis.  Husband was told that he should watch her if she is cooking any meal.  She should not be driving.      Memory change     Patient has a mild decrease in the memory, she cannot do her household work.  Mini mental state examination was done and is as noted on the chart      Need for COVID-19 vaccine - Primary    Patient was advised to take Covid vaccine.      Relevant Orders   Pfizer SARS-COV-2 Vaccine (Completed)      No orders of the defined types were placed in this encounter.   Follow-up: No follow-ups on file.    Corky Downs, MD

## 2020-09-11 ENCOUNTER — Encounter: Payer: Medicare Other | Admitting: Internal Medicine

## 2020-09-15 ENCOUNTER — Encounter: Payer: Self-pay | Admitting: Internal Medicine

## 2020-09-15 NOTE — Assessment & Plan Note (Signed)
[name] has a normal physical exam without any abnormal findings.  Patient denies any chest pain shortness of breath or dyspnea.  She has a decrease in the memory which does not need any medication at the present time.  She was advised to take multivitamin walk on a daily basis.  Husband was told that he should watch her if she is cooking any meal.  She should not be driving.

## 2020-09-15 NOTE — Assessment & Plan Note (Signed)
Patient was advised to take Covid vaccine.

## 2020-09-15 NOTE — Assessment & Plan Note (Signed)
Patient has a mild decrease in the memory, she cannot do her household work.  Mini mental state examination was done and is as noted on the chart

## 2020-09-15 NOTE — Assessment & Plan Note (Signed)
-   Today, the patient's blood pressure is well managed on norvasc. - The patient will continue the current treatment regimen.  - I encouraged the patient to eat a low-sodium diet to help control blood pressure. - I encouraged the patient to live an active lifestyle and complete activities that increases heart rate to 85% target heart rate at least 5 times per week for one hour.    

## 2020-10-05 ENCOUNTER — Encounter: Payer: Self-pay | Admitting: Internal Medicine

## 2020-10-05 ENCOUNTER — Inpatient Hospital Stay
Admission: EM | Admit: 2020-10-05 | Discharge: 2020-10-11 | DRG: 481 | Disposition: A | Discharge status: Skilled Nursing Facility | Payer: Medicare Other | Attending: Internal Medicine | Admitting: Internal Medicine

## 2020-10-05 ENCOUNTER — Emergency Department: Payer: Medicare Other

## 2020-10-05 ENCOUNTER — Inpatient Hospital Stay: Payer: Medicare Other

## 2020-10-05 ENCOUNTER — Other Ambulatory Visit: Payer: Self-pay

## 2020-10-05 DIAGNOSIS — R339 Retention of urine, unspecified: Secondary | ICD-10-CM | POA: Diagnosis present

## 2020-10-05 DIAGNOSIS — W108XXA Fall (on) (from) other stairs and steps, initial encounter: Secondary | ICD-10-CM | POA: Diagnosis present

## 2020-10-05 DIAGNOSIS — F0280 Dementia in other diseases classified elsewhere without behavioral disturbance: Secondary | ICD-10-CM | POA: Diagnosis not present

## 2020-10-05 DIAGNOSIS — G309 Alzheimer's disease, unspecified: Secondary | ICD-10-CM | POA: Diagnosis not present

## 2020-10-05 DIAGNOSIS — W109XXA Fall (on) (from) unspecified stairs and steps, initial encounter: Secondary | ICD-10-CM | POA: Diagnosis present

## 2020-10-05 DIAGNOSIS — Z8249 Family history of ischemic heart disease and other diseases of the circulatory system: Secondary | ICD-10-CM

## 2020-10-05 DIAGNOSIS — E669 Obesity, unspecified: Secondary | ICD-10-CM | POA: Diagnosis present

## 2020-10-05 DIAGNOSIS — E6609 Other obesity due to excess calories: Secondary | ICD-10-CM | POA: Diagnosis not present

## 2020-10-05 DIAGNOSIS — Z841 Family history of disorders of kidney and ureter: Secondary | ICD-10-CM

## 2020-10-05 DIAGNOSIS — M810 Age-related osteoporosis without current pathological fracture: Secondary | ICD-10-CM | POA: Diagnosis present

## 2020-10-05 DIAGNOSIS — Z833 Family history of diabetes mellitus: Secondary | ICD-10-CM

## 2020-10-05 DIAGNOSIS — Y92009 Unspecified place in unspecified non-institutional (private) residence as the place of occurrence of the external cause: Secondary | ICD-10-CM | POA: Diagnosis not present

## 2020-10-05 DIAGNOSIS — K5909 Other constipation: Secondary | ICD-10-CM | POA: Diagnosis not present

## 2020-10-05 DIAGNOSIS — F0390 Unspecified dementia without behavioral disturbance: Secondary | ICD-10-CM | POA: Diagnosis present

## 2020-10-05 DIAGNOSIS — E559 Vitamin D deficiency, unspecified: Secondary | ICD-10-CM | POA: Diagnosis present

## 2020-10-05 DIAGNOSIS — I10 Essential (primary) hypertension: Secondary | ICD-10-CM | POA: Diagnosis present

## 2020-10-05 DIAGNOSIS — K5903 Drug induced constipation: Secondary | ICD-10-CM | POA: Diagnosis not present

## 2020-10-05 DIAGNOSIS — Z79899 Other long term (current) drug therapy: Secondary | ICD-10-CM | POA: Diagnosis not present

## 2020-10-05 DIAGNOSIS — Z823 Family history of stroke: Secondary | ICD-10-CM

## 2020-10-05 DIAGNOSIS — Y9331 Activity, mountain climbing, rock climbing and wall climbing: Secondary | ICD-10-CM

## 2020-10-05 DIAGNOSIS — E782 Mixed hyperlipidemia: Secondary | ICD-10-CM | POA: Diagnosis present

## 2020-10-05 DIAGNOSIS — Z811 Family history of alcohol abuse and dependence: Secondary | ICD-10-CM

## 2020-10-05 DIAGNOSIS — Z6830 Body mass index (BMI) 30.0-30.9, adult: Secondary | ICD-10-CM

## 2020-10-05 DIAGNOSIS — D62 Acute posthemorrhagic anemia: Secondary | ICD-10-CM | POA: Diagnosis not present

## 2020-10-05 DIAGNOSIS — S7291XA Unspecified fracture of right femur, initial encounter for closed fracture: Secondary | ICD-10-CM

## 2020-10-05 DIAGNOSIS — T40605A Adverse effect of unspecified narcotics, initial encounter: Secondary | ICD-10-CM | POA: Diagnosis not present

## 2020-10-05 DIAGNOSIS — R7301 Impaired fasting glucose: Secondary | ICD-10-CM | POA: Diagnosis present

## 2020-10-05 DIAGNOSIS — Z6835 Body mass index (BMI) 35.0-35.9, adult: Secondary | ICD-10-CM

## 2020-10-05 DIAGNOSIS — S72141A Displaced intertrochanteric fracture of right femur, initial encounter for closed fracture: Principal | ICD-10-CM | POA: Diagnosis present

## 2020-10-05 DIAGNOSIS — D649 Anemia, unspecified: Secondary | ICD-10-CM | POA: Diagnosis present

## 2020-10-05 DIAGNOSIS — E785 Hyperlipidemia, unspecified: Secondary | ICD-10-CM | POA: Diagnosis present

## 2020-10-05 DIAGNOSIS — Z20822 Contact with and (suspected) exposure to covid-19: Secondary | ICD-10-CM | POA: Diagnosis present

## 2020-10-05 DIAGNOSIS — S72009A Fracture of unspecified part of neck of unspecified femur, initial encounter for closed fracture: Secondary | ICD-10-CM

## 2020-10-05 LAB — CBC WITH DIFFERENTIAL/PLATELET
Abs Immature Granulocytes: 0.13 10*3/uL — ABNORMAL HIGH (ref 0.00–0.07)
Basophils Absolute: 0 10*3/uL (ref 0.0–0.1)
Basophils Relative: 0 %
Eosinophils Absolute: 0.1 10*3/uL (ref 0.0–0.5)
Eosinophils Relative: 1 %
HCT: 36.3 % (ref 36.0–46.0)
Hemoglobin: 12 g/dL (ref 12.0–15.0)
Immature Granulocytes: 1 %
Lymphocytes Relative: 29 %
Lymphs Abs: 2.8 10*3/uL (ref 0.7–4.0)
MCH: 30.5 pg (ref 26.0–34.0)
MCHC: 33.1 g/dL (ref 30.0–36.0)
MCV: 92.4 fL (ref 80.0–100.0)
Monocytes Absolute: 0.7 10*3/uL (ref 0.1–1.0)
Monocytes Relative: 7 %
Neutro Abs: 6.2 10*3/uL (ref 1.7–7.7)
Neutrophils Relative %: 62 %
Platelets: 199 10*3/uL (ref 150–400)
RBC: 3.93 MIL/uL (ref 3.87–5.11)
RDW: 13 % (ref 11.5–15.5)
WBC: 9.9 10*3/uL (ref 4.0–10.5)
nRBC: 0 % (ref 0.0–0.2)

## 2020-10-05 LAB — BASIC METABOLIC PANEL
Anion gap: 10 (ref 5–15)
BUN: 17 mg/dL (ref 8–23)
CO2: 24 mmol/L (ref 22–32)
Calcium: 8.9 mg/dL (ref 8.9–10.3)
Chloride: 102 mmol/L (ref 98–111)
Creatinine, Ser: 0.93 mg/dL (ref 0.44–1.00)
GFR, Estimated: >60 mL/min (ref >60)
Glucose, Bld: 116 mg/dL — ABNORMAL HIGH (ref 70–99)
Potassium: 3.7 mmol/L (ref 3.5–5.1)
Sodium: 136 mmol/L (ref 135–145)

## 2020-10-05 LAB — RESPIRATORY PANEL BY RT PCR (FLU A&B, COVID)
Influenza A by PCR: NEGATIVE
Influenza B by PCR: NEGATIVE
SARS Coronavirus 2 by RT PCR: NEGATIVE

## 2020-10-05 LAB — PROTIME-INR
INR: 1 (ref 0.8–1.2)
Prothrombin Time: 13 seconds (ref 11.4–15.2)

## 2020-10-05 MED ORDER — MORPHINE SULFATE (PF) 2 MG/ML IV SOLN
0.5000 mg | INTRAVENOUS | Status: DC | PRN
  Administered 2020-10-05: 0.5 mg via INTRAVENOUS
  Filled 2020-10-05: qty 1

## 2020-10-05 MED ORDER — ACETAMINOPHEN 325 MG PO TABS
650.0000 mg | ORAL_TABLET | Freq: Four times a day (QID) | ORAL | Status: DC | PRN
  Administered 2020-10-08: 650 mg via ORAL
  Filled 2020-10-05: qty 2

## 2020-10-05 MED ORDER — MEMANTINE HCL 5 MG PO TABS
5.0000 mg | ORAL_TABLET | Freq: Two times a day (BID) | ORAL | Status: DC
  Administered 2020-10-05 – 2020-10-11 (×11): 5 mg via ORAL
  Filled 2020-10-05 (×11): qty 1

## 2020-10-05 MED ORDER — ATORVASTATIN CALCIUM 10 MG PO TABS
10.0000 mg | ORAL_TABLET | Freq: Every day | ORAL | Status: DC
  Administered 2020-10-05 – 2020-10-10 (×6): 10 mg via ORAL
  Filled 2020-10-05 (×6): qty 1

## 2020-10-05 MED ORDER — MORPHINE SULFATE (PF) 2 MG/ML IV SOLN
1.0000 mg | INTRAVENOUS | Status: DC | PRN
  Administered 2020-10-06 (×2): 1 mg via INTRAVENOUS
  Filled 2020-10-05 (×2): qty 1

## 2020-10-05 MED ORDER — LORATADINE 10 MG PO TABS
10.0000 mg | ORAL_TABLET | Freq: Every day | ORAL | Status: DC
Start: 2020-10-05 — End: 2020-10-05

## 2020-10-05 MED ORDER — HYDROCODONE-ACETAMINOPHEN 5-325 MG PO TABS
1.0000 | ORAL_TABLET | Freq: Four times a day (QID) | ORAL | Status: DC | PRN
  Administered 2020-10-05: 2 via ORAL
  Filled 2020-10-05 (×2): qty 2

## 2020-10-05 MED ORDER — POLYETHYLENE GLYCOL 3350 17 G PO PACK
17.0000 g | PACK | Freq: Every day | ORAL | Status: DC | PRN
  Administered 2020-10-07: 17 g via ORAL
  Filled 2020-10-05 (×2): qty 1

## 2020-10-05 MED ORDER — ONDANSETRON HCL 4 MG/2ML IJ SOLN
4.0000 mg | Freq: Once | INTRAMUSCULAR | Status: AC
  Administered 2020-10-05: 4 mg via INTRAVENOUS
  Filled 2020-10-05: qty 2

## 2020-10-05 MED ORDER — LORATADINE 10 MG PO TABS
10.0000 mg | ORAL_TABLET | Freq: Every day | ORAL | Status: DC
  Administered 2020-10-07 – 2020-10-11 (×6): 10 mg via ORAL
  Filled 2020-10-05 (×5): qty 1

## 2020-10-05 MED ORDER — CEFAZOLIN SODIUM-DEXTROSE 2-4 GM/100ML-% IV SOLN
2.0000 g | INTRAVENOUS | Status: AC
  Administered 2020-10-06: 2 g via INTRAVENOUS
  Filled 2020-10-05: qty 100

## 2020-10-05 MED ORDER — VITAMIN D3 25 MCG (1000 UNIT) PO TABS
1000.0000 [IU] | ORAL_TABLET | Freq: Every day | ORAL | Status: DC
  Administered 2020-10-07 – 2020-10-11 (×5): 1000 [IU] via ORAL
  Filled 2020-10-05 (×12): qty 1

## 2020-10-05 MED ORDER — AMLODIPINE BESYLATE 5 MG PO TABS
5.0000 mg | ORAL_TABLET | Freq: Every day | ORAL | Status: DC
  Administered 2020-10-05 – 2020-10-11 (×6): 5 mg via ORAL
  Filled 2020-10-05 (×6): qty 1

## 2020-10-05 MED ORDER — MORPHINE SULFATE (PF) 4 MG/ML IV SOLN
4.0000 mg | Freq: Once | INTRAVENOUS | Status: AC
  Administered 2020-10-05: 4 mg via INTRAVENOUS
  Filled 2020-10-05: qty 1

## 2020-10-05 MED ORDER — IOHEXOL 300 MG/ML  SOLN
75.0000 mL | Freq: Once | INTRAMUSCULAR | Status: AC | PRN
  Administered 2020-10-05: 75 mL via INTRAVENOUS

## 2020-10-05 MED ORDER — OXYCODONE HCL 5 MG PO TABS
5.0000 mg | ORAL_TABLET | ORAL | Status: DC | PRN
  Administered 2020-10-06: 10 mg via ORAL
  Filled 2020-10-05: qty 2

## 2020-10-05 MED ORDER — ADULT MULTIVITAMIN W/MINERALS CH
1.0000 | ORAL_TABLET | Freq: Every day | ORAL | Status: DC
  Administered 2020-10-07 – 2020-10-11 (×5): 1 via ORAL
  Filled 2020-10-05 (×5): qty 1

## 2020-10-05 MED ORDER — DONEPEZIL HCL 5 MG PO TABS
5.0000 mg | ORAL_TABLET | Freq: Every day | ORAL | Status: DC
  Administered 2020-10-05 – 2020-10-10 (×6): 5 mg via ORAL
  Filled 2020-10-05 (×6): qty 1

## 2020-10-05 MED ORDER — METHOCARBAMOL 500 MG PO TABS
500.0000 mg | ORAL_TABLET | Freq: Four times a day (QID) | ORAL | Status: DC | PRN
  Filled 2020-10-05: qty 1

## 2020-10-05 MED ORDER — METHOCARBAMOL 1000 MG/10ML IJ SOLN
500.0000 mg | Freq: Four times a day (QID) | INTRAVENOUS | Status: DC | PRN
  Filled 2020-10-05: qty 5

## 2020-10-05 NOTE — ED Provider Notes (Signed)
[facility] Emergency Department Provider Note ____________________________________________  Time seen: Approximately 2:07 PM  I have reviewed the triage vital signs and the nursing notes.   HISTORY  Chief Complaint Fall (30 minutes ago) and Hip Pain (right hip pain with shortening/rotation.  )    HPI Cheyenne Parker is a 74 y.o. female who presents to the emergency department for evaluation and treatment of right hip pain after mechanical, nonsyncopal fall at home.  She was walking down her basement steps and was approximately 2 steps away from the bottom when she tripped and fell.  She denies striking her head or experiencing any loss of consciousness.  Not currently on any type of blood thinner.  She arrived via EMS who have established IV access and have given her 100 mics of fentanyl.   Past Medical History:  Diagnosis Date  . Anemia   . Hyperlipidemia   . IFG (impaired fasting glucose)   . Obesity   . Osteoporosis   . Vitamin D deficiency disease     Patient Active Problem List   Diagnosis Date Noted  . Intertrochanteric fracture of right femur, closed, initial encounter (HCC) 10/05/2020  . Dementia without behavioral disturbance (HCC) 10/05/2020  . Need for COVID-19 vaccine 09/10/2020  . Memory change 07/16/2020  . Essential hypertension 04/15/2020  . Low HDL (under 40) 04/28/2017  . Annual physical exam 09/21/2016  . Constipation 09/20/2015  . Elevated blood pressure, situational 09/20/2015  . Osteoporosis   . IFG (impaired fasting glucose)   . Mixed hyperlipidemia   . Obesity   . Hemorrhoid 07/20/2012  . Insomnia 07/20/2012    Past Surgical History:  Procedure Laterality Date  . ABDOMINAL HYSTERECTOMY     complete  . FRACTURE SURGERY      Prior to Admission medications   Medication Sig Start Date End Date Taking? Authorizing Provider  amLODipine (NORVASC) 5 MG tablet Take 1 tablet by mouth once daily Patient taking differently:  Take 5 mg by mouth daily.  08/19/20  Yes Masoud, Renda Rolls, MD  donepezil (ARICEPT) 5 MG tablet Take 5 mg by mouth daily. 09/18/20  Yes [provider]  memantine (NAMENDA) 5 MG tablet Take 5 mg by mouth 2 (two) times daily. 07/19/20  Yes [provider]  Multiple Vitamin (MULTIVITAMIN) tablet Take 1 tablet by mouth daily.   Yes [provider]    Allergies Patient has no known allergies.  Family History  Problem Relation Age of Onset  . Hypertension Mother   . Stroke Mother        possibly, died at 33 years of age  . Kidney disease Father   . Diabetes Father   . Heart disease Father   . Hypertension Father   . Alcohol abuse Father   . Alcohol abuse Daughter   . Memory loss Maternal Grandmother        died at age 20    Social History Social History   Tobacco Use  . Smoking status: Never Smoker  . Smokeless tobacco: Never Used  Vaping Use  . Vaping Use: Never used  Substance Use Topics  . Alcohol use: Yes    Comment: socially  . Drug use: No    Review of Systems Constitutional: Negative for fever. Cardiovascular: Negative for chest pain. Respiratory: Negative for shortness of breath. Musculoskeletal: Positive for right hip pain. Skin: Negative for open wound over the right hip, thigh or lower extremity.  Neurological: Negative for decrease in sensation  ____________________________________________  PHYSICAL EXAM:  VITAL SIGNS: ED Triage Vitals  Enc Vitals Group     BP 10/05/20 1401 (!) 154/59     Pulse --      Resp 10/05/20 1401 18     Temp --      Temp src --      SpO2 10/05/20 1401 96 %     Weight 10/05/20 1406 164 lb (74.4 kg)     Height 10/05/20 1406 5\' 2"  (1.575 m)     Head Circumference --      Peak Flow --      Pain Score 10/05/20 1405 5     Pain Loc --      Pain Edu? --      Excl. in GC? --     Constitutional: Alert and oriented. Well appearing and in no acute distress. Eyes: Conjunctivae are clear without discharge  or drainage Head: Atraumatic Neck: No focal midline tenderness. Respiratory: No cough. Respirations are even and unlabored. Musculoskeletal: Shortening and rotation of right lower extremity. DP and PT 2+. Neurologic: Motor and sensory function is intact. Awake, alert, and oriented.  Skin: No open wounds noted over right lower extremity including right hip  Psychiatric: Affect and behavior are appropriate.  ____________________________________________   LABS (all labs ordered are listed, but only abnormal results are displayed)  Labs Reviewed  SURGICAL PCR SCREEN - Abnormal; Notable for the following components:      Result Value   Staphylococcus aureus POSITIVE (*)    All other components within normal limits  BASIC METABOLIC PANEL - Abnormal; Notable for the following components:   Glucose, Bld 116 (*)    All other components within normal limits  CBC WITH DIFFERENTIAL/PLATELET - Abnormal; Notable for the following components:   Abs Immature Granulocytes 0.13 (*)    All other components within normal limits  CBC - Abnormal; Notable for the following components:   RBC 3.26 (*)    Hemoglobin 10.0 (*)    HCT 30.6 (*)    All other components within normal limits  BASIC METABOLIC PANEL - Abnormal; Notable for the following components:   Glucose, Bld 135 (*)    Calcium 8.7 (*)    Anion gap 4 (*)    All other components within normal limits  RESPIRATORY PANEL BY RT PCR (FLU A&B, COVID)  PROTIME-INR  URINALYSIS, COMPLETE (UACMP) WITH MICROSCOPIC  TYPE AND SCREEN   ____________________________________________  RADIOLOGY  Intertrochanteric right femur fracture.   I, Kem Boroughsari Lawarence Meek, personally viewed and evaluated these images (plain radiographs) as part of my medical decision making, as well as reviewing the written report by the radiologist.  DG Chest 1 View  Result Date: 10/05/2020 CLINICAL DATA:  Post fall. EXAM: CHEST  1 VIEW COMPARISON:  February 18, 2016 FINDINGS: Apparent  contour irregularity of the right heart border and right mediastinum. No evidence of cardiomegaly. There is no evidence of focal airspace consolidation, pleural effusion or pneumothorax. Osseous structures are without acute abnormality. Soft tissues are grossly normal. IMPRESSION: Apparent contour irregularity of the right heart border and right mediastinum. This may be projectional however it may represent a mediastinal mass or lymphadenopathy. Further evaluation with CT of the chest with contrast, with clinically feasible, is recommended. Electronically Signed   By: Ted Mcalpineobrinka  Dimitrova M.D.   On: 10/05/2020 14:51   CT CHEST W CONTRAST  Result Date: 10/05/2020 CLINICAL DATA:  Abnormal chest x-ray. EXAM: CT CHEST WITH CONTRAST TECHNIQUE: Multidetector CT imaging of the chest was performed  during intravenous contrast administration. CONTRAST:  41mL OMNIPAQUE IOHEXOL 300 MG/ML  SOLN COMPARISON:  Same day chest radiograph. FINDINGS: Cardiovascular: Scattered atherosclerotic calcifications throughout the aorta. Predominately LEFT-sided coronary artery atherosclerotic calcifications. Asymmetric enlargement of the main pulmonary artery in relation to the ascending thoracic aorta. No pericardial effusion. Heart is at the upper limits of normal in size. Mediastinum/Nodes: No enlarged mediastinal, hilar, or axillary lymph nodes. Thyroid gland, trachea, and esophagus demonstrate no significant findings. Lungs/Pleura: Bibasilar dependent consolidative opacities most consistent with atelectasis. No pleural effusion or pneumothorax. LEFT lower lobe pulmonary nodule measures 6 mm (series 2, image 61). Upper Abdomen: No acute abnormality.  Cholelithiasis. Musculoskeletal: Remote rib fractures. Age-indeterminate rib fracture of the RIGHT lateral third and fourth rib. Mild degenerative changes of the thoracic spine. 1 IMPRESSION: 1. Age-indeterminate rib fracture of the RIGHT lateral third and fourth rib. Correlate for point  tenderness. 2. Asymmetric enlargement of the main pulmonary artery in relation to the ascending thoracic aorta, which can be seen with pulmonary arterial hypertension. This may account for the contour abnormality on chest radiograph. 3. 6 mm LEFT lower lobe pulmonary nodule. Non-contrast chest CT at 6-12 months is recommended. If the nodule is stable at time of repeat CT, then future CT at 18-24 months (from today's scan) is considered optional for low-risk patients, but is recommended for high-risk patients. This recommendation follows the consensus statement: Guidelines for Management of Incidental Pulmonary Nodules Detected on CT Images: From the Fleischner Society 2017; Radiology 2017; 284:228-243. Aortic Atherosclerosis (ICD10-I70.0). Electronically Signed   By: Meda Klinefelter MD   On: 10/05/2020 17:34   DG Hip Unilat W or Wo Pelvis 2-3 Views Right  Result Date: 10/05/2020 CLINICAL DATA:  Post fall with rotation and shortening of the right leg. EXAM: DG HIP (WITH OR WITHOUT PELVIS) 2-3V RIGHT COMPARISON:  None. FINDINGS: There is a comminuted impacted fracture of the intratrochanteric portion of the right femur with butterfly fragment consisting of the lesser trochanter. Superior displacement of the distal fracture fragment. The right femoral head is normally located within the acetabulum. Mild osteoarthritic changes of the left hip. IMPRESSION: Comminuted impacted fracture of the intratrochanteric portion of the right femur. Electronically Signed   By: Ted Mcalpine M.D.   On: 10/05/2020 14:49   ____________________________________________   PROCEDURES  Procedures  ____________________________________________   INITIAL IMPRESSION / ASSESSMENT AND PLAN / ED COURSE  Cheyenne Parker is a 74 y.o. who presents to the emergency department for treatment and evaluation after falling from the 2nd step from the bottom of the basement steps. See HPI. Imaging and labs ordered.  Image of the  right hip reviewed. Obvious intertrochanteric fracture noted. Results discussed with the patient and husband. Plan will be to await official radiology reading and consult orthopedics. Patient confirms that she is not on blood thinners. She denies pain other than the right hip.   ----------------------------------------- 3:15 PM on 10/05/2020 -----------------------------------------  Consulted with Dr. Martha Clan.  Plan will be to admit her through the hospitalist service.  Hospitalist consult has been requested.    Pertinent labs & imaging results that were available during my care of the patient were reviewed by me and considered in my medical decision making (see chart for details).   _________________________________________   FINAL CLINICAL IMPRESSION(S) / ED DIAGNOSES  Final diagnoses:  Closed displaced intertrochanteric fracture of right femur, initial encounter [facility])    ED Discharge Orders    None       If controlled substance prescribed  during this visit, 12 month history viewed on the [facility] prior to issuing an initial prescription for Schedule II or III opiod.    Chinita Pester, FNP 10/06/20 1143    Merwyn Katos, MD 10/06/20 [phone]

## 2020-10-05 NOTE — Plan of Care (Signed)
  Problem: Education: Goal: Knowledge of General Education information will improve Description: Including pain rating scale, medication(s)/side effects and non-pharmacologic comfort measures Outcome: Progressing   Problem: Health Behavior/Discharge Planning: Goal: Ability to manage health-related needs will improve Outcome: Progressing   Problem: Clinical Measurements: Goal: Ability to maintain clinical measurements within normal limits will improve Outcome: Progressing Goal: Diagnostic test results will improve Outcome: Progressing   Problem: Elimination: Goal: Will not experience complications related to bowel motility Outcome: Progressing  Patient and family educated on plan of care and updates given. Pain controlled with PRN medication

## 2020-10-05 NOTE — Consult Note (Signed)
ORTHOPAEDIC CONSULTATION  REQUESTING PHYSICIAN: Lucile Shutters, MD  Chief Complaint: Right hip pain status post fall  HPI: Cheyenne Parker is a 74 y.o. female presents today after a fall at the bottom of her basement stairs.  It sounds as if the patient missed the step while going down stairs to do laundry.  Patient landed on her right hip.  She denies syncope.  She is seen in her hospital room this evening with her husband at the bedside.  Patient is complaining of right hip pain but denies other injuries.  She denies any numbness or tingling in the right lower extremity.  Past Medical History:  Diagnosis Date  . Anemia   . Hyperlipidemia   . IFG (impaired fasting glucose)   . Obesity   . Osteoporosis   . Vitamin D deficiency disease    Past Surgical History:  Procedure Laterality Date  . ABDOMINAL HYSTERECTOMY     complete  . FRACTURE SURGERY     Social History   Socioeconomic History  . Marital status: Married    Spouse name: Not on file  . Number of children: Not on file  . Years of education: Not on file  . Highest education level: Not on file  Occupational History  . Not on file  Tobacco Use  . Smoking status: Never Smoker  . Smokeless tobacco: Never Used  Vaping Use  . Vaping Use: Never used  Substance and Sexual Activity  . Alcohol use: Yes    Comment: socially  . Drug use: No  . Sexual activity: Not Currently  Other Topics Concern  . Not on file  Social History Narrative  . Not on file   Social Determinants of Health   Financial Resource Strain:   . Difficulty of Paying Living Expenses: Not on file  Food Insecurity:   . Worried About Programme researcher, broadcasting/film/video in the Last Year: Not on file  . Ran Out of Food in the Last Year: Not on file  Transportation Needs:   . Lack of Transportation (Medical): Not on file  . Lack of Transportation (Non-Medical): Not on file  Physical Activity:   . Days of Exercise per Week: Not on file  . Minutes of Exercise per  Session: Not on file  Stress:   . Feeling of Stress : Not on file  Social Connections:   . Frequency of Communication with Friends and Family: Not on file  . Frequency of Social Gatherings with Friends and Family: Not on file  . Attends Religious Services: Not on file  . Active Member of Clubs or Organizations: Not on file  . Attends Banker Meetings: Not on file  . Marital Status: Not on file   Family History  Problem Relation Age of Onset  . Hypertension Mother   . Stroke Mother        possibly, died at 19 years of age  . Kidney disease Father   . Diabetes Father   . Heart disease Father   . Hypertension Father   . Alcohol abuse Father   . Alcohol abuse Daughter   . Memory loss Maternal Grandmother        died at age 69   No Known Allergies Prior to Admission medications   Medication Sig Start Date End Date Taking? Authorizing Provider  amLODipine (NORVASC) 5 MG tablet Take 1 tablet by mouth once daily Patient taking differently: Take 5 mg by mouth daily.  08/19/20  Yes Corky Downs, MD  donepezil (ARICEPT) 5 MG tablet Take 5 mg by mouth daily. 09/18/20  Yes [provider]  memantine (NAMENDA) 5 MG tablet Take 5 mg by mouth 2 (two) times daily. 07/19/20  Yes [provider]  Multiple Vitamin (MULTIVITAMIN) tablet Take 1 tablet by mouth daily.   Yes [provider]   DG Chest 1 View  Result Date: 10/05/2020 CLINICAL DATA:  Post fall. EXAM: CHEST  1 VIEW COMPARISON:  February 18, 2016 FINDINGS: Apparent contour irregularity of the right heart border and right mediastinum. No evidence of cardiomegaly. There is no evidence of focal airspace consolidation, pleural effusion or pneumothorax. Osseous structures are without acute abnormality. Soft tissues are grossly normal. IMPRESSION: Apparent contour irregularity of the right heart border and right mediastinum. This may be projectional however it may represent a mediastinal mass or  lymphadenopathy. Further evaluation with CT of the chest with contrast, with clinically feasible, is recommended. Electronically Signed   By: Ted Mcalpine M.D.   On: 10/05/2020 14:51   CT CHEST W CONTRAST  Result Date: 10/05/2020 CLINICAL DATA:  Abnormal chest x-ray. EXAM: CT CHEST WITH CONTRAST TECHNIQUE: Multidetector CT imaging of the chest was performed during intravenous contrast administration. CONTRAST:  10mL OMNIPAQUE IOHEXOL 300 MG/ML  SOLN COMPARISON:  Same day chest radiograph. FINDINGS: Cardiovascular: Scattered atherosclerotic calcifications throughout the aorta. Predominately LEFT-sided coronary artery atherosclerotic calcifications. Asymmetric enlargement of the main pulmonary artery in relation to the ascending thoracic aorta. No pericardial effusion. Heart is at the upper limits of normal in size. Mediastinum/Nodes: No enlarged mediastinal, hilar, or axillary lymph nodes. Thyroid gland, trachea, and esophagus demonstrate no significant findings. Lungs/Pleura: Bibasilar dependent consolidative opacities most consistent with atelectasis. No pleural effusion or pneumothorax. LEFT lower lobe pulmonary nodule measures 6 mm (series 2, image 61). Upper Abdomen: No acute abnormality.  Cholelithiasis. Musculoskeletal: Remote rib fractures. Age-indeterminate rib fracture of the RIGHT lateral third and fourth rib. Mild degenerative changes of the thoracic spine. 1 IMPRESSION: 1. Age-indeterminate rib fracture of the RIGHT lateral third and fourth rib. Correlate for point tenderness. 2. Asymmetric enlargement of the main pulmonary artery in relation to the ascending thoracic aorta, which can be seen with pulmonary arterial hypertension. This may account for the contour abnormality on chest radiograph. 3. 6 mm LEFT lower lobe pulmonary nodule. Non-contrast chest CT at 6-12 months is recommended. If the nodule is stable at time of repeat CT, then future CT at 18-24 months (from today's scan) is  considered optional for low-risk patients, but is recommended for high-risk patients. This recommendation follows the consensus statement: Guidelines for Management of Incidental Pulmonary Nodules Detected on CT Images: From the Fleischner Society 2017; Radiology 2017; 284:228-243. Aortic Atherosclerosis (ICD10-I70.0). Electronically Signed   By: Meda Klinefelter MD   On: 10/05/2020 17:34   DG Hip Unilat W or Wo Pelvis 2-3 Views Right  Result Date: 10/05/2020 CLINICAL DATA:  Post fall with rotation and shortening of the right leg. EXAM: DG HIP (WITH OR WITHOUT PELVIS) 2-3V RIGHT COMPARISON:  None. FINDINGS: There is a comminuted impacted fracture of the intratrochanteric portion of the right femur with butterfly fragment consisting of the lesser trochanter. Superior displacement of the distal fracture fragment. The right femoral head is normally located within the acetabulum. Mild osteoarthritic changes of the left hip. IMPRESSION: Comminuted impacted fracture of the intratrochanteric portion of the right femur. Electronically Signed   By: Ted Mcalpine M.D.   On: 10/05/2020 14:49    Positive ROS: All other systems have  been reviewed and were otherwise negative with the exception of those mentioned in the HPI and as above.  Physical Exam: General: Awake, but drowsy, no acute distress  MUSCULOSKELETAL: Right lower extremity: Patient skin is intact.  There is no erythema ecchymosis or swelling.  Patient has shortening and external rotation to the right lower extremity.  She has palpable pedal pulses, intact/light touch and intact motor function distally.  Her thigh and leg compartments are soft and compressible.  Assessment: Comminuted, displaced right intertrochanteric hip fracture  Plan: I explained to the patient and her husband the nature of her fracture.  I have recommended intramedullary fixation for this fracture.  I reviewed the details of the surgery as well as the postoperative  course with him.  I discussed the risks and benefits of surgery. The risks include but are not limited to infection, bleeding requiring blood transfusion, nerve or blood vessel injury, joint stiffness or loss of motion, persistent pain, weakness or instability, malunion, nonunion and hardware failure and the need for further surgery. Medical risks include but are not limited to DVT and pulmonary embolism, myocardial infarction, stroke, pneumonia, respiratory failure and death. Patient and her husband understood these risks and wished to proceed.   I reviewed the labs and radiographic studies in preparation for this case.  Patient is not on anticoagulation at baseline.  Of any anticoagulation therapy tonight in preparation for surgery tomorrow.  Surgery is tentatively scheduled for tomorrow at 11 AM.  Patient is to be n.p.o. after midnight.  Patient is complaining of right hip pain.  I have adjusted the pain medication to allow for 1 mg of morphine every 2 hours as needed as well as discontinued the Norco and replaced it with oxycodone 5 to 10 mg every 4 hours as needed for pain and Tylenol 650 mg every 6 hours as needed for pain.     Juanell Fairly, MD    10/05/2020 8:23 PM

## 2020-10-05 NOTE — ED Notes (Signed)
Attempted to call report, RN to call back.

## 2020-10-05 NOTE — ED Triage Notes (Signed)
Pt brought in via EMS, states pt had a mechanical fall, falling down 2-3 steps at home, balance lost.  Pt denies LOC, denies use of blood thinners.  Pt c/o right hip pain.

## 2020-10-05 NOTE — H&P (Signed)
History and Physical    Cheyenne Parker:[id number] DOB: 1946/07/13 DOA: 10/05/2020  PCP: Corky Downs, MD   Patient coming from: Home  I have personally briefly reviewed patient's old medical records in [facility]  Chief Complaint: S/p Fall                                Right hip pain  HPI: Cheyenne Parker is a 74 y.o. female with medical history significant for anemia, Obesity, dyslipidemia who was brought into the ER for evaluation of right hip pain after a mechanical fall at home. Patient states that she was going down to the basement to do laundry when she tripped and fell down 2 steps away from the bottom.  She denies having any loss of consciousness and landed on her right side.  She developed severe pain in her right hip and was unable to get up or even bear weight on that left lower extremity.  EMS was called and patient received 100 mics of fentanyl prior to arrival in the ER. She denies having any chest pain, no shortness of breath, no dizziness, no lightheadedness, no nausea, no vomiting, no abdominal pain, no changes in her bowel habits, no urinary symptoms, no fever or chills. Labs show sodium 136, potassium 3.7, chloride 102, bicarb 24, glucose 116, BUN 17, creatinine 0.93, calcium 8, white count 9.9, hemoglobin 12.3, hematocrit 36.3, MCV 92.4, RDW 13, platelet count 199, PT 13.0, INR 1.0 Respiratory viral panel is negative Chest x-ray reviewed by me shows no evidence of acute cardiopulmonary disease. Twelve-lead EKG shows normal sinus rhythm X-ray of the right femur shows comminuted impacted fracture of the intratrochanteric portion of the right femur    ED Course: Patient is a 74 year old Caucasian female who was brought into the emergency room after mechanical fall and found to have an intratrochanteric fracture of the right femur.  She will be admitted to the hospital for further evaluation.  Review of Systems: As per HPI otherwise 10 point review of systems  negative.   Past Medical History:  Diagnosis Date  . Anemia   . Hyperlipidemia   . IFG (impaired fasting glucose)   . Obesity   . Osteoporosis   . Vitamin D deficiency disease     Past Surgical History:  Procedure Laterality Date  . ABDOMINAL HYSTERECTOMY     complete  . FRACTURE SURGERY       reports that she has never smoked. She has never used smokeless tobacco. She reports current alcohol use. She reports that she does not use drugs.  No Known Allergies  Family History  Problem Relation Age of Onset  . Hypertension Mother   . Stroke Mother        possibly, died at 75 years of age  . Kidney disease Father   . Diabetes Father   . Heart disease Father   . Hypertension Father   . Alcohol abuse Father   . Alcohol abuse Daughter   . Memory loss Maternal Grandmother        died at age 23     Prior to Admission medications   Medication Sig Start Date End Date Taking? Authorizing Provider  amLODipine (NORVASC) 5 MG tablet Take 1 tablet by mouth once daily Patient taking differently: Take 5 mg by mouth daily.  08/19/20  Yes Masoud, Renda Rolls, MD  donepezil (ARICEPT) 5 MG tablet Take 5 mg by mouth  daily. 09/18/20  Yes [provider]  memantine (NAMENDA) 5 MG tablet Take 5 mg by mouth 2 (two) times daily. 07/19/20  Yes [provider]  Multiple Vitamin (MULTIVITAMIN) tablet Take 1 tablet by mouth daily.   Yes [provider]    Physical Exam: Vitals:   10/05/20 1411 10/05/20 1415 10/05/20 1445 10/05/20 1530  BP:  (!) 156/53 (!) 158/75 (!) 148/80  Pulse: 76 70 77 74  Resp:  20 16 (!) 23  Temp: 97.8 F (36.6 C)     TempSrc: Axillary     SpO2:  95% 98% 100%  Weight:      Height:         Vitals:   10/05/20 1411 10/05/20 1415 10/05/20 1445 10/05/20 1530  BP:  (!) 156/53 (!) 158/75 (!) 148/80  Pulse: 76 70 77 74  Resp:  20 16 (!) 23  Temp: 97.8 F (36.6 C)     TempSrc: Axillary     SpO2:  95% 98% 100%  Weight:      Height:         Constitutional: NAD, alert and oriented x 3 Eyes: PERRL, lids and conjunctivae normal ENMT: Mucous membranes are moist.  Neck: normal, supple, no masses, no thyromegaly Respiratory: clear to auscultation bilaterally, no wheezing, no crackles. Normal respiratory effort. No accessory muscle use.  Cardiovascular: Regular rate and rhythm, no murmurs / rubs / gallops. No extremity edema. 2+ pedal pulses. No carotid bruits.  Abdomen: no tenderness, no masses palpated. No hepatosplenomegaly. Bowel sounds positive.  Musculoskeletal: no clubbing / cyanosis.  Shortening of the right lower extremity.  Decreased range of motion right hip character with Skin: no rashes, lesions, ulcers.  Neurologic: No gross focal neurologic deficit. Psychiatric: Normal mood and affect.   Labs on Admission: I have personally reviewed following labs and imaging studies  CBC: Recent Labs  Lab 10/05/20 1405  WBC 9.9  NEUTROABS 6.2  HGB 12.0  HCT 36.3  MCV 92.4  PLT 199   Basic Metabolic Panel: Recent Labs  Lab 10/05/20 1405  NA 136  K 3.7  CL 102  CO2 24  GLUCOSE 116*  BUN 17  CREATININE 0.93  CALCIUM 8.9   GFR: Estimated Creatinine Clearance: 50.9 mL/min (by C-G formula based on SCr of 0.93 mg/dL). Liver Function Tests: No results for input(s): AST, ALT, ALKPHOS, BILITOT, PROT, ALBUMIN in the last 168 hours. No results for input(s): LIPASE, AMYLASE in the last 168 hours. No results for input(s): AMMONIA in the last 168 hours. Coagulation Profile: Recent Labs  Lab 10/05/20 1405  INR 1.0   Cardiac Enzymes: No results for input(s): CKTOTAL, CKMB, CKMBINDEX, TROPONINI in the last 168 hours. BNP (last 3 results) No results for input(s): PROBNP in the last 8760 hours. HbA1C: No results for input(s): HGBA1C in the last 72 hours. CBG: No results for input(s): GLUCAP in the last 168 hours. Lipid Profile: No results for input(s): CHOL, HDL, LDLCALC, TRIG, CHOLHDL, LDLDIRECT in the last 72  hours. Thyroid Function Tests: No results for input(s): TSH, T4TOTAL, FREET4, T3FREE, THYROIDAB in the last 72 hours. Anemia Panel: No results for input(s): VITAMINB12, FOLATE, FERRITIN, TIBC, IRON, RETICCTPCT in the last 72 hours. Urine analysis: No results found for: COLORURINE, APPEARANCEUR, LABSPEC, PHURINE, GLUCOSEU, HGBUR, BILIRUBINUR, KETONESUR, PROTEINUR, UROBILINOGEN, NITRITE, LEUKOCYTESUR  Radiological Exams on Admission: DG Chest 1 View  Result Date: 10/05/2020 CLINICAL DATA:  Post fall. EXAM: CHEST  1 VIEW COMPARISON:  February 18, 2016 FINDINGS: Apparent contour  irregularity of the right heart border and right mediastinum. No evidence of cardiomegaly. There is no evidence of focal airspace consolidation, pleural effusion or pneumothorax. Osseous structures are without acute abnormality. Soft tissues are grossly normal. IMPRESSION: Apparent contour irregularity of the right heart border and right mediastinum. This may be projectional however it may represent a mediastinal mass or lymphadenopathy. Further evaluation with CT of the chest with contrast, with clinically feasible, is recommended. Electronically Signed   By: Ted Mcalpine M.D.   On: 10/05/2020 14:51   DG Hip Unilat W or Wo Pelvis 2-3 Views Right  Result Date: 10/05/2020 CLINICAL DATA:  Post fall with rotation and shortening of the right leg. EXAM: DG HIP (WITH OR WITHOUT PELVIS) 2-3V RIGHT COMPARISON:  None. FINDINGS: There is a comminuted impacted fracture of the intratrochanteric portion of the right femur with butterfly fragment consisting of the lesser trochanter. Superior displacement of the distal fracture fragment. The right femoral head is normally located within the acetabulum. Mild osteoarthritic changes of the left hip. IMPRESSION: Comminuted impacted fracture of the intratrochanteric portion of the right femur. Electronically Signed   By: Ted Mcalpine M.D.   On: 10/05/2020 14:49    EKG: Independently  reviewed.  Sinus rhythm   Assessment/Plan Principal Problem:   Intertrochanteric fracture of right femur, closed, initial encounter (HCC) Active Problems:   Obesity   Essential hypertension   Dementia without behavioral disturbance (HCC)     Intertrochanteric fracture of right femur Status post fall Immobilize right lower extremity Pain control Muscle relaxants Consult orthopedic surgery Place patient on fall precautions    Obesity (BMI 30) Complicates overall prognosis and care    Hypertension Continue amlodipine   Dementia Continue Aricept and Namenda      DVT prophylaxis: SCD Code Status: Full code Family Communication: Greater than 50% of time was spent discussing patient's condition and plan of care with her and her husband at the bedside.  All questions and concerns have been addressed.  They verbalized understanding and agree with the plan.  CODE STATUS was discussed and she is a full code Disposition Plan: Skilled nursing facility Consults called: Orthopedic surgery    Kadisha Goodine MD [facility]     10/05/2020, 5:01 PM

## 2020-10-06 ENCOUNTER — Encounter: Payer: Self-pay | Admitting: Internal Medicine

## 2020-10-06 ENCOUNTER — Inpatient Hospital Stay: Payer: Medicare Other

## 2020-10-06 ENCOUNTER — Inpatient Hospital Stay: Payer: Medicare Other | Admitting: Anesthesiology

## 2020-10-06 ENCOUNTER — Encounter
Admission: EM | Disposition: A | Discharge status: Skilled Nursing Facility | Payer: Self-pay | Source: Home / Self Care | Attending: Internal Medicine

## 2020-10-06 DIAGNOSIS — S72141A Displaced intertrochanteric fracture of right femur, initial encounter for closed fracture: Secondary | ICD-10-CM | POA: Diagnosis not present

## 2020-10-06 HISTORY — PX: INTRAMEDULLARY (IM) NAIL INTERTROCHANTERIC: SHX5875

## 2020-10-06 LAB — CBC
HCT: 30.6 % — ABNORMAL LOW (ref 36.0–46.0)
Hemoglobin: 10 g/dL — ABNORMAL LOW (ref 12.0–15.0)
MCH: 30.7 pg (ref 26.0–34.0)
MCHC: 32.7 g/dL (ref 30.0–36.0)
MCV: 93.9 fL (ref 80.0–100.0)
Platelets: 186 10*3/uL (ref 150–400)
RBC: 3.26 MIL/uL — ABNORMAL LOW (ref 3.87–5.11)
RDW: 13.1 % (ref 11.5–15.5)
WBC: 10 10*3/uL (ref 4.0–10.5)
nRBC: 0 % (ref 0.0–0.2)

## 2020-10-06 LAB — BASIC METABOLIC PANEL
Anion gap: 4 — ABNORMAL LOW (ref 5–15)
BUN: 18 mg/dL (ref 8–23)
CO2: 27 mmol/L (ref 22–32)
Calcium: 8.7 mg/dL — ABNORMAL LOW (ref 8.9–10.3)
Chloride: 105 mmol/L (ref 98–111)
Creatinine, Ser: 0.84 mg/dL (ref 0.44–1.00)
GFR, Estimated: >60 mL/min (ref >60)
Glucose, Bld: 135 mg/dL — ABNORMAL HIGH (ref 70–99)
Potassium: 4.7 mmol/L (ref 3.5–5.1)
Sodium: 136 mmol/L (ref 135–145)

## 2020-10-06 LAB — SURGICAL PCR SCREEN
MRSA, PCR: NEGATIVE
Staphylococcus aureus: POSITIVE — AB

## 2020-10-06 SURGERY — FIXATION, FRACTURE, INTERTROCHANTERIC, WITH INTRAMEDULLARY ROD
Anesthesia: Spinal | Site: Leg Upper | Laterality: Right

## 2020-10-06 MED ORDER — MAGNESIUM CITRATE PO SOLN
1.0000 | Freq: Once | ORAL | Status: DC | PRN
  Filled 2020-10-06: qty 296

## 2020-10-06 MED ORDER — SENNA 8.6 MG PO TABS
1.0000 | ORAL_TABLET | Freq: Two times a day (BID) | ORAL | Status: DC
  Administered 2020-10-06 – 2020-10-09 (×7): 8.6 mg via ORAL
  Filled 2020-10-06 (×7): qty 1

## 2020-10-06 MED ORDER — KETAMINE HCL 10 MG/ML IJ SOLN
INTRAMUSCULAR | Status: DC | PRN
  Administered 2020-10-06 (×2): 25 mg via INTRAVENOUS

## 2020-10-06 MED ORDER — SODIUM CHLORIDE 0.9 % IV SOLN
75.0000 mL/h | INTRAVENOUS | Status: DC
  Administered 2020-10-06 – 2020-10-10 (×3): 75 mL/h via INTRAVENOUS

## 2020-10-06 MED ORDER — HYDROCODONE-ACETAMINOPHEN 7.5-325 MG PO TABS: 1.0000 | ORAL_TABLET | ORAL | Status: DC | PRN

## 2020-10-06 MED ORDER — DOCUSATE SODIUM 100 MG PO CAPS
100.0000 mg | ORAL_CAPSULE | Freq: Two times a day (BID) | ORAL | Status: DC
  Administered 2020-10-06 – 2020-10-09 (×7): 100 mg via ORAL
  Filled 2020-10-06 (×7): qty 1

## 2020-10-06 MED ORDER — PHENYLEPHRINE HCL (PRESSORS) 10 MG/ML IV SOLN
INTRAVENOUS | Status: DC | PRN
  Administered 2020-10-06 (×2): 100 ug via INTRAVENOUS

## 2020-10-06 MED ORDER — BISACODYL 10 MG RE SUPP
10.0000 mg | Freq: Every day | RECTAL | Status: DC | PRN
  Administered 2020-10-11: 10 mg via RECTAL
  Filled 2020-10-06: qty 1

## 2020-10-06 MED ORDER — PROPOFOL 500 MG/50ML IV EMUL
INTRAVENOUS | Status: DC | PRN
  Administered 2020-10-06: 50 ug/kg/min via INTRAVENOUS

## 2020-10-06 MED ORDER — BUPIVACAINE HCL (PF) 0.5 % IJ SOLN
INTRAMUSCULAR | Status: DC | PRN
  Administered 2020-10-06: 2.5 mL

## 2020-10-06 MED ORDER — ONDANSETRON HCL 4 MG PO TABS: 4.0000 mg | ORAL_TABLET | Freq: Four times a day (QID) | ORAL | Status: DC | PRN

## 2020-10-06 MED ORDER — FENTANYL CITRATE (PF) 100 MCG/2ML IJ SOLN: 25.0000 ug | INTRAMUSCULAR | Status: DC | PRN

## 2020-10-06 MED ORDER — OXYCODONE HCL 5 MG/5ML PO SOLN: 5.0000 mg | Freq: Once | ORAL | Status: DC | PRN

## 2020-10-06 MED ORDER — MORPHINE SULFATE (PF) 2 MG/ML IV SOLN
0.5000 mg | INTRAVENOUS | Status: DC | PRN
  Administered 2020-10-08: 1 mg via INTRAVENOUS
  Filled 2020-10-06: qty 1

## 2020-10-06 MED ORDER — OXYCODONE HCL 5 MG PO TABS: 5.0000 mg | ORAL_TABLET | Freq: Once | ORAL | Status: DC | PRN

## 2020-10-06 MED ORDER — ALUM & MAG HYDROXIDE-SIMETH 200-200-20 MG/5ML PO SUSP: 30.0000 mL | ORAL | Status: DC | PRN

## 2020-10-06 MED ORDER — TRAMADOL HCL 50 MG PO TABS
50.0000 mg | ORAL_TABLET | Freq: Four times a day (QID) | ORAL | Status: DC
  Administered 2020-10-06 – 2020-10-09 (×9): 50 mg via ORAL
  Filled 2020-10-06 (×10): qty 1

## 2020-10-06 MED ORDER — MUPIROCIN 2 % EX OINT
TOPICAL_OINTMENT | Freq: Two times a day (BID) | CUTANEOUS | Status: DC
  Filled 2020-10-06: qty 22

## 2020-10-06 MED ORDER — SODIUM CHLORIDE 0.9 % IV SOLN
INTRAVENOUS | Status: DC | PRN
  Administered 2020-10-06: 25 ug/min via INTRAVENOUS

## 2020-10-06 MED ORDER — LACTATED RINGERS IV SOLN: INTRAVENOUS | Status: DC | PRN

## 2020-10-06 MED ORDER — GENTAMICIN SULFATE 40 MG/ML IJ SOLN
INTRAMUSCULAR | Status: AC
  Filled 2020-10-06: qty 2

## 2020-10-06 MED ORDER — PROPOFOL 500 MG/50ML IV EMUL
INTRAVENOUS | Status: AC
  Filled 2020-10-06: qty 50

## 2020-10-06 MED ORDER — ONDANSETRON HCL 4 MG/2ML IJ SOLN: 4.0000 mg | Freq: Four times a day (QID) | INTRAMUSCULAR | Status: DC | PRN

## 2020-10-06 MED ORDER — MIDAZOLAM HCL 2 MG/2ML IJ SOLN
INTRAMUSCULAR | Status: AC
  Filled 2020-10-06: qty 2

## 2020-10-06 MED ORDER — CEFAZOLIN SODIUM-DEXTROSE 2-4 GM/100ML-% IV SOLN
2.0000 g | Freq: Four times a day (QID) | INTRAVENOUS | Status: AC
  Administered 2020-10-06 – 2020-10-07 (×2): 2 g via INTRAVENOUS
  Filled 2020-10-06 (×2): qty 100

## 2020-10-06 MED ORDER — HYDROCODONE-ACETAMINOPHEN 5-325 MG PO TABS
1.0000 | ORAL_TABLET | ORAL | Status: DC | PRN
  Administered 2020-10-06 – 2020-10-09 (×5): 1 via ORAL
  Filled 2020-10-06 (×5): qty 1

## 2020-10-06 MED ORDER — PROPOFOL 10 MG/ML IV BOLUS
INTRAVENOUS | Status: AC
  Filled 2020-10-06: qty 40

## 2020-10-06 MED ORDER — KETAMINE HCL 50 MG/ML IJ SOLN
INTRAMUSCULAR | Status: AC
  Filled 2020-10-06: qty 10

## 2020-10-06 MED ORDER — ENOXAPARIN SODIUM 40 MG/0.4ML ~~LOC~~ SOLN
40.0000 mg | SUBCUTANEOUS | Status: DC
  Administered 2020-10-07 – 2020-10-11 (×5): 40 mg via SUBCUTANEOUS
  Filled 2020-10-06 (×5): qty 0.4

## 2020-10-06 MED ORDER — SODIUM CHLORIDE 0.9 % IR SOLN
Status: DC | PRN
  Administered 2020-10-06: 250 mL

## 2020-10-06 SURGICAL SUPPLY — 45 items
BIT DRILL 4.3MMS DISTAL GRDTED (BIT) ×1 IMPLANT
BNDG COHESIVE 6X5 TAN STRL LF (GAUZE/BANDAGES/DRESSINGS) ×4 IMPLANT
CANISTER SUCT 1200ML W/VALVE (MISCELLANEOUS) ×2 IMPLANT
COVER WAND RF STERILE (DRAPES) ×2 IMPLANT
DRAPE 3/4 80X56 (DRAPES) ×4 IMPLANT
DRAPE SURG 17X11 SM STRL (DRAPES) ×4 IMPLANT
DRAPE SURG 17X23 STRL (DRAPES) ×4 IMPLANT
DRAPE U-SHAPE 47X51 STRL (DRAPES) ×2 IMPLANT
DRILL 4.3MMS DISTAL GRADUATED (BIT) ×2
DRSG OPSITE POSTOP 3X4 (GAUZE/BANDAGES/DRESSINGS) ×4 IMPLANT
DRSG OPSITE POSTOP 4X14 (GAUZE/BANDAGES/DRESSINGS) IMPLANT
DRSG OPSITE POSTOP 4X6 (GAUZE/BANDAGES/DRESSINGS) ×2 IMPLANT
DRSG OPSITE POSTOP 4X8 (GAUZE/BANDAGES/DRESSINGS) ×2 IMPLANT
DURAPREP 26ML APPLICATOR (WOUND CARE) ×4 IMPLANT
ELECT REM PT RETURN 9FT ADLT (ELECTROSURGICAL) ×2
ELECTRODE REM PT RTRN 9FT ADLT (ELECTROSURGICAL) ×1 IMPLANT
GAUZE XEROFORM 1X8 LF (GAUZE/BANDAGES/DRESSINGS) ×2 IMPLANT
GLOVE BIOGEL PI IND STRL 9 (GLOVE) ×1 IMPLANT
GLOVE BIOGEL PI INDICATOR 9 (GLOVE) ×1
GLOVE SURG 9.0 ORTHO LTXF (GLOVE) ×4 IMPLANT
GOWN STRL REUS TWL 2XL XL LVL4 (GOWN DISPOSABLE) ×2 IMPLANT
GOWN STRL REUS W/ TWL LRG LVL3 (GOWN DISPOSABLE) ×1 IMPLANT
GOWN STRL REUS W/TWL LRG LVL3 (GOWN DISPOSABLE) ×1
GUIDEPIN VERSANAIL DSP 3.2X444 (ORTHOPEDIC DISPOSABLE SUPPLIES) ×2 IMPLANT
GUIDEWIRE BALL NOSE 100CM (WIRE) ×2 IMPLANT
HEMOVAC 400CC 10FR (MISCELLANEOUS) IMPLANT
HFN RH 130 DEG 11MM X 360MM (Orthopedic Implant) ×2 IMPLANT
KIT TURNOVER CYSTO (KITS) ×2 IMPLANT
MANIFOLD NEPTUNE II (INSTRUMENTS) ×2 IMPLANT
MAT ABSORB  FLUID 56X50 GRAY (MISCELLANEOUS) ×1
MAT ABSORB FLUID 56X50 GRAY (MISCELLANEOUS) ×1 IMPLANT
NS IRRIG 1000ML POUR BTL (IV SOLUTION) ×2 IMPLANT
PACK HIP COMPR (MISCELLANEOUS) ×2 IMPLANT
PAD ARMBOARD 7.5X6 YLW CONV (MISCELLANEOUS) ×2 IMPLANT
SCREW BONE CORTICAL 5.0X44 (Screw) ×2 IMPLANT
SCREW LAG 10.5MMX105MM HFN (Screw) ×2 IMPLANT
STAPLER SKIN PROX 35W (STAPLE) ×2 IMPLANT
SUCTION FRAZIER HANDLE 10FR (MISCELLANEOUS) ×1
SUCTION TUBE FRAZIER 10FR DISP (MISCELLANEOUS) ×1 IMPLANT
SUT VIC AB 0 CT1 36 (SUTURE) ×4 IMPLANT
SUT VIC AB 2-0 CT1 27 (SUTURE) ×1
SUT VIC AB 2-0 CT1 TAPERPNT 27 (SUTURE) ×1 IMPLANT
SUT VICRYL 0 AB UR-6 (SUTURE) ×4 IMPLANT
SYR 30ML LL (SYRINGE) ×2 IMPLANT
SYR BULB IRRIG 60ML STRL (SYRINGE) ×2 IMPLANT

## 2020-10-06 NOTE — Progress Notes (Signed)
PROGRESS NOTE    Cheyenne Parker  MCN:[id number] DOB: 01/20/1946 DOA: 10/05/2020 PCP: Corky Downs, MD   Brief Narrative:  Patient is 74 year old female with past medical history of hypertension, hyperlipidemia, dementia, obesity with BMI of 30 presents to emergency department for evaluation of right hip pain after a mechanical fall at home.  ED course: Upon arrival: Patient's vital signs are stable, initial lab status CBC, BMP: WNL, COVID-19 negative.  X-ray of right hip shows comminuted impacted fracture of intertrochanteric portion of right femur.  Orthopedic surgery consulted.  Patient admitted for further evaluation and management of her right femur fracture.  Assessment & Plan:   Right intertrochanteric femur fracture: -Status post mechanical fall. -Patient is n.p.o. for the procedure this morning. -Orthopedic surgery consulted-plan is for intramedullary fixation of fracture. -Continue with pain medication as needed. -Monitor H&H and vitals closely  Hypertension: Stable -Continue amlodipine.  Dementia: -Continue movements of Aricept and Namenda  Hyperlipidemia: Continue statin.  Obesity with BMI of 30: -Diet modification/exercise and weight loss recommended.  DVT prophylaxis: SCD  code Status: Full code Family Communication:  None present at bedside.  Plan of care discussed with patient in length and she verbalized understanding and agreed with it. Disposition Plan: Home versus SNF  Consultants:   Orthopedic surgery  Procedures:  Intramedullary fixation of right intertrochanteric hip fracture  Antimicrobials:   Cefazolin  Status is: Inpatient  Dispo: The patient is from: Home              Anticipated d/c is to: SNF              Anticipated d/c date is: 2 days              Patient currently is not medically stable to d/c.   Subjective: Patient seen and examined.  Complaining of right hip pain denies any other symptoms such as headache, chest pain,  shortness of breath, palpitation, fever, chills, nausea or vomiting.  Objective: Vitals:   10/05/20 2142 10/06/20 0000 10/06/20 0423 10/06/20 0752  BP: 122/67 128/63 129/64 128/67  Pulse: 86 86 85 80  Resp: 16 18 16 18   Temp: 97.9 F (36.6 C) 97.8 F (36.6 C) 97.8 F (36.6 C) 98.5 F (36.9 C)  TempSrc: Oral Oral Oral Oral  SpO2:    94%  Weight:      Height:        Intake/Output Summary (Last 24 hours) at 10/06/2020 1329 Last data filed at 10/06/2020 1310 Gross per 24 hour  Intake 700 ml  Output 525 ml  Net 175 ml   Filed Weights   10/05/20 1406  Weight: 74.4 kg    Examination:  General exam: Appears calm and comfortable  Respiratory system: Clear to auscultation. Respiratory effort normal. Cardiovascular system: S1 & S2 heard, RRR. No JVD, murmurs, rubs, gallops or clicks. No pedal edema. Gastrointestinal system: Abdomen is nondistended, soft and nontender. No organomegaly or masses felt. Normal bowel sounds heard. Central nervous system: Alert and oriented. No focal neurological deficits. Extremities: Right leg: Shortened and externally rotated.  No ecchymosis or bruising noted.  Pulses intact.  Tenderness noted on right upper & lateral side of the leg. Skin: No rashes, lesions or ulcers Psychiatry: Judgement and insight appear normal. Mood & affect appropriate.    Data Reviewed: I have personally reviewed following labs and imaging studies  CBC: Recent Labs  Lab 10/05/20 1405 10/06/20 0414  WBC 9.9 10.0  NEUTROABS 6.2  --   HGB 12.0  10.0*  HCT 36.3 30.6*  MCV 92.4 93.9  PLT 199 186   Basic Metabolic Panel: Recent Labs  Lab 10/05/20 1405 10/06/20 0414  NA 136 136  K 3.7 4.7  CL 102 105  CO2 24 27  GLUCOSE 116* 135*  BUN 17 18  CREATININE 0.93 0.84  CALCIUM 8.9 8.7*   GFR: Estimated Creatinine Clearance: 56.3 mL/min (by C-G formula based on SCr of 0.84 mg/dL). Liver Function Tests: No results for input(s): AST, ALT, ALKPHOS, BILITOT, PROT,  ALBUMIN in the last 168 hours. No results for input(s): LIPASE, AMYLASE in the last 168 hours. No results for input(s): AMMONIA in the last 168 hours. Coagulation Profile: Recent Labs  Lab 10/05/20 1405  INR 1.0   Cardiac Enzymes: No results for input(s): CKTOTAL, CKMB, CKMBINDEX, TROPONINI in the last 168 hours. BNP (last 3 results) No results for input(s): PROBNP in the last 8760 hours. HbA1C: No results for input(s): HGBA1C in the last 72 hours. CBG: No results for input(s): GLUCAP in the last 168 hours. Lipid Profile: No results for input(s): CHOL, HDL, LDLCALC, TRIG, CHOLHDL, LDLDIRECT in the last 72 hours. Thyroid Function Tests: No results for input(s): TSH, T4TOTAL, FREET4, T3FREE, THYROIDAB in the last 72 hours. Anemia Panel: No results for input(s): VITAMINB12, FOLATE, FERRITIN, TIBC, IRON, RETICCTPCT in the last 72 hours. Sepsis Labs: No results for input(s): PROCALCITON, LATICACIDVEN in the last 168 hours.  Recent Results (from the past 240 hour(s))  Respiratory Panel by RT PCR (Flu A&B, Covid) - Nasopharyngeal Swab     Status: None   Collection Time: 10/05/20  2:56 PM   Specimen: Nasopharyngeal Swab  Result Value Ref Range Status   SARS Coronavirus 2 by RT PCR NEGATIVE NEGATIVE Final    Comment: (NOTE) SARS-CoV-2 target nucleic acids are NOT DETECTED.  The SARS-CoV-2 RNA is generally detectable in upper respiratoy specimens during the acute phase of infection. The lowest concentration of SARS-CoV-2 viral copies this assay can detect is 131 copies/mL. A negative result does not preclude SARS-Cov-2 infection and should not be used as the sole basis for treatment or other patient management decisions. A negative result may occur with  improper specimen collection/handling, submission of specimen other than nasopharyngeal swab, presence of viral mutation(s) within the areas targeted by this assay, and inadequate number of viral copies (<131 copies/mL). A negative  result must be combined with clinical observations, patient history, and epidemiological information. The expected result is Negative.  Fact Sheet for Patients:  https://www.moore.com/  Fact Sheet for Healthcare Providers:  https://www.young.biz/  This test is no t yet approved or cleared by the [place] FDA and  has been authorized for detection and/or diagnosis of SARS-CoV-2 by FDA under an Emergency Use Authorization (EUA). This EUA will remain  in effect (meaning this test can be used) for the duration of the COVID-19 declaration under Section 564(b)(1) of the Act, 21 U.S.C. section 360bbb-3(b)(1), unless the authorization is terminated or revoked sooner.     Influenza A by PCR NEGATIVE NEGATIVE Final   Influenza B by PCR NEGATIVE NEGATIVE Final    Comment: (NOTE) The Xpert Xpress SARS-CoV-2/FLU/RSV assay is intended as an aid in  the diagnosis of influenza from Nasopharyngeal swab specimens and  should not be used as a sole basis for treatment. Nasal washings and  aspirates are unacceptable for Xpert Xpress SARS-CoV-2/FLU/RSV  testing.  Fact Sheet for Patients: https://www.moore.com/  Fact Sheet for Healthcare Providers: https://www.young.biz/  This test is not yet approved or  cleared by the [place] and  has been authorized for detection and/or diagnosis of SARS-CoV-2 by  FDA under an Emergency Use Authorization (EUA). This EUA will remain  in effect (meaning this test can be used) for the duration of the  Covid-19 declaration under Section 564(b)(1) of the Act, 21  U.S.C. section 360bbb-3(b)(1), unless the authorization is  terminated or revoked. Performed at [facility], [address] [place], [place] [address]   Surgical pcr screen     Status: Abnormal   Collection Time: 10/06/20  9:50 AM   Specimen: Nasal Mucosa; Nasal Swab  Result Value Ref Range Status    MRSA, PCR NEGATIVE NEGATIVE Final   Staphylococcus aureus POSITIVE (A) NEGATIVE Final    Comment: (NOTE) The Xpert SA Assay (FDA approved for NASAL specimens in patients 12 years of age and older), is one component of a comprehensive surveillance program. It is not intended to diagnose infection nor to guide or monitor treatment. Performed at [facility], [address] [place], [place] [address]       Radiology Studies: DG Chest 1 View  Result Date: 10/05/2020 CLINICAL DATA:  Post fall. EXAM: CHEST  1 VIEW COMPARISON:  February 18, 2016 FINDINGS: Apparent contour irregularity of the right heart border and right mediastinum. No evidence of cardiomegaly. There is no evidence of focal airspace consolidation, pleural effusion or pneumothorax. Osseous structures are without acute abnormality. Soft tissues are grossly normal. IMPRESSION: Apparent contour irregularity of the right heart border and right mediastinum. This may be projectional however it may represent a mediastinal mass or lymphadenopathy. Further evaluation with CT of the chest with contrast, with clinically feasible, is recommended. Electronically Signed   By: Ted Mcalpine M.D.   On: 10/05/2020 14:51   CT CHEST W CONTRAST  Result Date: 10/05/2020 CLINICAL DATA:  Abnormal chest x-ray. EXAM: CT CHEST WITH CONTRAST TECHNIQUE: Multidetector CT imaging of the chest was performed during intravenous contrast administration. CONTRAST:  87mL OMNIPAQUE IOHEXOL 300 MG/ML  SOLN COMPARISON:  Same day chest radiograph. FINDINGS: Cardiovascular: Scattered atherosclerotic calcifications throughout the aorta. Predominately LEFT-sided coronary artery atherosclerotic calcifications. Asymmetric enlargement of the main pulmonary artery in relation to the ascending thoracic aorta. No pericardial effusion. Heart is at the upper limits of normal in size. Mediastinum/Nodes: No enlarged mediastinal, hilar, or axillary lymph nodes. Thyroid  gland, trachea, and esophagus demonstrate no significant findings. Lungs/Pleura: Bibasilar dependent consolidative opacities most consistent with atelectasis. No pleural effusion or pneumothorax. LEFT lower lobe pulmonary nodule measures 6 mm (series 2, image 61). Upper Abdomen: No acute abnormality.  Cholelithiasis. Musculoskeletal: Remote rib fractures. Age-indeterminate rib fracture of the RIGHT lateral third and fourth rib. Mild degenerative changes of the thoracic spine. 1 IMPRESSION: 1. Age-indeterminate rib fracture of the RIGHT lateral third and fourth rib. Correlate for point tenderness. 2. Asymmetric enlargement of the main pulmonary artery in relation to the ascending thoracic aorta, which can be seen with pulmonary arterial hypertension. This may account for the contour abnormality on chest radiograph. 3. 6 mm LEFT lower lobe pulmonary nodule. Non-contrast chest CT at 6-12 months is recommended. If the nodule is stable at time of repeat CT, then future CT at 18-24 months (from today's scan) is considered optional for low-risk patients, but is recommended for high-risk patients. This recommendation follows the consensus statement: Guidelines for Management of Incidental Pulmonary Nodules Detected on CT Images: From the Fleischner Society 2017; Radiology 2017; 284:228-243. Aortic Atherosclerosis (ICD10-I70.0). Electronically Signed   By: Judeth Cornfield  Peacock MD   On: 10/05/2020 17:34   DG Hip Unilat W or Wo Pelvis 2-3 Views Right  Result Date: 10/05/2020 CLINICAL DATA:  Post fall with rotation and shortening of the right leg. EXAM: DG HIP (WITH OR WITHOUT PELVIS) 2-3V RIGHT COMPARISON:  None. FINDINGS: There is a comminuted impacted fracture of the intratrochanteric portion of the right femur with butterfly fragment consisting of the lesser trochanter. Superior displacement of the distal fracture fragment. The right femoral head is normally located within the acetabulum. Mild osteoarthritic changes of  the left hip. IMPRESSION: Comminuted impacted fracture of the intratrochanteric portion of the right femur. Electronically Signed   By: Ted Mcalpineobrinka  Dimitrova M.D.   On: 10/05/2020 14:49    Scheduled Meds: . [MAR Hold] amLODipine  5 mg Oral Daily  . [MAR Hold] atorvastatin  10 mg Oral QHS  . [MAR Hold] cholecalciferol  1,000 Units Oral Daily  . [MAR Hold] donepezil  5 mg Oral QHS  . [MAR Hold] loratadine  10 mg Oral Daily  . [MAR Hold] memantine  5 mg Oral BID  . [MAR Hold] multivitamin with minerals  1 tablet Oral Daily   Continuous Infusions: . [MAR Hold] methocarbamol (ROBAXIN) IV       LOS: 1 day   Time spent: 35  minutes  Maelle Sheaffer Estill Cotta Rosemarie Galvis, MD [facility]  If 7PM-7AM, please contact night-coverage [email or web address] 10/06/2020, 1:29 PM

## 2020-10-06 NOTE — Op Note (Signed)
10/06/2020  1:50 PM  PATIENT:  Cheyenne Parker    PRE-OPERATIVE DIAGNOSIS:  Right displaced, comminuted intertrochanteric hip Fracture  POST-OPERATIVE DIAGNOSIS:  Same  PROCEDURE: Intramedullary fixation of right comminuted intertrochanteric hip fracture  SURGEON:  Juanell Fairly, MD  ANESTHESIA:   Spinal  EBL:  50cc  IMPLANT:  ZIMMER BIOMET AFFIXUS NAIL 11 mm x 360 mm with a 105 mm lag screw and distal interlocking screw 44 mm in length.  PREOPERATIVE INDICATIONS:  Cheyenne Parker is a  74 y.o. female with a diagnosis of Right comminuted intertrochanteric hip Fracture.  I have recommended intramedullary fixation for this fracture.  The risks, benefits and alternatives were discussed with the patient and their family.  The risks include but are not limited to infection, bleeding requiring blood transfusion, nerve or blood vessel injury, malunion, nonunion, hardware prominence, hardware failure, leg length discrepancy or change in lower extremity rotation and need for further surgery including hardware removal with conversion to a total hip arthroplasty. Medical risks include but are not limited to DVT and pulmonary embolism, myocardial infarction, stroke, pneumonia, respiratory failure and death. The patient and their husband understood these risks and wished to proceed with surgery.  OPERATIVE PROCEDURE:  The patient was brought to the operating room and placed in the supine position on the fracture table. The patient received spinal anesthesia.  A closed reduction was performed under C-arm guidance.  The fracture reduction was confirmed on both AP and lateral views. After adequate reduction was achieved, a time out was performed to verify the patient's name, date of birth, medical record number, correct site of surgery correct procedure to be performed. The timeout was also used to verify the patient received antibiotics and all appropriate instruments, implants and radiographic studies were  available in the room. Once all in attendance were in agreement, the case began. The patient was prepped and draped in a sterile fashion.  She received preoperative antibiotics with 2 g of Ancef IV.  An incision was made proximal to the greater trochanter in line with the femur. A guidewire was placed over the tip of the greater trochanter and advanced by drill into the proximal femur to the level of the lesser trochanter.  Confirmation of the drill pin position was made on AP and lateral C-arm images.  The threaded guidepin was then overdrilled with the proximal femoral entry reamer.  A ball-tipped guidewire was then advanced down the intramedullary canal, across the fracture, and down the femoral shaft to the knee.  The ball tip guidewire's position was confirmed at both the knee and hip via C-arm imaging. A depth gauge was used to measure the length of the long nail to be used. It was measured to be 360 mm.  Sequential reamers were then placed down the femoral canal up to 13 mm allowing for placement of 11 mm diameter nail.  The actual 11x360 mm nail was then inserted into the proximal femur, across the fracture site and down the femoral shaft. Its position was confirmed on AP and lateral C-arm images.  The ball tip guidewire was removed.  Once the nail was completely seated, the drill guide for the lag screw was placed through the guide arm for the Affixus nail. A guidepin was then placed through this drill guide and advanced through the lateral cortex of the femur, across the fracture site and into the femoral head achieving a tip apex distance of less than 25 mm. The length of the drill pin was  measured to be 105 mm, and then the drill for the lag screw was advanced through the lateral cortex, across the fracture site and up into the femoral head to the depth of the lag screw.  The 105 mm lag screw was then advanced by hand into position across the fracture site into the femoral head. Its final position  was confirmed on AP and lateral C-arm images. Compression was applied as traction was carefully released. The set screw in the top of the intramedullary rod was tightened by hand using a screwdriver. It was backed off a quarter turn to allow for compression at the fracture site.  The attention was then turned to placement of the distal interlocking screws. A perfect circle technique was used.  A single small stab incision was made over the distal interlocking screw holes.  A free hand technique was used to drill both distal interlocking screws. The depth of the screw holes was measured with a depth gauge to be 44 mm. The 22mm lag screw was then advanced into position and tightened by hand. Final C-arm images of the entire intramedullary construct were taken in both the AP and lateral planes.   The wounds were irrigated copiously and closed with 0 Vicryl for closure of the deep fascia and 2-0 Vicryl for subcutaneous closure. The skin was approximated with staples. A dry sterile dressing was applied. I was scrubbed and present the entire case and all sharp, sponge and instrument counts were correct at the conclusion of the case. Patient was transferred to a hospital bed and brought to PACU in stable condition.     Cheyenne Devoid, MD

## 2020-10-06 NOTE — Anesthesia Preprocedure Evaluation (Addendum)
Anesthesia Evaluation  Patient identified by MRN, date of birth, ID band Patient awake and Patient confused    Reviewed: Allergy & Precautions, H&P , NPO status , Patient's Chart, lab work & pertinent test results  History of Anesthesia Complications Negative for: history of anesthetic complications  Airway Mallampati: III  TM Distance: >3 FB Neck ROM: full    Dental  (+) Chipped, Poor Dentition, Missing, Upper Dentures   Pulmonary neg pulmonary ROS, neg shortness of breath,    Pulmonary exam normal        Cardiovascular Exercise Tolerance: Good hypertension, Normal cardiovascular exam     Neuro/Psych PSYCHIATRIC DISORDERS Dementia negative neurological ROS     GI/Hepatic negative GI ROS, Neg liver ROS,   Endo/Other  negative endocrine ROS  Renal/GU      Musculoskeletal   Abdominal   Peds  Hematology negative hematology ROS (+)   Anesthesia Other Findings Past Medical History: No date: Anemia No date: Hyperlipidemia No date: IFG (impaired fasting glucose) No date: Obesity No date: Osteoporosis No date: Vitamin D deficiency disease  Past Surgical History: No date: ABDOMINAL HYSTERECTOMY     Comment:  complete No date: FRACTURE SURGERY  BMI    Body Mass Index: 30.00 kg/m      Reproductive/Obstetrics negative OB ROS                          Anesthesia Physical Anesthesia Plan  ASA: III  Anesthesia Plan: Spinal   Post-op Pain Management:    Induction:   PONV Risk Score and Plan:   Airway Management Planned: Natural Airway and Nasal Cannula  Additional Equipment:   Intra-op Plan:   Post-operative Plan:   Informed Consent: I have reviewed the patients History and Physical, chart, labs and discussed the procedure including the risks, benefits and alternatives for the proposed anesthesia with the patient or authorized representative who has indicated his/her  understanding and acceptance.     Dental Advisory Given  Plan Discussed with: Anesthesiologist, CRNA and Surgeon  Anesthesia Plan Comments: (History and phone consent from the patients husband Shatana Saxton at [phone] and the patient herself  Patient and husband report no active bleeding problems and no anticoagulant use.  They report a history of nose bleeds as a child that she feels that she grew out of.  No other history of easy bleeding or trouble clotting.  No bleeding issues with dental extractions.  They report that she that she no longer has any bleeding issues. Normal PT, INR and platelets on admission.    Plan for spinal with backup GA  They were consented for risks of anesthesia including but not limited to:  - adverse reactions to medications - damage to eyes, teeth, lips or other oral mucosa - nerve damage due to positioning  - risk of bleeding, infection and or nerve damage from spinal that could lead to paralysis - risk of headache or failed spinal - damage to teeth, lips or other oral mucosa - sore throat or hoarseness - damage to heart, brain, nerves, lungs, other parts of body or loss of life  They voiced understanding.)     Anesthesia Quick Evaluation

## 2020-10-06 NOTE — Transfer of Care (Signed)
Immediate Anesthesia Transfer of Care Note  Patient: Cheyenne Parker  Procedure(s) Performed: INTRAMEDULLARY (IM) NAIL INTERTROCHANTRIC (Right Leg Upper)  Patient Location: PACU  Anesthesia Type:Spinal  Level of Consciousness: awake, alert  and oriented  Airway & Oxygen Therapy: Patient Spontanous Breathing  Post-op Assessment: Report given to RN and Post -op Vital signs reviewed and stable  Post vital signs: Reviewed and stable  Last Vitals:  Vitals Value Taken Time  BP    Temp    Pulse    Resp    SpO2      Last Pain:  Vitals:   10/06/20 1100  TempSrc:   PainSc: 10-Worst pain ever      Patients Stated Pain Goal: 0 (10/06/20 0627)  Complications: No complications documented.

## 2020-10-06 NOTE — TOC Initial Note (Signed)
Transition of Care [facility]) - Initial/Assessment Note    Patient Details  Name: Cheyenne Parker MRN: [id number] Date of Birth: 12-21-1945  [facility]) CM/SW Contact:    Norvel Richards, RN Phone Number: 10/06/2020, 5:31 PM  Clinical Narrative: [facility] Team for discharge planning. Pt to have right hip surgery. Plan for discharge is to SNF. Will continue to monitor progress.                        Patient Goals and CMS Choice        Expected Discharge Plan and Services                                                Prior Living Arrangements/Services                       Activities of Daily Living      Permission Sought/Granted                  Emotional Assessment              Admission diagnosis:  Closed displaced intertrochanteric fracture of right femur, initial encounter (HCC) [S72.141A] Intertrochanteric fracture of right femur, closed, initial encounter [facility]) [S72.141A] Patient Active Problem List   Diagnosis Date Noted   Intertrochanteric fracture of right femur, closed, initial encounter (HCC) 10/05/2020   Dementia without behavioral disturbance (HCC) 10/05/2020   Need for COVID-19 vaccine 09/10/2020   Memory change 07/16/2020   Essential hypertension 04/15/2020   Low HDL (under 40) 04/28/2017   Annual physical exam 09/21/2016   Constipation 09/20/2015   Elevated blood pressure, situational 09/20/2015   Osteoporosis    IFG (impaired fasting glucose)    Mixed hyperlipidemia    Obesity    Hemorrhoid 07/20/2012   Insomnia 07/20/2012   PCP:  Corky Downs, MD Pharmacy:   [facility] [address] ([address]), [place] - [address] [place]) [place] [address] Phone: [phone] Fax: [phone]     Social Determinants of Health (SDOH) Interventions    Readmission Risk Interventions No flowsheet data found.

## 2020-10-06 NOTE — Progress Notes (Signed)
Subjective:  Saw the patient in the preop area today.  Her pain is better controlled.  Objective:   VITALS:   Vitals:   10/05/20 2142 10/06/20 0000 10/06/20 0423 10/06/20 0752  BP: 122/67 128/63 129/64 128/67  Pulse: 86 86 85 80  Resp: 16 18 16 18   Temp: 97.9 F (36.6 C) 97.8 F (36.6 C) 97.8 F (36.6 C) 98.5 F (36.9 C)  TempSrc: Oral Oral Oral Oral  SpO2:    94%  Weight:      Height:        PHYSICAL EXAM: Right lower extremity: Right lower extremity is marked according the hospital's correct site of surgery protocol. Neurovascular intact Sensation intact distally Intact pulses distally Dorsiflexion/Plantar flexion intact No cellulitis present Compartment soft  LABS  Results for orders placed or performed during the hospital encounter of 10/05/20 (from the past 24 hour(s))  Basic metabolic panel     Status: Abnormal   Collection Time: 10/05/20  2:05 PM  Result Value Ref Range   Sodium 136 135 - 145 mmol/L   Potassium 3.7 3.5 - 5.1 mmol/L   Chloride 102 98 - 111 mmol/L   CO2 24 22 - 32 mmol/L   Glucose, Bld 116 (H) 70 - 99 mg/dL   BUN 17 8 - 23 mg/dL   Creatinine, Ser 10/07/20 0.44 - 1.00 mg/dL   Calcium 8.9 8.9 - 2.75 mg/dL   GFR, Estimated 17.0 >01 mL/min   Anion gap 10 5 - 15  CBC with Differential     Status: Abnormal   Collection Time: 10/05/20  2:05 PM  Result Value Ref Range   WBC 9.9 4.0 - 10.5 K/uL   RBC 3.93 3.87 - 5.11 MIL/uL   Hemoglobin 12.0 12.0 - 15.0 g/dL   HCT 10/07/20 36 - 46 %   MCV 92.4 80.0 - 100.0 fL   MCH 30.5 26.0 - 34.0 pg   MCHC 33.1 30.0 - 36.0 g/dL   RDW 94.4 96.7 - 59.1 %   Platelets 199 150 - 400 K/uL   nRBC 0.0 0.0 - 0.2 %   Neutrophils Relative % 62 %   Neutro Abs 6.2 1.7 - 7.7 K/uL   Lymphocytes Relative 29 %   Lymphs Abs 2.8 0.7 - 4.0 K/uL   Monocytes Relative 7 %   Monocytes Absolute 0.7 0.1 - 1.0 K/uL   Eosinophils Relative 1 %   Eosinophils Absolute 0.1 0.0 - 0.5 K/uL   Basophils Relative 0 %   Basophils Absolute 0.0  0.0 - 0.1 K/uL   Immature Granulocytes 1 %   Abs Immature Granulocytes 0.13 (H) 0.00 - 0.07 K/uL  Type and screen [facility]     Status: None   Collection Time: 10/05/20  2:05 PM  Result Value Ref Range   ABO/RH(D) A POS    Antibody Screen NEG    Sample Expiration      10/08/2020,2359 Performed at [facility], [address] [place], [place]   Protime-INR     Status: None   Collection Time: 10/05/20  2:05 PM  Result Value Ref Range   Prothrombin Time 13.0 11.4 - 15.2 seconds   INR 1.0 0.8 - 1.2  Respiratory Panel by RT PCR (Flu A&B, Covid) - Nasopharyngeal Swab     Status: None   Collection Time: 10/05/20  2:56 PM   Specimen: Nasopharyngeal Swab  Result Value Ref Range   SARS Coronavirus 2 by RT PCR NEGATIVE NEGATIVE  Influenza A by PCR NEGATIVE NEGATIVE   Influenza B by PCR NEGATIVE NEGATIVE  CBC     Status: Abnormal   Collection Time: 10/06/20  4:14 AM  Result Value Ref Range   WBC 10.0 4.0 - 10.5 K/uL   RBC 3.26 (L) 3.87 - 5.11 MIL/uL   Hemoglobin 10.0 (L) 12.0 - 15.0 g/dL   HCT 79.3 (L) 36 - 46 %   MCV 93.9 80.0 - 100.0 fL   MCH 30.7 26.0 - 34.0 pg   MCHC 32.7 30.0 - 36.0 g/dL   RDW 90.3 00.9 - 23.3 %   Platelets 186 150 - 400 K/uL   nRBC 0.0 0.0 - 0.2 %  Basic metabolic panel     Status: Abnormal   Collection Time: 10/06/20  4:14 AM  Result Value Ref Range   Sodium 136 135 - 145 mmol/L   Potassium 4.7 3.5 - 5.1 mmol/L   Chloride 105 98 - 111 mmol/L   CO2 27 22 - 32 mmol/L   Glucose, Bld 135 (H) 70 - 99 mg/dL   BUN 18 8 - 23 mg/dL   Creatinine, Ser 0.07 0.44 - 1.00 mg/dL   Calcium 8.7 (L) 8.9 - 10.3 mg/dL   GFR, Estimated >62 >26 mL/min   Anion gap 4 (L) 5 - 15    DG Chest 1 View  Result Date: 10/05/2020 CLINICAL DATA:  Post fall. EXAM: CHEST  1 VIEW COMPARISON:  February 18, 2016 FINDINGS: Apparent contour irregularity of the right heart border and right mediastinum. No evidence of cardiomegaly. There is no evidence  of focal airspace consolidation, pleural effusion or pneumothorax. Osseous structures are without acute abnormality. Soft tissues are grossly normal. IMPRESSION: Apparent contour irregularity of the right heart border and right mediastinum. This may be projectional however it may represent a mediastinal mass or lymphadenopathy. Further evaluation with CT of the chest with contrast, with clinically feasible, is recommended. Electronically Signed   By: Ted Mcalpine M.D.   On: 10/05/2020 14:51   CT CHEST W CONTRAST  Result Date: 10/05/2020 CLINICAL DATA:  Abnormal chest x-ray. EXAM: CT CHEST WITH CONTRAST TECHNIQUE: Multidetector CT imaging of the chest was performed during intravenous contrast administration. CONTRAST:  32mL OMNIPAQUE IOHEXOL 300 MG/ML  SOLN COMPARISON:  Same day chest radiograph. FINDINGS: Cardiovascular: Scattered atherosclerotic calcifications throughout the aorta. Predominately LEFT-sided coronary artery atherosclerotic calcifications. Asymmetric enlargement of the main pulmonary artery in relation to the ascending thoracic aorta. No pericardial effusion. Heart is at the upper limits of normal in size. Mediastinum/Nodes: No enlarged mediastinal, hilar, or axillary lymph nodes. Thyroid gland, trachea, and esophagus demonstrate no significant findings. Lungs/Pleura: Bibasilar dependent consolidative opacities most consistent with atelectasis. No pleural effusion or pneumothorax. LEFT lower lobe pulmonary nodule measures 6 mm (series 2, image 61). Upper Abdomen: No acute abnormality.  Cholelithiasis. Musculoskeletal: Remote rib fractures. Age-indeterminate rib fracture of the RIGHT lateral third and fourth rib. Mild degenerative changes of the thoracic spine. 1 IMPRESSION: 1. Age-indeterminate rib fracture of the RIGHT lateral third and fourth rib. Correlate for point tenderness. 2. Asymmetric enlargement of the main pulmonary artery in relation to the ascending thoracic aorta, which can  be seen with pulmonary arterial hypertension. This may account for the contour abnormality on chest radiograph. 3. 6 mm LEFT lower lobe pulmonary nodule. Non-contrast chest CT at 6-12 months is recommended. If the nodule is stable at time of repeat CT, then future CT at 18-24 months (from today's scan) is considered optional for low-risk patients,  but is recommended for high-risk patients. This recommendation follows the consensus statement: Guidelines for Management of Incidental Pulmonary Nodules Detected on CT Images: From the Fleischner Society 2017; Radiology 2017; 284:228-243. Aortic Atherosclerosis (ICD10-I70.0). Electronically Signed   By: Meda Klinefelter MD   On: 10/05/2020 17:34   DG Hip Unilat W or Wo Pelvis 2-3 Views Right  Result Date: 10/05/2020 CLINICAL DATA:  Post fall with rotation and shortening of the right leg. EXAM: DG HIP (WITH OR WITHOUT PELVIS) 2-3V RIGHT COMPARISON:  None. FINDINGS: There is a comminuted impacted fracture of the intratrochanteric portion of the right femur with butterfly fragment consisting of the lesser trochanter. Superior displacement of the distal fracture fragment. The right femoral head is normally located within the acetabulum. Mild osteoarthritic changes of the left hip. IMPRESSION: Comminuted impacted fracture of the intratrochanteric portion of the right femur. Electronically Signed   By: Ted Mcalpine M.D.   On: 10/05/2020 14:49    Assessment/Plan: Day of Surgery   Principal Problem:   Intertrochanteric fracture of right femur, closed, initial encounter [facility]) Active Problems:   Obesity   Essential hypertension   Dementia without behavioral disturbance [facility])  Patient has been cleared by the hospital service for surgery.  She has been n.p.o. after midnight.  Patient is going to have intramedullary fixation for right intertrochanteric hip fracture today.   Cheyenne Parker , MD 10/06/2020, 11:33 AM

## 2020-10-06 NOTE — Anesthesia Procedure Notes (Signed)
Spinal  Patient location during procedure: OR Start time: 10/06/2020 11:49 AM End time: 10/06/2020 11:51 AM Staffing Performed: anesthesiologist  Anesthesiologist: Douglas Smolinsky, Precious Haws, MD Preanesthetic Checklist Completed: patient identified, IV checked, site marked, risks and benefits discussed, surgical consent, monitors and equipment checked, pre-op evaluation and timeout performed Spinal Block Patient position: sitting Prep: ChloraPrep Patient monitoring: heart rate, continuous pulse ox, blood pressure and cardiac monitor Approach: midline Location: L3-4 Injection technique: single-shot Needle Needle type: Whitacre and Introducer  Needle gauge: 24 G Needle length: 9 cm Assessment Sensory level: T10 Additional Notes Negative paresthesia. Negative blood return. Positive free-flowing CSF. Expiration date of kit checked and confirmed. Patient tolerated procedure well, without complications.

## 2020-10-06 NOTE — Progress Notes (Signed)
Subjective:  POST OP CHECK:  s/p intramedullary fixation for right intertrochanteric hip fracture.   Patient reports right hip pain as mild to moderate.  Patient has had return of sensation and motor function to her lower extremities following her spinal block.  Objective:   VITALS:   Vitals:   10/06/20 1544 10/06/20 1636 10/06/20 1639 10/06/20 1747  BP: (!) 142/62 (!) 131/54  (!) 133/53  Pulse: 78 82  87  Resp: 16 17  17   Temp: 97.6 F (36.4 C) 98 F (36.7 C)  98.6 F (37 C)  TempSrc: Oral Oral  Oral  SpO2: 99% (!) 89% 95% 93%  Weight:      Height:        PHYSICAL EXAM: Right lower extremity Neurovascular intact Sensation intact distally Intact pulses distally Dorsiflexion/Plantar flexion intact Incision: dressing C/D/I No cellulitis present Compartment soft  LABS  Results for orders placed or performed during the hospital encounter of 10/05/20 (from the past 24 hour(s))  CBC     Status: Abnormal   Collection Time: 10/06/20  4:14 AM  Result Value Ref Range   WBC 10.0 4.0 - 10.5 K/uL   RBC 3.26 (L) 3.87 - 5.11 MIL/uL   Hemoglobin 10.0 (L) 12.0 - 15.0 g/dL   HCT 10/08/20 (L) 36 - 46 %   MCV 93.9 80.0 - 100.0 fL   MCH 30.7 26.0 - 34.0 pg   MCHC 32.7 30.0 - 36.0 g/dL   RDW 16.1 09.6 - 04.5 %   Platelets 186 150 - 400 K/uL   nRBC 0.0 0.0 - 0.2 %  Basic metabolic panel     Status: Abnormal   Collection Time: 10/06/20  4:14 AM  Result Value Ref Range   Sodium 136 135 - 145 mmol/L   Potassium 4.7 3.5 - 5.1 mmol/L   Chloride 105 98 - 111 mmol/L   CO2 27 22 - 32 mmol/L   Glucose, Bld 135 (H) 70 - 99 mg/dL   BUN 18 8 - 23 mg/dL   Creatinine, Ser 10/08/20 0.44 - 1.00 mg/dL   Calcium 8.7 (L) 8.9 - 10.3 mg/dL   GFR, Estimated 8.11 >91 mL/min   Anion gap 4 (L) 5 - 15  Surgical pcr screen     Status: Abnormal   Collection Time: 10/06/20  9:50 AM   Specimen: Nasal Mucosa; Nasal Swab  Result Value Ref Range   MRSA, PCR NEGATIVE NEGATIVE   Staphylococcus aureus POSITIVE (A)  NEGATIVE    DG Chest 1 View  Result Date: 10/05/2020 CLINICAL DATA:  Post fall. EXAM: CHEST  1 VIEW COMPARISON:  February 18, 2016 FINDINGS: Apparent contour irregularity of the right heart border and right mediastinum. No evidence of cardiomegaly. There is no evidence of focal airspace consolidation, pleural effusion or pneumothorax. Osseous structures are without acute abnormality. Soft tissues are grossly normal. IMPRESSION: Apparent contour irregularity of the right heart border and right mediastinum. This may be projectional however it may represent a mediastinal mass or lymphadenopathy. Further evaluation with CT of the chest with contrast, with clinically feasible, is recommended. Electronically Signed   By: February 20, 2016 M.D.   On: 10/05/2020 14:51   CT CHEST W CONTRAST  Result Date: 10/05/2020 CLINICAL DATA:  Abnormal chest x-ray. EXAM: CT CHEST WITH CONTRAST TECHNIQUE: Multidetector CT imaging of the chest was performed during intravenous contrast administration. CONTRAST:  48mL OMNIPAQUE IOHEXOL 300 MG/ML  SOLN COMPARISON:  Same day chest radiograph. FINDINGS: Cardiovascular: Scattered atherosclerotic calcifications throughout the aorta. Predominately  LEFT-sided coronary artery atherosclerotic calcifications. Asymmetric enlargement of the main pulmonary artery in relation to the ascending thoracic aorta. No pericardial effusion. Heart is at the upper limits of normal in size. Mediastinum/Nodes: No enlarged mediastinal, hilar, or axillary lymph nodes. Thyroid gland, trachea, and esophagus demonstrate no significant findings. Lungs/Pleura: Bibasilar dependent consolidative opacities most consistent with atelectasis. No pleural effusion or pneumothorax. LEFT lower lobe pulmonary nodule measures 6 mm (series 2, image 61). Upper Abdomen: No acute abnormality.  Cholelithiasis. Musculoskeletal: Remote rib fractures. Age-indeterminate rib fracture of the RIGHT lateral third and fourth rib. Mild  degenerative changes of the thoracic spine. 1 IMPRESSION: 1. Age-indeterminate rib fracture of the RIGHT lateral third and fourth rib. Correlate for point tenderness. 2. Asymmetric enlargement of the main pulmonary artery in relation to the ascending thoracic aorta, which can be seen with pulmonary arterial hypertension. This may account for the contour abnormality on chest radiograph. 3. 6 mm LEFT lower lobe pulmonary nodule. Non-contrast chest CT at 6-12 months is recommended. If the nodule is stable at time of repeat CT, then future CT at 18-24 months (from today's scan) is considered optional for low-risk patients, but is recommended for high-risk patients. This recommendation follows the consensus statement: Guidelines for Management of Incidental Pulmonary Nodules Detected on CT Images: From the Fleischner Society 2017; Radiology 2017; 284:228-243. Aortic Atherosclerosis (ICD10-I70.0). Electronically Signed   By: Meda Klinefelter MD   On: 10/05/2020 17:34   DG C-Arm 1-60 Min  Result Date: 10/06/2020 CLINICAL DATA:  Right femur fracture, ORIF EXAM: DG C-ARM 1-60 MIN; RIGHT FEMUR 2 VIEWS COMPARISON:  COMPARISON Hip radiograph 10/05/2020 FINDINGS: Fluoroscopic spot images demonstrate femoral IM nail and hip screw fixation of a intertrochanteric fracture. Separate lesser trochanteric fragment. Single interlocking screw distally in the IM nail. No complicating feature. IMPRESSION: 1. ORIF of a right intertrochanteric fracture. Electronically Signed   By: Gaylyn Rong M.D.   On: 10/06/2020 15:21   DG Hip Unilat W or Wo Pelvis 2-3 Views Right  Result Date: 10/05/2020 CLINICAL DATA:  Post fall with rotation and shortening of the right leg. EXAM: DG HIP (WITH OR WITHOUT PELVIS) 2-3V RIGHT COMPARISON:  None. FINDINGS: There is a comminuted impacted fracture of the intratrochanteric portion of the right femur with butterfly fragment consisting of the lesser trochanter. Superior displacement of the  distal fracture fragment. The right femoral head is normally located within the acetabulum. Mild osteoarthritic changes of the left hip. IMPRESSION: Comminuted impacted fracture of the intratrochanteric portion of the right femur. Electronically Signed   By: Ted Mcalpine M.D.   On: 10/05/2020 14:49   DG FEMUR, MIN 2 VIEWS RIGHT  Result Date: 10/06/2020 CLINICAL DATA:  Right femur fracture, ORIF EXAM: DG C-ARM 1-60 MIN; RIGHT FEMUR 2 VIEWS COMPARISON:  COMPARISON Hip radiograph 10/05/2020 FINDINGS: Fluoroscopic spot images demonstrate femoral IM nail and hip screw fixation of a intertrochanteric fracture. Separate lesser trochanteric fragment. Single interlocking screw distally in the IM nail. No complicating feature. IMPRESSION: 1. ORIF of a right intertrochanteric fracture. Electronically Signed   By: Gaylyn Rong M.D.   On: 10/06/2020 15:21   DG FEMUR, MIN 2 VIEWS RIGHT  Result Date: 10/06/2020 CLINICAL DATA:  Right femur IM nail EXAM: RIGHT FEMUR 2 VIEWS COMPARISON:  Hip radiograph 10/05/2020 FINDINGS: Right femoral IM nail with hip screw noted, the intramedullary nail is long stem extending to the distal metadiaphysis, with a single distal interlocking screw. Near anatomic alignment of the intertrochanteric fracture aside from the mildly displaced  lesser trochanteric fragment. IMPRESSION: 1. Right femoral IM nail placement for ORIF of intertrochanteric fracture. Near anatomic alignment aside from the mildly displaced lesser trochanteric fragment. Electronically Signed   By: Gaylyn Rong M.D.   On: 10/06/2020 15:19    Assessment/Plan: Day of Surgery   Principal Problem:   Intertrochanteric fracture of right femur, closed, initial encounter [facility]) Active Problems:   Obesity   Essential hypertension   Dementia without behavioral disturbance (HCC)  Patient doing well postop.  I have reviewed the postoperative x-ray which demonstrates near anatomic alignment of her fracture.   There is no evidence of postop complications.  Patient pain is controlled currently.  Her spinal block is worn off and she is able to dorsiflex and plantarflex her ankle and flex and extend her toes.  She has intact sensation light touch throughout the right lower extremity.  Patient will have her Foley catheter removed tomorrow.  She will begin physical therapy tomorrow.  She will complete 24 hours of postop antibiotics.    Juanell Fairly , MD 10/06/2020, 6:10 PM

## 2020-10-07 DIAGNOSIS — S72141A Displaced intertrochanteric fracture of right femur, initial encounter for closed fracture: Secondary | ICD-10-CM | POA: Diagnosis not present

## 2020-10-07 LAB — BASIC METABOLIC PANEL
Anion gap: 9 (ref 5–15)
BUN: 19 mg/dL (ref 8–23)
CO2: 25 mmol/L (ref 22–32)
Calcium: 8.4 mg/dL — ABNORMAL LOW (ref 8.9–10.3)
Chloride: 105 mmol/L (ref 98–111)
Creatinine, Ser: 0.76 mg/dL (ref 0.44–1.00)
GFR, Estimated: >60 mL/min (ref >60)
Glucose, Bld: 127 mg/dL — ABNORMAL HIGH (ref 70–99)
Potassium: 3.7 mmol/L (ref 3.5–5.1)
Sodium: 139 mmol/L (ref 135–145)

## 2020-10-07 LAB — CBC
HCT: 25.1 % — ABNORMAL LOW (ref 36.0–46.0)
Hemoglobin: 8.6 g/dL — ABNORMAL LOW (ref 12.0–15.0)
MCH: 32.2 pg (ref 26.0–34.0)
MCHC: 34.3 g/dL (ref 30.0–36.0)
MCV: 94 fL (ref 80.0–100.0)
Platelets: 141 10*3/uL — ABNORMAL LOW (ref 150–400)
RBC: 2.67 MIL/uL — ABNORMAL LOW (ref 3.87–5.11)
RDW: 13.1 % (ref 11.5–15.5)
WBC: 9.1 10*3/uL (ref 4.0–10.5)
nRBC: 0 % (ref 0.0–0.2)

## 2020-10-07 MED ORDER — ENSURE ENLIVE PO LIQD
237.0000 mL | Freq: Two times a day (BID) | ORAL | Status: DC
  Administered 2020-10-07 – 2020-10-11 (×9): 237 mL via ORAL

## 2020-10-07 NOTE — NC FL2 (Signed)
[place] MEDICAID FL2 LEVEL OF CARE SCREENING TOOL     IDENTIFICATION  Patient Name: Cheyenne Parker Birthdate: Oct 04, 1946 Sex: female Admission Date (Current Location): 10/05/2020  [place] and [place] Number:  Chiropodist and Address:  [facility], [address] [place], [place] [address]      Provider Number: [id number]  Attending Physician Name and Address:  Ollen Bowl, MD  Relative Name and Phone Number:  Roy Snuffer [phone]    Current Level of Care: Hospital Recommended Level of Care: Skilled Nursing Facility Prior Approval Number:    Date Approved/Denied:   PASRR Number: [id number]  Discharge Plan: SNF    Current Diagnoses: Patient Active Problem List   Diagnosis Date Noted  . Intertrochanteric fracture of right femur, closed, initial encounter (HCC) 10/05/2020  . Dementia without behavioral disturbance (HCC) 10/05/2020  . Need for COVID-19 vaccine 09/10/2020  . Memory change 07/16/2020  . Essential hypertension 04/15/2020  . Low HDL (under 40) 04/28/2017  . Annual physical exam 09/21/2016  . Constipation 09/20/2015  . Elevated blood pressure, situational 09/20/2015  . Osteoporosis   . IFG (impaired fasting glucose)   . Mixed hyperlipidemia   . Obesity   . Hemorrhoid 07/20/2012  . Insomnia 07/20/2012    Orientation RESPIRATION BLADDER Height & Weight     Self, Time, Place, Situation  O2 (2L) External catheter Weight: 74.4 kg Height:  5\' 2"  (157.5 cm)  BEHAVIORAL SYMPTOMS/MOOD NEUROLOGICAL BOWEL NUTRITION STATUS      Continent Diet (Regular)  AMBULATORY STATUS COMMUNICATION OF NEEDS Skin   Extensive Assist Verbally Surgical wounds                       Personal Care Assistance Level of Assistance  Bathing, Feeding, Dressing Bathing Assistance: Limited assistance Feeding assistance: Independent Dressing Assistance: Limited assistance     Functional Limitations Info  Sight, Hearing, Speech  Sight Info: Adequate Hearing Info: Adequate Speech Info: Adequate    SPECIAL CARE FACTORS FREQUENCY  PT (By licensed PT), OT (By licensed OT)                    Contractures Contractures Info: Not present    Additional Factors Info  Code Status, Allergies Code Status Info: Full Allergies Info: No known allergies           Current Medications (10/07/2020):  This is the current hospital active medication list Current Facility-Administered Medications  Medication Dose Route Frequency Provider Last Rate Last Admin  . 0.9 %  sodium chloride infusion  75 mL/hr Intravenous Continuous 10/09/2020, MD 75 mL/hr at 10/06/20 1505 75 mL/hr at 10/06/20 1505  . acetaminophen (TYLENOL) tablet 650 mg  650 mg Oral Q6H PRN 10/08/20, MD      . alum & mag hydroxide-simeth (MAALOX/MYLANTA) 200-200-20 MG/5ML suspension 30 mL  30 mL Oral Q4H PRN 06-01-2001, MD      . amLODipine (NORVASC) tablet 5 mg  5 mg Oral Daily Juanell Fairly, MD   5 mg at 10/07/20 10/09/20  . atorvastatin (LIPITOR) tablet 10 mg  10 mg Oral QHS 2505, MD   10 mg at 10/06/20 2215  . bisacodyl (DULCOLAX) suppository 10 mg  10 mg Rectal Daily PRN 2216, MD      . cholecalciferol (VITAMIN D) tablet 1,000 Units  1,000 Units Oral Daily Juanell Fairly, MD   1,000 Units at 10/07/20 [phone]  . docusate sodium (COLACE) capsule 100  mg  100 mg Oral BID Juanell Fairly, MD   100 mg at 10/07/20 6659  . donepezil (ARICEPT) tablet 5 mg  5 mg Oral QHS Juanell Fairly, MD   5 mg at 10/06/20 2215  . enoxaparin (LOVENOX) injection 40 mg  40 mg Subcutaneous Q24H Juanell Fairly, MD   40 mg at 10/07/20 9357  . feeding supplement (ENSURE ENLIVE / ENSURE PLUS) liquid 237 mL  237 mL Oral BID BM Pahwani, Rinka R, MD      . HYDROcodone-acetaminophen (NORCO) 7.5-325 MG per tablet 1-2 tablet  1-2 tablet Oral Q4H PRN Juanell Fairly, MD      . HYDROcodone-acetaminophen (NORCO/VICODIN) 5-325 MG per tablet 1-2 tablet   1-2 tablet Oral Q4H PRN Juanell Fairly, MD   1 tablet at 10/07/20 0458  . loratadine (CLARITIN) tablet 10 mg  10 mg Oral Daily Juanell Fairly, MD   10 mg at 10/07/20 0177  . magnesium citrate solution 1 Bottle  1 Bottle Oral Once PRN Juanell Fairly, MD      . memantine [facility]) tablet 5 mg  5 mg Oral BID Juanell Fairly, MD   5 mg at 10/07/20 9390  . methocarbamol (ROBAXIN) tablet 500 mg  500 mg Oral Q6H PRN Juanell Fairly, MD       Or  . methocarbamol (ROBAXIN) 500 mg in dextrose 5 % 50 mL IVPB  500 mg Intravenous Q6H PRN Juanell Fairly, MD      . morphine 2 MG/ML injection 0.5-1 mg  0.5-1 mg Intravenous Q2H PRN Juanell Fairly, MD      . multivitamin with minerals tablet 1 tablet  1 tablet Oral Daily Juanell Fairly, MD   1 tablet at 10/07/20 3009  . mupirocin ointment (BACTROBAN) 2 %   Nasal BID Ollen Bowl, MD   Given at 10/07/20 [phone]  . ondansetron (ZOFRAN) tablet 4 mg  4 mg Oral Q6H PRN Juanell Fairly, MD       Or  . ondansetron [facility]) injection 4 mg  4 mg Intravenous Q6H PRN Juanell Fairly, MD      . polyethylene glycol (MIRALAX / GLYCOLAX) packet 17 g  17 g Oral Daily PRN Juanell Fairly, MD   17 g at 10/07/20 0762  . senna (SENOKOT) tablet 8.6 mg  1 tablet Oral BID Juanell Fairly, MD   8.6 mg at 10/07/20 2633  . traMADol (ULTRAM) tablet 50 mg  50 mg Oral Q6H Juanell Fairly, MD   50 mg at 10/07/20 1222     Discharge Medications: Please see discharge summary for a list of discharge medications.  Relevant Imaging Results:  Relevant Lab Results:   Additional Information SS# [id number]  Trenton Founds, RN

## 2020-10-07 NOTE — Evaluation (Signed)
Physical Therapy Evaluation Patient Details Name: Cheyenne Parker MRN: [id number] DOB: Dec 16, 1945 Today's Date: 10/07/2020   History of Present Illness  Pt is a 74 y.o. female presenting to hospital 11/13 s/p mechanical fall walking down basement steps.  Pt s/p 11/14 intramedullary fixation of R comminuted intertrochanteric hip fx.  Imaging also showing age-indeterminate rib fx of R lateral 3rd and 4th ribs.  PMH includes anemia, IFG, obesity, osteoporosis, dementia.  Clinical Impression  Prior to hospital admission, pt was independent with ambulation; lives with her husband on main level of home with steps to enter home.  Currently pt is 1-2 assist with bed mobility and only able to bring buttocks off bed to stand a couple inches with max cueing, 1 assist, RW use, and multiple attempts.  Pt intermittently resisting movement or not initiating to assist with movement requiring extra time and cueing for activities and for safety (pt reporting d/t pain and fear).  Pt reporting 0/10 R hip/thigh pain at rest beginning of session (although pt appearing uncomfortable in bed); increased pain noted with R LE movement and activities; and pain R hip/thigh 6/10 at rest end of session (nurse notified of pt's request for pain meds).  Pt would benefit from skilled PT to address noted impairments and functional limitations (see below for any additional details).  Upon hospital discharge, pt would benefit from STR.    Follow Up Recommendations SNF    Equipment Recommendations  Rolling walker with 5" wheels;3in1 (PT);Wheelchair (measurements PT);Wheelchair cushion (measurements PT) (youth sized)    Recommendations for Other Services OT consult     Precautions / Restrictions Precautions Precautions: Fall Restrictions Weight Bearing Restrictions: Yes RLE Weight Bearing: Weight bearing as tolerated      Mobility  Bed Mobility Overal bed mobility: Needs Assistance Bed Mobility: Supine to Sit;Sit to Supine      Supine to sit: Max assist;HOB elevated Sit to supine: Mod assist;Max assist;+2 for physical assistance;HOB elevated   General bed mobility comments: assist for trunk and B LE's; 2 assist to lay down in bed (d/t R hip/thigh pain) and to scoot up in bed using bed sheet    Transfers Overall transfer level: Needs assistance Equipment used: Rolling walker (2 wheeled) Transfers: Sit to/from Stand Sit to Stand: Total assist         General transfer comment: x6 trials standing; only able to bring buttocks off bed a couple inches 1x (on 5th trial); vc's and tactile cues for UE and LE placement each trial; vc's for overall technique  Ambulation/Gait             General Gait Details: unable to stand to attempt  Stairs            Wheelchair Mobility    Modified Rankin (Stroke Patients Only)       Balance Overall balance assessment: Needs assistance Sitting-balance support: Single extremity supported;Feet supported Sitting balance-Leahy Scale: Fair Sitting balance - Comments: pt requiring at least single UE support for sitting balance                                     Pertinent Vitals/Pain Pain Assessment: 0-10 Pain Location: R hip/thigh Pain Descriptors / Indicators: Aching;Operative site guarding;Discomfort;Grimacing;Guarding;Sore;Tender Pain Intervention(s): Limited activity within patient's tolerance;Monitored during session;Repositioned;Premedicated before session;Patient requesting pain meds-RN notified;Ice applied  Vitals (HR and O2 on 2 L O2 via nasal cannula) stable and WFL throughout treatment session.  Home Living Family/patient expects to be discharged to:: Private residence Living Arrangements: Spouse/significant other Available Help at Discharge: Family Type of Home: House Home Access: Stairs to enter Entrance Stairs-Rails: Right Entrance Stairs-Number of Steps: 2 Home Layout: One level;Laundry or work area in basement (8-9 steps  down to laundry with R railing descending) Home Equipment: Grab bars - tub/shower;Grab bars - toilet;Bedside commode;Shower seat - built in      Prior Function Level of Independence: Independent               Higher education careers adviser        Extremity/Trunk Assessment   Upper Extremity Assessment Upper Extremity Assessment: Defer to OT evaluation    Lower Extremity Assessment Lower Extremity Assessment: RLE deficits/detail (L LE WFL) RLE Deficits / Details: fair R quad set strength; at least 3/5 DF/PF AROM RLE: Unable to fully assess due to pain    Cervical / Trunk Assessment Cervical / Trunk Assessment: Other exceptions Cervical / Trunk Exceptions: forward head/shoulders  Communication   Communication: No difficulties  Cognition Arousal/Alertness: Awake/alert Behavior During Therapy: WFL for tasks assessed/performed Overall Cognitive Status: Within Functional Limits for tasks assessed                                 General Comments: Pt often closing her eyes during session but would open with cueing.      General Comments General comments (skin integrity, edema, etc.): no drainage noted R hip/thigh dressings.  Nursing cleared pt for participation in physical therapy.  Pt agreeable to PT session.    Exercises Total Joint Exercises Ankle Circles/Pumps: AROM;Strengthening;Both;10 reps;Supine Quad Sets: AROM;Strengthening;Both;10 reps;Supine Heel Slides: AAROM;Strengthening;Right;10 reps;Supine (minimal R knee bend d/t R hip/thigh pain) Hip ABduction/ADduction: AAROM;Strengthening;Right;10 reps;Supine (minimal ROM d/t R hip/thigh pain)   Assessment/Plan    PT Assessment Patient needs continued PT services  PT Problem List Decreased strength;Decreased range of motion;Decreased activity tolerance;Decreased balance;Decreased mobility;Decreased knowledge of use of DME;Decreased knowledge of precautions;Pain;Decreased skin integrity       PT Treatment  Interventions DME instruction;Gait training;Stair training;Functional mobility training;Therapeutic activities;Therapeutic exercise;Balance training;Patient/family education    PT Goals (Current goals can be found in the Care Plan section)  Acute Rehab PT Goals Patient Stated Goal: to improve mobility and pain PT Goal Formulation: With patient Time For Goal Achievement: 10/21/20 Potential to Achieve Goals: Good    Frequency BID   Barriers to discharge Decreased caregiver support      Co-evaluation               AM-PAC PT "6 Clicks" Mobility  Outcome Measure Help needed turning from your back to your side while in a flat bed without using bedrails?: A Lot Help needed moving from lying on your back to sitting on the side of a flat bed without using bedrails?: A Lot Help needed moving to and from a bed to a chair (including a wheelchair)?: Total Help needed standing up from a chair using your arms (e.g., wheelchair or bedside chair)?: Total Help needed to walk in hospital room?: Total Help needed climbing 3-5 steps with a railing? : Total 6 Click Score: 8    End of Session Equipment Utilized During Treatment: Gait belt;Oxygen (2 L O2 via nasal cannula) Activity Tolerance: Patient limited by pain Patient left: in bed;with call bell/phone within reach;with bed alarm set;with SCD's reapplied;Other (comment) (B heels floating via pillow support) Nurse Communication: Mobility status;Patient requests pain  meds;Precautions;Weight bearing status PT Visit Diagnosis: Other abnormalities of gait and mobility (R26.89);History of falling (Z91.81);Muscle weakness (generalized) (M62.81);Difficulty in walking, not elsewhere classified (R26.2);Pain Pain - Right/Left: Right Pain - part of body: Hip    Time: 0918-1006 PT Time Calculation (min) (ACUTE ONLY): 48 min   Charges:   PT Evaluation $PT Eval Low Complexity: 1 Low PT Treatments $Therapeutic Exercise: 8-22 mins $Therapeutic  Activity: 8-22 mins       Hendricks Limes, PT 10/07/20, 10:43 AM

## 2020-10-07 NOTE — Anesthesia Postprocedure Evaluation (Signed)
Anesthesia Post Note  Patient: Cheyenne Parker  Procedure(s) Performed: INTRAMEDULLARY (IM) NAIL INTERTROCHANTRIC (Right Leg Upper)  Patient location during evaluation: Nursing Unit Anesthesia Type: Spinal Level of consciousness: awake Pain management: pain level controlled Respiratory status: spontaneous breathing Cardiovascular status: stable Postop Assessment: no headache Anesthetic complications: no   No complications documented.   Last Vitals:  Vitals:   10/06/20 2353 10/07/20 0510  BP: 138/63 (!) 143/62  Pulse: 89 92  Resp: 18 19  Temp: 37.3 C 37.4 C  SpO2: 95% 96%    Last Pain:  Vitals:   10/07/20 0555  TempSrc:   PainSc: Asleep                 Jaye Beagle

## 2020-10-07 NOTE — TOC Progression Note (Signed)
Transition of Care [facility]) - Progression Note    Patient Details  Name: AYRA HODGDON MRN: [id number] Date of Birth: 30-Oct-1946  Transition of Care [facility]) CM/SW [place], RN Phone Number: 10/07/2020, 3:19 PM  Clinical Narrative:   RNCM met with patient at bedside to discuss discharge planning. Patient reports that she normally lives independently at home with her husband and typically goes to [facility] every day. Patient is agreeable to plan for SNF at discharge and reports that she has never had to go to SNF before. She reports that she does not have any preferences as to which facility and would like to reach out to all facilities in the area to see who has availability.  RNCM completed PASSR, FL-2 and started bed search.          Expected Discharge Plan and Services                                                 Social Determinants of Health (SDOH) Interventions    Readmission Risk Interventions No flowsheet data found.

## 2020-10-07 NOTE — Progress Notes (Signed)
Subjective:  POD #1 s/p intramedullary fixation for right intertrochanteric hip fracture.   Patient reports right hip pain as mild to moderate.  Patient was able to participate with physical therapy today.  She has no other complaints.  Patient is seen lying in her hospital bed with her nursing staff and physical therapist at the bedside.  Objective:   VITALS:   Vitals:   10/06/20 2353 10/07/20 0510 10/07/20 0757 10/07/20 1212  BP: 138/63 (!) 143/62 129/62 129/65  Pulse: 89 92 80 87  Resp: 18 19 15 16   Temp: 99.1 F (37.3 C) 99.3 F (37.4 C) 98.4 F (36.9 C) 99 F (37.2 C)  TempSrc: Oral Oral Oral Oral  SpO2: 95% 96% (!) 87% 98%  Weight:      Height:        PHYSICAL EXAM: Right lower extremity Neurovascular intact Sensation intact distally Intact pulses distally Dorsiflexion/Plantar flexion intact Incision: dressing C/D/I No cellulitis present Compartment soft  LABS  Results for orders placed or performed during the hospital encounter of 10/05/20 (from the past 24 hour(s))  CBC     Status: Abnormal   Collection Time: 10/07/20  4:49 AM  Result Value Ref Range   WBC 9.1 4.0 - 10.5 K/uL   RBC 2.67 (L) 3.87 - 5.11 MIL/uL   Hemoglobin 8.6 (L) 12.0 - 15.0 g/dL   HCT 10/09/20 (L) 36 - 46 %   MCV 94.0 80.0 - 100.0 fL   MCH 32.2 26.0 - 34.0 pg   MCHC 34.3 30.0 - 36.0 g/dL   RDW 44.3 15.4 - 00.8 %   Platelets 141 (L) 150 - 400 K/uL   nRBC 0.0 0.0 - 0.2 %  Basic metabolic panel     Status: Abnormal   Collection Time: 10/07/20  4:49 AM  Result Value Ref Range   Sodium 139 135 - 145 mmol/L   Potassium 3.7 3.5 - 5.1 mmol/L   Chloride 105 98 - 111 mmol/L   CO2 25 22 - 32 mmol/L   Glucose, Bld 127 (H) 70 - 99 mg/dL   BUN 19 8 - 23 mg/dL   Creatinine, Ser 10/09/20 0.44 - 1.00 mg/dL   Calcium 8.4 (L) 8.9 - 10.3 mg/dL   GFR, Estimated 1.95 >09 mL/min   Anion gap 9 5 - 15    CT CHEST W CONTRAST  Result Date: 10/05/2020 CLINICAL DATA:  Abnormal chest x-ray. EXAM: CT CHEST WITH  CONTRAST TECHNIQUE: Multidetector CT imaging of the chest was performed during intravenous contrast administration. CONTRAST:  43mL OMNIPAQUE IOHEXOL 300 MG/ML  SOLN COMPARISON:  Same day chest radiograph. FINDINGS: Cardiovascular: Scattered atherosclerotic calcifications throughout the aorta. Predominately LEFT-sided coronary artery atherosclerotic calcifications. Asymmetric enlargement of the main pulmonary artery in relation to the ascending thoracic aorta. No pericardial effusion. Heart is at the upper limits of normal in size. Mediastinum/Nodes: No enlarged mediastinal, hilar, or axillary lymph nodes. Thyroid gland, trachea, and esophagus demonstrate no significant findings. Lungs/Pleura: Bibasilar dependent consolidative opacities most consistent with atelectasis. No pleural effusion or pneumothorax. LEFT lower lobe pulmonary nodule measures 6 mm (series 2, image 61). Upper Abdomen: No acute abnormality.  Cholelithiasis. Musculoskeletal: Remote rib fractures. Age-indeterminate rib fracture of the RIGHT lateral third and fourth rib. Mild degenerative changes of the thoracic spine. 1 IMPRESSION: 1. Age-indeterminate rib fracture of the RIGHT lateral third and fourth rib. Correlate for point tenderness. 2. Asymmetric enlargement of the main pulmonary artery in relation to the ascending thoracic aorta, which can be seen with pulmonary  arterial hypertension. This may account for the contour abnormality on chest radiograph. 3. 6 mm LEFT lower lobe pulmonary nodule. Non-contrast chest CT at 6-12 months is recommended. If the nodule is stable at time of repeat CT, then future CT at 18-24 months (from today's scan) is considered optional for low-risk patients, but is recommended for high-risk patients. This recommendation follows the consensus statement: Guidelines for Management of Incidental Pulmonary Nodules Detected on CT Images: From the Fleischner Society 2017; Radiology 2017; 284:228-243. Aortic Atherosclerosis  (ICD10-I70.0). Electronically Signed   By: Meda Klinefelter MD   On: 10/05/2020 17:34   DG C-Arm 1-60 Min  Result Date: 10/06/2020 CLINICAL DATA:  Right femur fracture, ORIF EXAM: DG C-ARM 1-60 MIN; RIGHT FEMUR 2 VIEWS COMPARISON:  COMPARISON Hip radiograph 10/05/2020 FINDINGS: Fluoroscopic spot images demonstrate femoral IM nail and hip screw fixation of a intertrochanteric fracture. Separate lesser trochanteric fragment. Single interlocking screw distally in the IM nail. No complicating feature. IMPRESSION: 1. ORIF of a right intertrochanteric fracture. Electronically Signed   By: Gaylyn Rong M.D.   On: 10/06/2020 15:21   DG FEMUR, MIN 2 VIEWS RIGHT  Result Date: 10/06/2020 CLINICAL DATA:  Right femur fracture, ORIF EXAM: DG C-ARM 1-60 MIN; RIGHT FEMUR 2 VIEWS COMPARISON:  COMPARISON Hip radiograph 10/05/2020 FINDINGS: Fluoroscopic spot images demonstrate femoral IM nail and hip screw fixation of a intertrochanteric fracture. Separate lesser trochanteric fragment. Single interlocking screw distally in the IM nail. No complicating feature. IMPRESSION: 1. ORIF of a right intertrochanteric fracture. Electronically Signed   By: Gaylyn Rong M.D.   On: 10/06/2020 15:21   DG FEMUR, MIN 2 VIEWS RIGHT  Result Date: 10/06/2020 CLINICAL DATA:  Right femur IM nail EXAM: RIGHT FEMUR 2 VIEWS COMPARISON:  Hip radiograph 10/05/2020 FINDINGS: Right femoral IM nail with hip screw noted, the intramedullary nail is long stem extending to the distal metadiaphysis, with a single distal interlocking screw. Near anatomic alignment of the intertrochanteric fracture aside from the mildly displaced lesser trochanteric fragment. IMPRESSION: 1. Right femoral IM nail placement for ORIF of intertrochanteric fracture. Near anatomic alignment aside from the mildly displaced lesser trochanteric fragment. Electronically Signed   By: Gaylyn Rong M.D.   On: 10/06/2020 15:19    Assessment/Plan: 1 Day Post-Op    Principal Problem:   Intertrochanteric fracture of right femur, closed, initial encounter (HCC) Active Problems:   Obesity   Essential hypertension   Dementia without behavioral disturbance (HCC)  Patient's Foley catheter has been removed.  Patient is complete 24 hours of postop antibiotics.  Continue physical therapy.  Patient is doing well from orthopedic standpoint.  Her hemoglobin and hematocrit are within acceptable limits.  She is afebrile with stable vital signs.  The patient is starting Lovenox today for DVT prophylaxis.    Juanell Fairly , MD 10/07/2020, 3:17 PM

## 2020-10-07 NOTE — Evaluation (Signed)
Occupational Therapy Evaluation Patient Details Name: Cheyenne Parker MRN: [id number] DOB: 1946/04/18 Today's Date: 10/07/2020    History of Present Illness Pt is a 74 y.o. female presenting to hospital 11/13 s/p mechanical fall walking down basement steps.  Pt s/p 11/14 intramedullary fixation of R comminuted intertrochanteric hip fx.  Imaging also showing age-indeterminate rib fx of R lateral 3rd and 4th ribs.  PMH includes anemia, IFG, obesity, osteoporosis, dementia.   Clinical Impression   Pt seen for OT evaluation this date in setting of acute hospitalization following fall and now s/p IM fixation of R hip fx. Pt reports being INDEP with BADLs at baseline with some assist from spouse for IADLs including driving, cooking and cleaning. Pt presents this date with significant amount of pain impacting her fxl performance. Pt requires MOD A +2 and then MIN A +2 for sit to stand trials. B/l knee blocking required to sustain static stand. Steps attempted with 2p assist with limited tolerance for weight shifting demo'ed by pt. Anticipate pt will require f/u therapy at [facility] following acute stay d/t current fxl status relative to her PLOF.      SNF    Equipment Recommendations  Other (comment) (defer to next level of care)    Recommendations for Other Services       Precautions / Restrictions Precautions Precautions: Fall Restrictions Weight Bearing Restrictions: Yes RLE Weight Bearing: Weight bearing as tolerated      Mobility Bed Mobility Overal bed mobility: Needs Assistance Bed Mobility: Supine to Sit;Sit to Supine     Supine to sit: Max assist;HOB elevated Sit to supine: Mod assist;Max assist;+2 for physical assistance;HOB elevated   General bed mobility comments: assist for trunk and B LE's; 2 assist to lay down in bed (d/t R hip/thigh pain) and to scoot up in bed using bed sheet    Transfers Overall transfer level: Needs assistance Equipment used: Rolling walker (2  wheeled) Transfers: Sit to/from Stand Sit to Stand: Min assist;Mod assist;+2 physical assistance;From elevated surface         General transfer comment: mod assist x2 1st trial and min assist x2 2nd trial; B knees blocked; tactile cues for UE placement; assist to control descent sitting    Balance Overall balance assessment: Needs assistance Sitting-balance support: Single extremity supported;Feet supported Sitting balance-Leahy Scale: Fair Sitting balance - Comments: steady sitting static with at least single UE support   Standing balance support: Bilateral upper extremity supported Standing balance-Leahy Scale: Poor Standing balance comment: pt requires UE support and MIN A +2 to sustain static sit as well as posterior tactile cues/support to encourage full extension of hips.               High Level Balance Comments: pt requiring B UE support on RW for static standing balance (and B knees blocked for safety)           ADL either performed or assessed with clinical judgement   ADL                                         General ADL Comments: MAX A for seated LB ADLs, MOD A +2 for standing LB ADLs such as bathing/peri care.     Vision Patient Visual Report: No change from baseline       Perception     Praxis      Pertinent Vitals/Pain Pain  Assessment: 0-10 Pain Score: 8  Pain Location: R hip/thigh Pain Descriptors / Indicators: Aching;Operative site guarding;Discomfort;Grimacing;Guarding;Sore;Tender Pain Intervention(s): Limited activity within patient's tolerance;Monitored during session;Repositioned (notified RN of pt pain level)     Hand Dominance     Extremity/Trunk Assessment Upper Extremity Assessment Upper Extremity Assessment: Overall WFL for tasks assessed;Generalized weakness (ROM WFL, MMT grossly 4-/5)   Lower Extremity Assessment Lower Extremity Assessment: Defer to PT evaluation;RLE deficits/detail RLE: Unable to fully  assess due to pain       Communication Communication Communication: No difficulties   Cognition Arousal/Alertness: Awake/alert Behavior During Therapy: WFL for tasks assessed/performed Overall Cognitive Status: Within Functional Limits for tasks assessed                                 General Comments: Pt is appropriate with all commands/cues and conversationally. Pt does demo some eye closing throughout session, but attends to task/cues   General Comments       Exercises Other Exercises Other Exercises: OT facilitates ed re: role of OT in acute setting, benefits/importance of OOB Activity, safety/fall prevention considerations. Pt with moderate reception.   Shoulder Instructions      Home Living Family/patient expects to be discharged to:: Private residence   Available Help at Discharge: Family Type of Home: House Home Access: Stairs to enter [employer] of Steps: 2 Entrance Stairs-Rails: Right Home Layout: One level;Laundry or work area in [employer] Shower/Tub: Tub/shower unit;Walk-in shower   Bathroom Toilet: Standard     Home Equipment: Grab bars - tub/shower;Grab bars - toilet;Bedside commode;Shower seat - built in          Prior Functioning/Environment Level of Independence: Independent                 OT Problem List: Decreased strength;Decreased range of motion;Decreased activity tolerance;Impaired balance (sitting and/or standing);Decreased safety awareness;Decreased knowledge of use of DME or AE;Pain      OT Treatment/Interventions: Self-care/ADL training;DME and/or AE instruction;Therapeutic activities;Balance training;Therapeutic exercise;Energy conservation;Patient/family education    OT Goals(Current goals can be found in the care plan section) Acute Rehab OT Goals Patient Stated Goal: to improve mobility and pain OT Goal Formulation: With patient Time For Goal Achievement: 10/21/20 Potential to Achieve  Goals: Good ADL Goals Pt Will Perform Lower Body Dressing: with min assist;with mod assist;with adaptive equipment;sitting/lateral leans Pt Will Transfer to Toilet: with min assist;with mod assist;stand pivot transfer;bedside commode Pt Will Perform Toileting - Clothing Manipulation and hygiene: with min assist;with mod assist;sit to/from stand Pt/caregiver will Perform Home Exercise Program: Increased strength;Both right and left upper extremity;With Supervision  OT Frequency: Min 1X/week   Barriers to D/C:            Co-evaluation PT/OT/SLP Co-Evaluation/Treatment: Yes Reason for Co-Treatment: For patient/therapist safety;To address functional/ADL transfers PT goals addressed during session: Mobility/safety with mobility;Proper use of DME OT goals addressed during session: ADL's and self-care      AM-PAC OT "6 Clicks" Daily Activity     Outcome Measure Help from another person eating meals?: None Help from another person taking care of personal grooming?: A Little Help from another person toileting, which includes using toliet, bedpan, or urinal?: A Lot Help from another person bathing (including washing, rinsing, drying)?: A Lot Help from another person to put on and taking off regular upper body clothing?: A Little Help from another person to put on and taking off  regular lower body clothing?: A Lot 6 Click Score: 16   End of Session Equipment Utilized During Treatment: Gait belt;Rolling walker Nurse Communication: Mobility status  Activity Tolerance: Patient tolerated treatment well Patient left: in bed;with call bell/phone within reach;with nursing/sitter in room (CNA present to do a bladder scan and requests that bed stays high and states she will replace purewick/set bed alarm.)  OT Visit Diagnosis: Unsteadiness on feet (R26.81);Muscle weakness (generalized) (M62.81);Pain Pain - Right/Left: Right Pain - part of body: Hip                Time: 1444-1500 OT Time  Calculation (min): 16 min Charges:  OT Evaluation $OT Eval Moderate Complexity: 1 [address], [place], OTR/L ascom [phone] 10/07/20, 4:57 PM

## 2020-10-07 NOTE — Progress Notes (Signed)
Initial Nutrition Assessment  DOCUMENTATION CODES:   Not applicable  INTERVENTION:   Ensure Enlive po BID, each supplement provides 350 kcal and 20 grams of protein  MVI daily   NUTRITION DIAGNOSIS:   Increased nutrient needs related to hip fracture as evidenced by estimated needs.  GOAL:   Patient will meet greater than or equal to 90% of their needs  MONITOR:   PO intake, Supplement acceptance, Labs, Weight trends, Skin, I & O's  REASON FOR ASSESSMENT:   Consult Hip fracture protocol  ASSESSMENT:   74 year old female with past medical history of hypertension, hyperlipidemia and dementia who presents with R hip fracture after fall now s/p  intramedullary fixation 11/14  Met with pt in room today. Pt reports good appetite and oral intake at baseline but reports her appetite has been poor in hospital r/t pain. Pt ate only a few bites of her breakfast this morning and was eating only sips and bites after surgery yesterday. RD discussed with pt the importance of adequate nutrition needed to support post-op healing and prevent loss of lean muscle. Pt is willing to drink strawberry Ensure in hospital. RD will add supplements to help pt meet her estimated needs. Per chart, pt down 11lbs(7%) in < 1 month; this is significant.   Medications reviewed and include: vitamin D, colace, lovenox, MVI, senokot, tramadol  Labs reviewed: Hgb 8.6(L), Hct 25.1(L)  NUTRITION - FOCUSED PHYSICAL EXAM:    Most Recent Value  Orbital Region No depletion  Upper Arm Region No depletion  Thoracic and Lumbar Region No depletion  Buccal Region No depletion  Temple Region No depletion  Clavicle Bone Region Mild depletion  Clavicle and Acromion Bone Region Mild depletion  Scapular Bone Region No depletion  Dorsal Hand Mild depletion  Patellar Region No depletion  Anterior Thigh Region No depletion  Posterior Calf Region No depletion  Edema (RD Assessment) Mild  Hair Reviewed  Eyes Reviewed   Mouth Reviewed  Skin Reviewed  Nails Reviewed     Diet Order:   Diet Order            Diet regular Room service appropriate? Yes; Fluid consistency: Thin  Diet effective now                EDUCATION NEEDS:   Education needs have been addressed  Skin:  Skin Assessment: Reviewed RN Assessment (incision R hip)  Last BM:  pta  Height:   Ht Readings from Last 1 Encounters:  10/05/20 _0  (1.575 m)    Weight:   Wt Readings from Last 1 Encounters:  10/05/20 74.4 kg    Ideal Body Weight:  50 kg  BMI:  Body mass index is 30 kg/m.  Estimated Nutritional Needs:   Kcal:  1500-1700kcal/day  Protein:  75-85g/day  Fluid:  1.3-1.5L/day  Koleen Distance MS, RD, LDN Please refer to [facility] for RD and/or RD on-call/weekend/after hours pager

## 2020-10-07 NOTE — Progress Notes (Signed)
Physical Therapy Treatment Patient Details Name: Cheyenne Parker MRN: [id number] DOB: 1946-11-05 Today's Date: 10/07/2020    History of Present Illness Pt is a 74 y.o. female presenting to hospital 11/13 s/p mechanical fall walking down basement steps.  Pt s/p 11/14 intramedullary fixation of R comminuted intertrochanteric hip fx.  Imaging also showing age-indeterminate rib fx of R lateral 3rd and 4th ribs.  PMH includes anemia, IFG, obesity, osteoporosis, dementia.    PT Comments    Pt sleeping in bed upon PT arrival; woken with cueing.  Pt reports feeling better than this morning but also reporting 8/10 R hip/thigh pain (nurse notified of pt's pain report).  Max assist x1 semi-supine to sitting edge of bed with max cueing and assist.  Performed PT/OT co-session.  Mod assist x2 1st trial standing and min assist x2 2nd trial standing (use of RW, B knees blocked, vc's and tactile cues for technique).  Attempted to take steps in standing with B UE support on RW--pt able to slightly lift R foot off ground but unable to clear L foot to advance (pt sliding L foot on floor instead to attempt to advance).  2 assist sitting to semi-supine in bed.  Will continue to progress pt with strengthening and progressive functional mobility per pt tolerance.    Follow Up Recommendations  SNF     Equipment Recommendations  Rolling walker with 5" wheels;3in1 (PT);Wheelchair (measurements PT);Wheelchair cushion (measurements PT) (youth sized)    Recommendations for Other Services OT consult     Precautions / Restrictions Precautions Precautions: Fall Restrictions Weight Bearing Restrictions: Yes RLE Weight Bearing: Weight bearing as tolerated    Mobility  Bed Mobility Overal bed mobility: Needs Assistance Bed Mobility: Supine to Sit;Sit to Supine     Supine to sit: Max assist;HOB elevated Sit to supine: Mod assist;Max assist;+2 for physical assistance;HOB elevated   General bed mobility comments:  assist for trunk and B LE's; 2 assist to lay down in bed (d/t R hip/thigh pain) and to scoot up in bed using bed sheet  Transfers Overall transfer level: Needs assistance Equipment used: Rolling walker (2 wheeled) Transfers: Sit to/from Stand Sit to Stand: Min assist;Mod assist;+2 physical assistance;From elevated surface         General transfer comment: mod assist x2 1st trial and min assist x2 2nd trial; B knees blocked; tactile cues for UE placement; assist to control descent sitting  Ambulation/Gait Ambulation/Gait assistance: +2 physical assistance Gait Distance (Feet): 0 Feet Assistive device: Rolling walker (2 wheeled)   Gait velocity: decreased   General Gait Details: pt able to slightly lift R foot from ground but unable to lift L foot from ground (sliding L foot on floor to advance instead); B knees blocked; vc's for technique   Stairs             Wheelchair Mobility    Modified Rankin (Stroke Patients Only)       Balance Overall balance assessment: Needs assistance Sitting-balance support: Single extremity supported;Feet supported Sitting balance-Leahy Scale: Fair Sitting balance - Comments: steady sitting static with at least single UE support   Standing balance support: Bilateral upper extremity supported Standing balance-Leahy Scale: Fair                 High Level Balance Comments: pt requiring B UE support on RW for static standing balance (and B knees blocked for safety)            Cognition Arousal/Alertness: Awake/alert (sleeping upon PT  arrival but woken with vc's; pt closing her eyes at times) Behavior During Therapy: [facility] for tasks assessed/performed Overall Cognitive Status: Within Functional Limits for tasks assessed                                        Exercises      General Comments        Pertinent Vitals/Pain Pain Assessment: 0-10 Pain Score: 8  Pain Location: R hip/thigh Pain Descriptors /  Indicators: Aching;Operative site guarding;Discomfort;Grimacing;Guarding;Sore;Tender Pain Intervention(s): Limited activity within patient's tolerance;Monitored during session;Repositioned (RN notified of pt's pain level and NA notified of need for new ice for ice pack)  Vitals (HR and O2 on 2 L via nasal cannula) stable and WFL throughout treatment session.    Home Living                      Prior Function            PT Goals (current goals can now be found in the care plan section) Acute Rehab PT Goals Patient Stated Goal: to improve mobility and pain PT Goal Formulation: With patient Time For Goal Achievement: 10/21/20 Potential to Achieve Goals: Good Progress towards PT goals: Progressing toward goals    Frequency    BID      PT Plan Current plan remains appropriate    Co-evaluation PT/OT/SLP Co-Evaluation/Treatment: Yes Reason for Co-Treatment: For patient/therapist safety;To address functional/ADL transfers PT goals addressed during session: Mobility/safety with mobility;Proper use of DME OT goals addressed during session: ADL's and self-care      AM-PAC PT "6 Clicks" Mobility   Outcome Measure  Help needed turning from your back to your side while in a flat bed without using bedrails?: A Lot Help needed moving from lying on your back to sitting on the side of a flat bed without using bedrails?: A Lot Help needed moving to and from a bed to a chair (including a wheelchair)?: Total Help needed standing up from a chair using your arms (e.g., wheelchair or bedside chair)?: Total Help needed to walk in hospital room?: Total Help needed climbing 3-5 steps with a railing? : Total 6 Click Score: 8    End of Session Equipment Utilized During Treatment: Gait belt;Oxygen (2 L O2 via nasal cannula) Activity Tolerance: Patient limited by pain Patient left: in bed;with call bell/phone within reach;with bed alarm set;with SCD's reapplied;Other (comment);with  nursing/sitter in room (NT reported they would give pt ice pack and tray when done working with pt) Nurse Communication: Mobility status;Patient requests pain meds;Precautions;Weight bearing status PT Visit Diagnosis: Other abnormalities of gait and mobility (R26.89);History of falling (Z91.81);Muscle weakness (generalized) (M62.81);Difficulty in walking, not elsewhere classified (R26.2);Pain Pain - Right/Left: Right Pain - part of body: Hip     Time: 1430-1500 PT Time Calculation (min) (ACUTE ONLY): 30 min  Charges:  $Therapeutic Activity: 23-37 mins                     Hendricks Limes, PT 10/07/20, 3:25 PM

## 2020-10-07 NOTE — Progress Notes (Signed)
PROGRESS NOTE    Cheyenne Parker  SNK:[id number] DOB: Mar 09, 1946 DOA: 10/05/2020 PCP: Corky Downs, MD   Brief Narrative:  Patient is 74 year old female with past medical history of hypertension, hyperlipidemia, dementia, obesity with BMI of 30 presents to emergency department for evaluation of right hip pain after a mechanical fall at home.  ED course: Upon arrival: Patient's vital signs are stable, initial lab status CBC, BMP: WNL, COVID-19 negative.  X-ray of right hip shows comminuted impacted fracture of intertrochanteric portion of right femur.  Orthopedic surgery consulted.  Patient admitted for further evaluation and management of her right femur fracture.  Assessment & Plan:   Right intertrochanteric femur fracture: -Status post mechanical fall. -Status post intramedullary fixation of right comminuted intertrochanteric hip fracture, POD: 1 -Orthopedic surgery consulted -Continue with pain medication-tramadol, morphine, Robaxin, Norco as needed. -Monitor H&H and vitals closely. DC foley.  Acute anemia: Blood loss versus hemodilution? -H&H dropped from 12.0-8.6 this morning.  Repeat CBC and monitor H&H closely and transfuse as needed.  Hypertension: Stable -Continue amlodipine.  Dementia: -Continue  Aricept and Namenda  Hyperlipidemia: Continue statin.  Obesity with BMI of 30: -Diet modification/exercise and weight loss recommended.  Constipation: Likely in the setting of pain medications -Continue with MiraLAX and stool softeners.  DVT prophylaxis: SCD/Lovenox code Status: Full code Family Communication:  None present at bedside.  Plan of care discussed with patient in length and she verbalized understanding and agreed with it. Disposition Plan: Home versus SNF  Consultants:   Orthopedic surgery  Procedures:  Intramedullary fixation of right intertrochanteric hip fracture  Antimicrobials:   Cefazolin  Status is: Inpatient  Dispo: The patient is from:  Home              Anticipated d/c is to: SNF              Anticipated d/c date is: 2 days              Patient currently is not medically stable to d/c.   Subjective: Patient seen and examined.  Resting comfortably on the bed.  Did well with the surgery.  Reports improvement in the pain.  Denies chest pain, shortness of breath, nausea, vomiting, abdominal pain.  Not had bowel movement yet. Objective: Vitals:   10/06/20 2353 10/07/20 0510 10/07/20 0757 10/07/20 1212  BP: 138/63 (!) 143/62 129/62 129/65  Pulse: 89 92 80 87  Resp: 18 19 15 16   Temp: 99.1 F (37.3 C) 99.3 F (37.4 C) 98.4 F (36.9 C) 99 F (37.2 C)  TempSrc: Oral Oral Oral Oral  SpO2: 95% 96% (!) 87% 98%  Weight:      Height:        Intake/Output Summary (Last 24 hours) at 10/07/2020 1350 Last data filed at 10/07/2020 10/09/2020 Gross per 24 hour  Intake 0 ml  Output 250 ml  Net -250 ml   Filed Weights   10/05/20 1406  Weight: 74.4 kg    Examination:  General exam: Appears calm and comfortable, on 2 L oxygen via nasal cannula, communicating well, appears dehydrated. Respiratory system: Clear to auscultation. Respiratory effort normal. Cardiovascular system: S1 & S2 heard, RRR. No JVD, murmurs, rubs, gallops or clicks. No pedal edema. Gastrointestinal system: Abdomen is nondistended, soft and nontender. No organomegaly or masses felt. Normal bowel sounds heard. Central nervous system: Alert and oriented. No focal neurological deficits. Extremities: Right leg: dressing intact.  No signs of active bleeding seen.  Able to move her feet with  no issues.  Sensation intact.  Pulses intact. Skin: No rashes, lesions or ulcers. Psychiatry: Judgement and insight appear normal. Mood & affect appropriate.   Data Reviewed: I have personally reviewed following labs and imaging studies  CBC: Recent Labs  Lab 10/05/20 1405 10/06/20 0414 10/07/20 0449  WBC 9.9 10.0 9.1  NEUTROABS 6.2  --   --   HGB 12.0 10.0* 8.6*  HCT  36.3 30.6* 25.1*  MCV 92.4 93.9 94.0  PLT 199 186 141*   Basic Metabolic Panel: Recent Labs  Lab 10/05/20 1405 10/06/20 0414 10/07/20 0449  NA 136 136 139  K 3.7 4.7 3.7  CL 102 105 105  CO2 24 27 25   GLUCOSE 116* 135* 127*  BUN 17 18 19   CREATININE 0.93 0.84 0.76  CALCIUM 8.9 8.7* 8.4*   GFR: Estimated Creatinine Clearance: 59.1 mL/min (by C-G formula based on SCr of 0.76 mg/dL). Liver Function Tests: No results for input(s): AST, ALT, ALKPHOS, BILITOT, PROT, ALBUMIN in the last 168 hours. No results for input(s): LIPASE, AMYLASE in the last 168 hours. No results for input(s): AMMONIA in the last 168 hours. Coagulation Profile: Recent Labs  Lab 10/05/20 1405  INR 1.0   Cardiac Enzymes: No results for input(s): CKTOTAL, CKMB, CKMBINDEX, TROPONINI in the last 168 hours. BNP (last 3 results) No results for input(s): PROBNP in the last 8760 hours. HbA1C: No results for input(s): HGBA1C in the last 72 hours. CBG: No results for input(s): GLUCAP in the last 168 hours. Lipid Profile: No results for input(s): CHOL, HDL, LDLCALC, TRIG, CHOLHDL, LDLDIRECT in the last 72 hours. Thyroid Function Tests: No results for input(s): TSH, T4TOTAL, FREET4, T3FREE, THYROIDAB in the last 72 hours. Anemia Panel: No results for input(s): VITAMINB12, FOLATE, FERRITIN, TIBC, IRON, RETICCTPCT in the last 72 hours. Sepsis Labs: No results for input(s): PROCALCITON, LATICACIDVEN in the last 168 hours.  Recent Results (from the past 240 hour(s))  Respiratory Panel by RT PCR (Flu A&B, Covid) - Nasopharyngeal Swab     Status: None   Collection Time: 10/05/20  2:56 PM   Specimen: Nasopharyngeal Swab  Result Value Ref Range Status   SARS Coronavirus 2 by RT PCR NEGATIVE NEGATIVE Final    Comment: (NOTE) SARS-CoV-2 target nucleic acids are NOT DETECTED.  The SARS-CoV-2 RNA is generally detectable in upper respiratoy specimens during the acute phase of infection. The lowest concentration of  SARS-CoV-2 viral copies this assay can detect is 131 copies/mL. A negative result does not preclude SARS-Cov-2 infection and should not be used as the sole basis for treatment or other patient management decisions. A negative result may occur with  improper specimen collection/handling, submission of specimen other than nasopharyngeal swab, presence of viral mutation(s) within the areas targeted by this assay, and inadequate number of viral copies (<131 copies/mL). A negative result must be combined with clinical observations, patient history, and epidemiological information. The expected result is Negative.  Fact Sheet for Patients:  10/07/20  Fact Sheet for Healthcare Providers:  10/07/20  This test is no t yet approved or cleared by the https://www.moore.com/ FDA and  has been authorized for detection and/or diagnosis of SARS-CoV-2 by FDA under an Emergency Use Authorization (EUA). This EUA will remain  in effect (meaning this test can be used) for the duration of the COVID-19 declaration under Section 564(b)(1) of the Act, 21 U.S.C. section 360bbb-3(b)(1), unless the authorization is terminated or revoked sooner.     Influenza A by PCR NEGATIVE NEGATIVE Final  Influenza B by PCR NEGATIVE NEGATIVE Final    Comment: (NOTE) The Xpert Xpress SARS-CoV-2/FLU/RSV assay is intended as an aid in  the diagnosis of influenza from Nasopharyngeal swab specimens and  should not be used as a sole basis for treatment. Nasal washings and  aspirates are unacceptable for Xpert Xpress SARS-CoV-2/FLU/RSV  testing.  Fact Sheet for Patients: https://www.moore.com/  Fact Sheet for Healthcare Providers: https://www.young.biz/  This test is not yet approved or cleared by the [place] FDA and  has been authorized for detection and/or diagnosis of SARS-CoV-2 by  FDA under an Emergency Use  Authorization (EUA). This EUA will remain  in effect (meaning this test can be used) for the duration of the  Covid-19 declaration under Section 564(b)(1) of the Act, 21  U.S.C. section 360bbb-3(b)(1), unless the authorization is  terminated or revoked. Performed at [facility], [address], [place], [place] [address]   Surgical pcr screen     Status: Abnormal   Collection Time: 10/06/20  9:50 AM   Specimen: Nasal Mucosa; Nasal Swab  Result Value Ref Range Status   MRSA, PCR NEGATIVE NEGATIVE Final   Staphylococcus aureus POSITIVE (A) NEGATIVE Final    Comment: (NOTE) The Xpert SA Assay (FDA approved for NASAL specimens in patients 18 years of age and older), is one component of a comprehensive surveillance program. It is not intended to diagnose infection nor to guide or monitor treatment. Performed at [facility], [address], [place], [place] [address]       Radiology Studies: DG Chest 1 View  Result Date: 10/05/2020 CLINICAL DATA:  Post fall. EXAM: CHEST  1 VIEW COMPARISON:  February 18, 2016 FINDINGS: Apparent contour irregularity of the right heart border and right mediastinum. No evidence of cardiomegaly. There is no evidence of focal airspace consolidation, pleural effusion or pneumothorax. Osseous structures are without acute abnormality. Soft tissues are grossly normal. IMPRESSION: Apparent contour irregularity of the right heart border and right mediastinum. This may be projectional however it may represent a mediastinal mass or lymphadenopathy. Further evaluation with CT of the chest with contrast, with clinically feasible, is recommended. Electronically Signed   By: Ted Mcalpine M.D.   On: 10/05/2020 14:51   CT CHEST W CONTRAST  Result Date: 10/05/2020 CLINICAL DATA:  Abnormal chest x-ray. EXAM: CT CHEST WITH CONTRAST TECHNIQUE: Multidetector CT imaging of the chest was performed during intravenous contrast administration. CONTRAST:  75mL  OMNIPAQUE IOHEXOL 300 MG/ML  SOLN COMPARISON:  Same day chest radiograph. FINDINGS: Cardiovascular: Scattered atherosclerotic calcifications throughout the aorta. Predominately LEFT-sided coronary artery atherosclerotic calcifications. Asymmetric enlargement of the main pulmonary artery in relation to the ascending thoracic aorta. No pericardial effusion. Heart is at the upper limits of normal in size. Mediastinum/Nodes: No enlarged mediastinal, hilar, or axillary lymph nodes. Thyroid gland, trachea, and esophagus demonstrate no significant findings. Lungs/Pleura: Bibasilar dependent consolidative opacities most consistent with atelectasis. No pleural effusion or pneumothorax. LEFT lower lobe pulmonary nodule measures 6 mm (series 2, image 61). Upper Abdomen: No acute abnormality.  Cholelithiasis. Musculoskeletal: Remote rib fractures. Age-indeterminate rib fracture of the RIGHT lateral third and fourth rib. Mild degenerative changes of the thoracic spine. 1 IMPRESSION: 1. Age-indeterminate rib fracture of the RIGHT lateral third and fourth rib. Correlate for point tenderness. 2. Asymmetric enlargement of the main pulmonary artery in relation to the ascending thoracic aorta, which can be seen with pulmonary arterial hypertension. This may account for the contour abnormality on chest radiograph. 3. 6 mm  LEFT lower lobe pulmonary nodule. Non-contrast chest CT at 6-12 months is recommended. If the nodule is stable at time of repeat CT, then future CT at 18-24 months (from today's scan) is considered optional for low-risk patients, but is recommended for high-risk patients. This recommendation follows the consensus statement: Guidelines for Management of Incidental Pulmonary Nodules Detected on CT Images: From the Fleischner Society 2017; Radiology 2017; 284:228-243. Aortic Atherosclerosis (ICD10-I70.0). Electronically Signed   By: Meda Klinefelter MD   On: 10/05/2020 17:34   DG C-Arm 1-60 Min  Result Date:  10/06/2020 CLINICAL DATA:  Right femur fracture, ORIF EXAM: DG C-ARM 1-60 MIN; RIGHT FEMUR 2 VIEWS COMPARISON:  COMPARISON Hip radiograph 10/05/2020 FINDINGS: Fluoroscopic spot images demonstrate femoral IM nail and hip screw fixation of a intertrochanteric fracture. Separate lesser trochanteric fragment. Single interlocking screw distally in the IM nail. No complicating feature. IMPRESSION: 1. ORIF of a right intertrochanteric fracture. Electronically Signed   By: Gaylyn Rong M.D.   On: 10/06/2020 15:21   DG Hip Unilat W or Wo Pelvis 2-3 Views Right  Result Date: 10/05/2020 CLINICAL DATA:  Post fall with rotation and shortening of the right leg. EXAM: DG HIP (WITH OR WITHOUT PELVIS) 2-3V RIGHT COMPARISON:  None. FINDINGS: There is a comminuted impacted fracture of the intratrochanteric portion of the right femur with butterfly fragment consisting of the lesser trochanter. Superior displacement of the distal fracture fragment. The right femoral head is normally located within the acetabulum. Mild osteoarthritic changes of the left hip. IMPRESSION: Comminuted impacted fracture of the intratrochanteric portion of the right femur. Electronically Signed   By: Ted Mcalpine M.D.   On: 10/05/2020 14:49   DG FEMUR, MIN 2 VIEWS RIGHT  Result Date: 10/06/2020 CLINICAL DATA:  Right femur fracture, ORIF EXAM: DG C-ARM 1-60 MIN; RIGHT FEMUR 2 VIEWS COMPARISON:  COMPARISON Hip radiograph 10/05/2020 FINDINGS: Fluoroscopic spot images demonstrate femoral IM nail and hip screw fixation of a intertrochanteric fracture. Separate lesser trochanteric fragment. Single interlocking screw distally in the IM nail. No complicating feature. IMPRESSION: 1. ORIF of a right intertrochanteric fracture. Electronically Signed   By: Gaylyn Rong M.D.   On: 10/06/2020 15:21   DG FEMUR, MIN 2 VIEWS RIGHT  Result Date: 10/06/2020 CLINICAL DATA:  Right femur IM nail EXAM: RIGHT FEMUR 2 VIEWS COMPARISON:  Hip  radiograph 10/05/2020 FINDINGS: Right femoral IM nail with hip screw noted, the intramedullary nail is long stem extending to the distal metadiaphysis, with a single distal interlocking screw. Near anatomic alignment of the intertrochanteric fracture aside from the mildly displaced lesser trochanteric fragment. IMPRESSION: 1. Right femoral IM nail placement for ORIF of intertrochanteric fracture. Near anatomic alignment aside from the mildly displaced lesser trochanteric fragment. Electronically Signed   By: Gaylyn Rong M.D.   On: 10/06/2020 15:19    Scheduled Meds: . amLODipine  5 mg Oral Daily  . atorvastatin  10 mg Oral QHS  . cholecalciferol  1,000 Units Oral Daily  . docusate sodium  100 mg Oral BID  . donepezil  5 mg Oral QHS  . enoxaparin (LOVENOX) injection  40 mg Subcutaneous Q24H  . loratadine  10 mg Oral Daily  . memantine  5 mg Oral BID  . multivitamin with minerals  1 tablet Oral Daily  . mupirocin ointment   Nasal BID  . senna  1 tablet Oral BID  . traMADol  50 mg Oral Q6H   Continuous Infusions: . sodium chloride 75 mL/hr (10/06/20 1505)  . methocarbamol (  ROBAXIN) IV       LOS: 2 days   Time spent: 35  minutes  Elloise Roark Estill Cotta Makhi Muzquiz, MD [facility]  If 7PM-7AM, please contact night-coverage [email or web address] 10/07/2020, 1:50 PM

## 2020-10-08 ENCOUNTER — Encounter: Payer: Self-pay | Admitting: Orthopedic Surgery

## 2020-10-08 DIAGNOSIS — S72141A Displaced intertrochanteric fracture of right femur, initial encounter for closed fracture: Secondary | ICD-10-CM | POA: Diagnosis not present

## 2020-10-08 LAB — CBC
HCT: 22.7 % — ABNORMAL LOW (ref 36.0–46.0)
HCT: 21.1 % — ABNORMAL LOW (ref 36.0–46.0)
Hemoglobin: 7.5 g/dL — ABNORMAL LOW (ref 12.0–15.0)
Hemoglobin: 6.9 g/dL — ABNORMAL LOW (ref 12.0–15.0)
MCH: 31.4 pg (ref 26.0–34.0)
MCH: 30.9 pg (ref 26.0–34.0)
MCHC: 33 g/dL (ref 30.0–36.0)
MCHC: 32.7 g/dL (ref 30.0–36.0)
MCV: 95 fL (ref 80.0–100.0)
MCV: 94.6 fL (ref 80.0–100.0)
Platelets: 125 10*3/uL — ABNORMAL LOW (ref 150–400)
Platelets: 121 10*3/uL — ABNORMAL LOW (ref 150–400)
RBC: 2.39 MIL/uL — ABNORMAL LOW (ref 3.87–5.11)
RBC: 2.23 MIL/uL — ABNORMAL LOW (ref 3.87–5.11)
RDW: 12.8 % (ref 11.5–15.5)
RDW: 12.9 % (ref 11.5–15.5)
WBC: 8.3 10*3/uL (ref 4.0–10.5)
WBC: 8.8 10*3/uL (ref 4.0–10.5)
nRBC: 0 % (ref 0.0–0.2)
nRBC: 0 % (ref 0.0–0.2)

## 2020-10-08 LAB — BASIC METABOLIC PANEL
Anion gap: 5 (ref 5–15)
BUN: 15 mg/dL (ref 8–23)
CO2: 25 mmol/L (ref 22–32)
Calcium: 7.8 mg/dL — ABNORMAL LOW (ref 8.9–10.3)
Chloride: 108 mmol/L (ref 98–111)
Creatinine, Ser: 0.55 mg/dL (ref 0.44–1.00)
GFR, Estimated: >60 mL/min (ref >60)
Glucose, Bld: 93 mg/dL (ref 70–99)
Potassium: 3.6 mmol/L (ref 3.5–5.1)
Sodium: 138 mmol/L (ref 135–145)

## 2020-10-08 LAB — PREPARE RBC (CROSSMATCH)

## 2020-10-08 LAB — TYPE AND SCREEN
ABO/RH(D): A POS
Antibody Screen: NEGATIVE

## 2020-10-08 LAB — ABO/RH: ABO/RH(D): A POS

## 2020-10-08 LAB — HEMOGLOBIN AND HEMATOCRIT, BLOOD
HCT: 24.3 % — ABNORMAL LOW (ref 36.0–46.0)
Hemoglobin: 8.2 g/dL — ABNORMAL LOW (ref 12.0–15.0)

## 2020-10-08 MED ORDER — CHLORHEXIDINE GLUCONATE CLOTH 2 % EX PADS
6.0000 | MEDICATED_PAD | Freq: Every day | CUTANEOUS | Status: DC
  Administered 2020-10-09 – 2020-10-10 (×2): 6 via TOPICAL

## 2020-10-08 MED ORDER — SODIUM CHLORIDE 0.9% IV SOLUTION: Freq: Once | INTRAVENOUS | Status: AC

## 2020-10-08 MED ORDER — TAMSULOSIN HCL 0.4 MG PO CAPS
0.4000 mg | ORAL_CAPSULE | Freq: Every day | ORAL | Status: DC
  Administered 2020-10-08 – 2020-10-11 (×4): 0.4 mg via ORAL
  Filled 2020-10-08 (×4): qty 1

## 2020-10-08 MED ORDER — ACETAMINOPHEN 325 MG PO TABS
650.0000 mg | ORAL_TABLET | Freq: Once | ORAL | Status: AC
  Administered 2020-10-08: 650 mg via ORAL
  Filled 2020-10-08: qty 2

## 2020-10-08 NOTE — Progress Notes (Signed)
Physical Therapy Treatment Patient Details Name: Cheyenne Parker MRN: [id number] DOB: 10-25-1946 Today's Date: 10/08/2020    History of Present Illness Pt is a 74 y.o. female presenting to hospital 11/13 s/p mechanical fall walking down basement steps.  Pt s/p 11/14 intramedullary fixation of R comminuted intertrochanteric hip fx.  Imaging also showing age-indeterminate rib fx of R lateral 3rd and 4th ribs.  PMH includes anemia, IFG, obesity, osteoporosis, dementia.    PT Comments    Pt resting in bed upon PT arrival; pt pre-medicated with pain meds for therapy session.  Improvement in ability to participate in R LE ex's noted.  1-2 assist with bed mobility.  Unable to stand up to RW with 1 assist and able to get 1/4th stand with 2 assist (limited d/t R hip/thigh pain).  Nurse present assisting pt with bath end of session.  Pt's Hgb noted to be 7.5 prior to PT session (recent lab draw not back yet) and post session noted to be 6.9.  Will monitor pt's status and progress as appropriate.   Follow Up Recommendations  SNF     Equipment Recommendations  Rolling walker with 5" wheels;3in1 (PT);Wheelchair (measurements PT);Wheelchair cushion (measurements PT) (youth sized)    Recommendations for Other Services OT consult     Precautions / Restrictions Precautions Precautions: Fall Restrictions Weight Bearing Restrictions: Yes RLE Weight Bearing: Weight bearing as tolerated    Mobility  Bed Mobility Overal bed mobility: Needs Assistance Bed Mobility: Supine to Sit;Sit to Supine     Supine to sit: Max assist;HOB elevated (pt tending to resist (with trunk) sitting up requiring extra time and cueing to safely perform) Sit to supine: Mod assist;Max assist;+2 for physical assistance;HOB elevated   General bed mobility comments: assist for trunk and B LE's; 2 assist to lay down in bed (d/t R hip/thigh pain) and to scoot up in bed using bed sheet  Transfers Overall transfer level: Needs  assistance Equipment used: Rolling walker (2 wheeled) Transfers: Sit to/from Stand Sit to Stand: +2 physical assistance         General transfer comment: unable to stand with 1 assist; 1/4 stand with 2 assist (x1 trial); R knee blocked; vc's and tactile cues for B UE and B LE placement and use of walker; deferred further standing d/t increased R hip/thigh pain with activity  Ambulation/Gait Ambulation/Gait assistance:  (unable to fully stand to attempt)               Stairs             Wheelchair Mobility    Modified Rankin (Stroke Patients Only)       Balance Overall balance assessment: Needs assistance Sitting-balance support: Single extremity supported;Feet supported Sitting balance-Leahy Scale: Fair Sitting balance - Comments: steady sitting static with at least single UE support                                    Cognition Arousal/Alertness: Awake/alert Behavior During Therapy: WFL for tasks assessed/performed Overall Cognitive Status: Within Functional Limits for tasks assessed                                        Exercises Total Joint Exercises Ankle Circles/Pumps: AROM;Strengthening;Both;10 reps;Supine Quad Sets: AROM;Strengthening;Both;10 reps;Supine Heel Slides: AAROM;Strengthening;Right;10 reps;Supine (small ROM d/t R hip/thigh pain)  Hip ABduction/ADduction: AAROM;Strengthening;Right;10 reps;Supine (small ROM d/t R hip/thigh pain)    General Comments General comments (skin integrity, edema, etc.): minimal drainage noted proximal R hip/thigh dressing.  Nursing cleared pt for participation in physical therapy.  Pt agreeable to PT session.      Pertinent Vitals/Pain Pain Assessment: 0-10 Pain Score: 5  (pain increased with activity but decreased with rest) Pain Location: R hip/thigh Pain Descriptors / Indicators: Aching;Operative site guarding;Discomfort;Grimacing;Guarding;Sore;Tender Pain Intervention(s):  Limited activity within patient's tolerance;Monitored during session;Premedicated before session;Repositioned  Vitals (HR and O2 on 2 L via nasal cannula) stable and WFL throughout treatment session.    Home Living                      Prior Function            PT Goals (current goals can now be found in the care plan section) Acute Rehab PT Goals Patient Stated Goal: to improve mobility and pain PT Goal Formulation: With patient Time For Goal Achievement: 10/21/20 Potential to Achieve Goals: Good Progress towards PT goals: Progressing toward goals    Frequency    BID      PT Plan Current plan remains appropriate    Co-evaluation              AM-PAC PT "6 Clicks" Mobility   Outcome Measure  Help needed turning from your back to your side while in a flat bed without using bedrails?: A Lot Help needed moving from lying on your back to sitting on the side of a flat bed without using bedrails?: A Lot Help needed moving to and from a bed to a chair (including a wheelchair)?: Total Help needed standing up from a chair using your arms (e.g., wheelchair or bedside chair)?: Total Help needed to walk in hospital room?: Total Help needed climbing 3-5 steps with a railing? : Total 6 Click Score: 8    End of Session Equipment Utilized During Treatment: Gait belt;Oxygen (2 L O2 via nasal cannula) Activity Tolerance: Patient limited by pain Patient left: in bed;with SCD's reapplied;Other (comment);with nursing/sitter in room (nurse present to assist pt with bathing and reported he would give pt phone, call light, and set bed alarm when they are done working with pt) Nurse Communication: Mobility status;Precautions;Weight bearing status;Other (comment) (pt's pain) PT Visit Diagnosis: Other abnormalities of gait and mobility (R26.89);History of falling (Z91.81);Muscle weakness (generalized) (M62.81);Difficulty in walking, not elsewhere classified (R26.2);Pain Pain -  Right/Left: Right Pain - part of body: Hip     Time: 1035-1105 PT Time Calculation (min) (ACUTE ONLY): 30 min  Charges:  $Therapeutic Exercise: 8-22 mins $Therapeutic Activity: 8-22 mins                    Hendricks Limes, PT 10/08/20, 11:36 AM

## 2020-10-08 NOTE — Progress Notes (Signed)
Subjective:  POD #2 s/p intramedullary fixation for right intertrochanteric hip fracture.   Patient reports right hip pain as mild to moderate.  Patient seen resting in bed.  She is receiving a unit of PRBCs for hemoglobin of 6.9 today.  Objective:   VITALS:   Vitals:   10/08/20 1547 10/08/20 1628 10/08/20 1827 10/08/20 1828  BP: (!) 138/55 126/71 (!) 129/53 (!) 126/50  Pulse: 82 81 77 76  Resp:  17  19  Temp: 98.4 F (36.9 C) 98.8 F (37.1 C)  98.9 F (37.2 C)  TempSrc: Oral Oral  Oral  SpO2: 99% 98% 99% 99%  Weight:      Height:        PHYSICAL EXAM: Right lower extremity Neurovascular intact Sensation intact distally Intact pulses distally Dorsiflexion/Plantar flexion intact Incision: dressing C/D/I No cellulitis present Compartment soft  LABS  Results for orders placed or performed during the hospital encounter of 10/05/20 (from the past 24 hour(s))  CBC     Status: Abnormal   Collection Time: 10/08/20  4:43 AM  Result Value Ref Range   WBC 8.3 4.0 - 10.5 K/uL   RBC 2.39 (L) 3.87 - 5.11 MIL/uL   Hemoglobin 7.5 (L) 12.0 - 15.0 g/dL   HCT 34.7 (L) 36 - 46 %   MCV 95.0 80.0 - 100.0 fL   MCH 31.4 26.0 - 34.0 pg   MCHC 33.0 30.0 - 36.0 g/dL   RDW 42.5 95.6 - 38.7 %   Platelets 125 (L) 150 - 400 K/uL   nRBC 0.0 0.0 - 0.2 %  Basic metabolic panel     Status: Abnormal   Collection Time: 10/08/20  4:43 AM  Result Value Ref Range   Sodium 138 135 - 145 mmol/L   Potassium 3.6 3.5 - 5.1 mmol/L   Chloride 108 98 - 111 mmol/L   CO2 25 22 - 32 mmol/L   Glucose, Bld 93 70 - 99 mg/dL   BUN 15 8 - 23 mg/dL   Creatinine, Ser 5.64 0.44 - 1.00 mg/dL   Calcium 7.8 (L) 8.9 - 10.3 mg/dL   GFR, Estimated >33 >29 mL/min   Anion gap 5 5 - 15  ABO/Rh     Status: None   Collection Time: 10/08/20  4:43 AM  Result Value Ref Range   ABO/RH(D)      A POS Performed at [facility], [address] [place], [place] [address]   CBC     Status: Abnormal   Collection  Time: 10/08/20 10:18 AM  Result Value Ref Range   WBC 8.8 4.0 - 10.5 K/uL   RBC 2.23 (L) 3.87 - 5.11 MIL/uL   Hemoglobin 6.9 (L) 12.0 - 15.0 g/dL   HCT 16.6 (L) 36 - 46 %   MCV 94.6 80.0 - 100.0 fL   MCH 30.9 26.0 - 34.0 pg   MCHC 32.7 30.0 - 36.0 g/dL   RDW 06.3 01.6 - 01.0 %   Platelets 121 (L) 150 - 400 K/uL   nRBC 0.0 0.0 - 0.2 %  Prepare RBC (crossmatch)     Status: None   Collection Time: 10/08/20 11:21 AM  Result Value Ref Range   Order Confirmation      ORDER PROCESSED BY BLOOD BANK Performed at [facility], [address], [place], [place] [address]   Type and screen [facility]     Status: None   Collection Time: 10/08/20 12:13 PM  Result Value Ref  Range   ABO/RH(D) A POS    Antibody Screen NEG    Sample Expiration      10/11/2020,2359 Performed at [facility], [address] [place], [place] [address]     No results found.  Assessment/Plan: 2 Days Post-Op   Principal Problem:   Intertrochanteric fracture of right femur, closed, initial encounter [facility]) Active Problems:   Obesity   Essential hypertension   Dementia without behavioral disturbance (HCC)  Patient receiving a unit of PRBCs for hemoglobin of 6.9.  Physical therapy was deferred due to patient's transfusion.  Reinitiate physical and occupational therapy tomorrow.  Patient on Lovenox for DVT prophylaxis.  Check CBC in the morning.    Juanell Fairly , MD 10/08/2020, 7:25 PM

## 2020-10-08 NOTE — Progress Notes (Signed)
PROGRESS NOTE    Cheyenne Parker  ZOX:[id number] DOB: 06/12/1946 DOA: 10/05/2020 PCP: Corky DownsMasoud, Javed, MD   Brief Narrative:  Patient is 74 year old female with past medical history of hypertension, hyperlipidemia, dementia, obesity with BMI of 30 presents to emergency department for evaluation of right hip pain after a mechanical fall at home.  ED course: Upon arrival: Patient's vital signs are stable, initial lab status CBC, BMP: WNL, COVID-19 negative.  X-ray of right hip shows comminuted impacted fracture of intertrochanteric portion of right femur.  Orthopedic surgery consulted.  Patient admitted for further evaluation and management of her right femur fracture.  Assessment & Plan:   Right intertrochanteric femur fracture: -Status post mechanical fall. -Status post intramedullary fixation of right comminuted intertrochanteric hip fracture, POD: 2 -Continue with pain medication-tramadol, morphine, Robaxin, Norco as needed. -Monitor H&H and vitals closely. -PT recommended SNF -Continue as needed pain medications.  Acute anemia: Blood loss versus hemodilution? -H&H dropped from 12.0-8.6-7.5-6.9 this morning.  -POC occult blood pending.  Will transfuse 1 unit -Continue to monitor H&H closely.  Urinary retention: -Patient had urinary retention issues prior to the surgery. -Had Foley placed prior to the surgery which DC'd on 11/15. -She continues to have problem with urinary retention.  I & O cath done multiple times. -trial of flomax-if no improvement then place foley  Hypertension: Stable -Continue amlodipine.  Dementia: -Continue  Aricept and Namenda  Hyperlipidemia: Continue statin.  Obesity with BMI of 30: -Diet modification/exercise and weight loss recommended.  Constipation: Likely in the setting of pain medications -Continue with MiraLAX and stool softeners. -Is passing gas however has not had bowel movement yet.  DVT prophylaxis: SCD/Lovenox code Status: Full  code Family Communication:  None present at bedside.  Plan of care discussed with patient in length and she verbalized understanding and agreed with it. Disposition Plan: Home versus SNF  Consultants:   Orthopedic surgery  Procedures:  Intramedullary fixation of right intertrochanteric hip fracture  Antimicrobials:   Cefazolin  Status is: Inpatient  Dispo: The patient is from: Home              Anticipated d/c is to: SNF              Anticipated d/c date is: 2 days              Patient currently is not medically stable to d/c.   Subjective: Patient seen and examined.  Appears weak, dehydrated.  Tells me that she is not feeling well overall, complaining of right leg pain and generalized weakness.  Has been passing gas however has not had a bowel movement yet.  Objective: Vitals:   10/07/20 2317 10/08/20 0331 10/08/20 0815 10/08/20 1138  BP: (!) 121/46 (!) 134/55 (!) 122/53 (!) 111/36  Pulse: 79 81 88 75  Resp: 14 16 19    Temp: 98.6 F (37 C) 98.7 F (37.1 C) 98.9 F (37.2 C)   TempSrc:   Oral   SpO2: 90% 91% 97% 99%  Weight:      Height:        Intake/Output Summary (Last 24 hours) at 10/08/2020 1141 Last data filed at 10/08/2020 0400 Gross per 24 hour  Intake --  Output 800 ml  Net -800 ml   Filed Weights   10/05/20 1406  Weight: 74.4 kg    Examination:  General exam: Appears calm and comfortable, on room air, obese, appears dehydrated, weak and pale. Respiratory system: Clear to auscultation. Respiratory effort normal. Cardiovascular system:  S1 & S2 heard, RRR. No JVD, murmurs, rubs, gallops or clicks. No pedal edema. Gastrointestinal system: Abdomen is nondistended, soft and nontender. No organomegaly or masses felt. Normal bowel sounds heard. Central nervous system: Alert and oriented. No focal neurological deficits. Extremities: Right leg: Skin: No rashes, lesions or ulcers. Psychiatry: Judgement and insight appear normal. Mood & affect  appropriate.   Data Reviewed: I have personally reviewed following labs and imaging studies  CBC: Recent Labs  Lab 10/05/20 1405 10/06/20 0414 10/07/20 0449 10/08/20 0443 10/08/20 1018  WBC 9.9 10.0 9.1 8.3 8.8  NEUTROABS 6.2  --   --   --   --   HGB 12.0 10.0* 8.6* 7.5* 6.9*  HCT 36.3 30.6* 25.1* 22.7* 21.1*  MCV 92.4 93.9 94.0 95.0 94.6  PLT 199 186 141* 125* 121*   Basic Metabolic Panel: Recent Labs  Lab 10/05/20 1405 10/06/20 0414 10/07/20 0449 10/08/20 0443  NA 136 136 139 138  K 3.7 4.7 3.7 3.6  CL 102 105 105 108  CO2 24 27 25 25   GLUCOSE 116* 135* 127* 93  BUN 17 18 19 15   CREATININE 0.93 0.84 0.76 0.55  CALCIUM 8.9 8.7* 8.4* 7.8*   GFR: Estimated Creatinine Clearance: 59.1 mL/min (by C-G formula based on SCr of 0.55 mg/dL). Liver Function Tests: No results for input(s): AST, ALT, ALKPHOS, BILITOT, PROT, ALBUMIN in the last 168 hours. No results for input(s): LIPASE, AMYLASE in the last 168 hours. No results for input(s): AMMONIA in the last 168 hours. Coagulation Profile: Recent Labs  Lab 10/05/20 1405  INR 1.0   Cardiac Enzymes: No results for input(s): CKTOTAL, CKMB, CKMBINDEX, TROPONINI in the last 168 hours. BNP (last 3 results) No results for input(s): PROBNP in the last 8760 hours. HbA1C: No results for input(s): HGBA1C in the last 72 hours. CBG: No results for input(s): GLUCAP in the last 168 hours. Lipid Profile: No results for input(s): CHOL, HDL, LDLCALC, TRIG, CHOLHDL, LDLDIRECT in the last 72 hours. Thyroid Function Tests: No results for input(s): TSH, T4TOTAL, FREET4, T3FREE, THYROIDAB in the last 72 hours. Anemia Panel: No results for input(s): VITAMINB12, FOLATE, FERRITIN, TIBC, IRON, RETICCTPCT in the last 72 hours. Sepsis Labs: No results for input(s): PROCALCITON, LATICACIDVEN in the last 168 hours.  Recent Results (from the past 240 hour(s))  Respiratory Panel by RT PCR (Flu A&B, Covid) - Nasopharyngeal Swab     Status:  None   Collection Time: 10/05/20  2:56 PM   Specimen: Nasopharyngeal Swab  Result Value Ref Range Status   SARS Coronavirus 2 by RT PCR NEGATIVE NEGATIVE Final    Comment: (NOTE) SARS-CoV-2 target nucleic acids are NOT DETECTED.  The SARS-CoV-2 RNA is generally detectable in upper respiratoy specimens during the acute phase of infection. The lowest concentration of SARS-CoV-2 viral copies this assay can detect is 131 copies/mL. A negative result does not preclude SARS-Cov-2 infection and should not be used as the sole basis for treatment or other patient management decisions. A negative result may occur with  improper specimen collection/handling, submission of specimen other than nasopharyngeal swab, presence of viral mutation(s) within the areas targeted by this assay, and inadequate number of viral copies (<131 copies/mL). A negative result must be combined with clinical observations, patient history, and epidemiological information. The expected result is Negative.  Fact Sheet for Patients:  10/07/20  Fact Sheet for Healthcare Providers:  10/07/20  This test is no t yet approved or cleared by the https://www.moore.com/ and  has been authorized for detection and/or diagnosis of SARS-CoV-2 by FDA under an Emergency Use Authorization (EUA). This EUA will remain  in effect (meaning this test can be used) for the duration of the COVID-19 declaration under Section 564(b)(1) of the Act, 21 U.S.C. section 360bbb-3(b)(1), unless the authorization is terminated or revoked sooner.     Influenza A by PCR NEGATIVE NEGATIVE Final   Influenza B by PCR NEGATIVE NEGATIVE Final    Comment: (NOTE) The Xpert Xpress SARS-CoV-2/FLU/RSV assay is intended as an aid in  the diagnosis of influenza from Nasopharyngeal swab specimens and  should not be used as a sole basis for treatment. Nasal washings and  aspirates are unacceptable for  Xpert Xpress SARS-CoV-2/FLU/RSV  testing.  Fact Sheet for Patients: https://www.moore.com/  Fact Sheet for Healthcare Providers: https://www.young.biz/  This test is not yet approved or cleared by the [place] FDA and  has been authorized for detection and/or diagnosis of SARS-CoV-2 by  FDA under an Emergency Use Authorization (EUA). This EUA will remain  in effect (meaning this test can be used) for the duration of the  Covid-19 declaration under Section 564(b)(1) of the Act, 21  U.S.C. section 360bbb-3(b)(1), unless the authorization is  terminated or revoked. Performed at [facility], [address], [place], [place] [address]   Surgical pcr screen     Status: Abnormal   Collection Time: 10/06/20  9:50 AM   Specimen: Nasal Mucosa; Nasal Swab  Result Value Ref Range Status   MRSA, PCR NEGATIVE NEGATIVE Final   Staphylococcus aureus POSITIVE (A) NEGATIVE Final    Comment: (NOTE) The Xpert SA Assay (FDA approved for NASAL specimens in patients 61 years of age and older), is one component of a comprehensive surveillance program. It is not intended to diagnose infection nor to guide or monitor treatment. Performed at [facility], [address], [place], [place] [address]       Radiology Studies: DG C-Arm 1-60 Min  Result Date: 10/06/2020 CLINICAL DATA:  Right femur fracture, ORIF EXAM: DG C-ARM 1-60 MIN; RIGHT FEMUR 2 VIEWS COMPARISON:  COMPARISON Hip radiograph 10/05/2020 FINDINGS: Fluoroscopic spot images demonstrate femoral IM nail and hip screw fixation of a intertrochanteric fracture. Separate lesser trochanteric fragment. Single interlocking screw distally in the IM nail. No complicating feature. IMPRESSION: 1. ORIF of a right intertrochanteric fracture. Electronically Signed   By: Gaylyn Rong M.D.   On: 10/06/2020 15:21   DG FEMUR, MIN 2 VIEWS RIGHT  Result Date: 10/06/2020 CLINICAL DATA:  Right  femur fracture, ORIF EXAM: DG C-ARM 1-60 MIN; RIGHT FEMUR 2 VIEWS COMPARISON:  COMPARISON Hip radiograph 10/05/2020 FINDINGS: Fluoroscopic spot images demonstrate femoral IM nail and hip screw fixation of a intertrochanteric fracture. Separate lesser trochanteric fragment. Single interlocking screw distally in the IM nail. No complicating feature. IMPRESSION: 1. ORIF of a right intertrochanteric fracture. Electronically Signed   By: Gaylyn Rong M.D.   On: 10/06/2020 15:21   DG FEMUR, MIN 2 VIEWS RIGHT  Result Date: 10/06/2020 CLINICAL DATA:  Right femur IM nail EXAM: RIGHT FEMUR 2 VIEWS COMPARISON:  Hip radiograph 10/05/2020 FINDINGS: Right femoral IM nail with hip screw noted, the intramedullary nail is long stem extending to the distal metadiaphysis, with a single distal interlocking screw. Near anatomic alignment of the intertrochanteric fracture aside from the mildly displaced lesser trochanteric fragment. IMPRESSION: 1. Right femoral IM nail placement for ORIF of intertrochanteric fracture. Near anatomic alignment aside from the mildly displaced lesser trochanteric fragment. Electronically  Signed   By: Gaylyn Rong M.D.   On: 10/06/2020 15:19    Scheduled Meds: . sodium chloride   Intravenous Once  . acetaminophen  650 mg Oral Once  . amLODipine  5 mg Oral Daily  . atorvastatin  10 mg Oral QHS  . cholecalciferol  1,000 Units Oral Daily  . docusate sodium  100 mg Oral BID  . donepezil  5 mg Oral QHS  . enoxaparin (LOVENOX) injection  40 mg Subcutaneous Q24H  . feeding supplement  237 mL Oral BID BM  . loratadine  10 mg Oral Daily  . memantine  5 mg Oral BID  . multivitamin with minerals  1 tablet Oral Daily  . mupirocin ointment   Nasal BID  . senna  1 tablet Oral BID  . traMADol  50 mg Oral Q6H   Continuous Infusions: . sodium chloride 75 mL/hr (10/06/20 1505)  . methocarbamol (ROBAXIN) IV       LOS: 3 days   Time spent: 35  minutes  Yaron Grasse Estill Cotta, MD [facility]  If 7PM-7AM, please contact night-coverage [email or web address] 10/08/2020, 11:41 AM

## 2020-10-08 NOTE — Progress Notes (Signed)
PT Cancellation Note  Patient Details Name: Cheyenne Parker MRN: [id number] DOB: 1946-01-31   Cancelled Treatment:    Reason Eval/Treat Not Completed: Other (comment).  Chart reviewed.  Last noted Hgb trending down to 6.9 and pt still pending blood transfusion today.  Per PT guidelines for low Hgb, will hold PT at this time and re-attempt PT treatment session tomorrow (discussed with pt's nurse).  Hendricks Limes, PT 10/08/20, 2:54 PM

## 2020-10-08 NOTE — Progress Notes (Signed)
OT Cancellation Note  Patient Details Name: PERL FOLMAR MRN: [id number] DOB: 1946/05/06   Cancelled Treatment:    Reason Eval/Treat Not Completed: Other (comment)  RN prepping to give pt blood transfusion d/t Hgb of 6.9. Will f/u at later date/time as able/pt available for OT treatment. Thank you.  Rejeana Brock, MS, OTR/L ascom [phone] 10/08/20, 1:49 PM

## 2020-10-08 NOTE — TOC Progression Note (Signed)
Transition of Care [facility]) - Progression Note    Patient Details  Name: Cheyenne Parker MRN: [id number] Date of Birth: [birth year]  Transition of Care [facility]) CM/SW [place], RN Phone Number: 10/08/2020, 2:05 PM  Clinical Narrative:   RNCM met with patient at bedside to discuss bed offers. Bed offers for [facility], [facility] and [facility] were presented as well as reviewing Medicare.gov ratings for facilities. Patient chooses [facility]. Insurance authorization will be started once patient is medically ready.          Expected Discharge Plan and Services                                                 Social Determinants of Health (SDOH) Interventions    Readmission Risk Interventions No flowsheet data found.

## 2020-10-09 DIAGNOSIS — S72141A Displaced intertrochanteric fracture of right femur, initial encounter for closed fracture: Secondary | ICD-10-CM | POA: Diagnosis not present

## 2020-10-09 LAB — CBC
HCT: 25 % — ABNORMAL LOW (ref 36.0–46.0)
Hemoglobin: 8.4 g/dL — ABNORMAL LOW (ref 12.0–15.0)
MCH: 30.4 pg (ref 26.0–34.0)
MCHC: 33.6 g/dL (ref 30.0–36.0)
MCV: 90.6 fL (ref 80.0–100.0)
Platelets: 128 10*3/uL — ABNORMAL LOW (ref 150–400)
RBC: 2.76 MIL/uL — ABNORMAL LOW (ref 3.87–5.11)
RDW: 13.8 % (ref 11.5–15.5)
WBC: 8 10*3/uL (ref 4.0–10.5)
nRBC: 0 % (ref 0.0–0.2)

## 2020-10-09 LAB — TYPE AND SCREEN
ABO/RH(D): A POS
Antibody Screen: NEGATIVE
Unit division: 0

## 2020-10-09 LAB — BASIC METABOLIC PANEL
Anion gap: 8 (ref 5–15)
BUN: 14 mg/dL (ref 8–23)
CO2: 27 mmol/L (ref 22–32)
Calcium: 8.1 mg/dL — ABNORMAL LOW (ref 8.9–10.3)
Chloride: 105 mmol/L (ref 98–111)
Creatinine, Ser: 0.62 mg/dL (ref 0.44–1.00)
GFR, Estimated: >60 mL/min (ref >60)
Glucose, Bld: 102 mg/dL — ABNORMAL HIGH (ref 70–99)
Potassium: 3.8 mmol/L (ref 3.5–5.1)
Sodium: 140 mmol/L (ref 135–145)

## 2020-10-09 LAB — BPAM RBC
Blood Product Expiration Date: 202112122359
ISSUE DATE / TIME: 202111161556
Unit Type and Rh: 6200

## 2020-10-09 MED ORDER — SENNOSIDES-DOCUSATE SODIUM 8.6-50 MG PO TABS
1.0000 | ORAL_TABLET | Freq: Two times a day (BID) | ORAL | Status: DC
  Administered 2020-10-09 – 2020-10-11 (×6): 1 via ORAL
  Filled 2020-10-09 (×5): qty 1

## 2020-10-09 MED ORDER — POLYETHYLENE GLYCOL 3350 17 G PO PACK
17.0000 g | PACK | Freq: Every day | ORAL | Status: DC
  Administered 2020-10-09 – 2020-10-10 (×2): 17 g via ORAL
  Filled 2020-10-09 (×2): qty 1

## 2020-10-09 MED ORDER — OXYCODONE HCL 5 MG PO TABS
5.0000 mg | ORAL_TABLET | ORAL | Status: DC | PRN
  Administered 2020-10-09 – 2020-10-11 (×5): 5 mg via ORAL
  Filled 2020-10-09 (×5): qty 1

## 2020-10-09 MED ORDER — MORPHINE SULFATE (PF) 2 MG/ML IV SOLN
2.0000 mg | INTRAVENOUS | Status: DC | PRN
  Administered 2020-10-09: 2 mg via INTRAVENOUS
  Filled 2020-10-09: qty 1

## 2020-10-09 MED ORDER — KETOROLAC TROMETHAMINE 15 MG/ML IJ SOLN
15.0000 mg | Freq: Four times a day (QID) | INTRAMUSCULAR | Status: DC
  Administered 2020-10-09 – 2020-10-11 (×9): 15 mg via INTRAVENOUS
  Filled 2020-10-09 (×9): qty 1

## 2020-10-09 NOTE — TOC Progression Note (Signed)
Transition of Care [facility]) - Progression Note    Patient Details  Name: Cheyenne Parker MRN: [id number] Date of Birth: 1945-11-24  Transition of Care [facility]) CM/SW Contact  Trenton Founds, RN Phone Number: 10/09/2020, 2:03 PM  Clinical Narrative:   RNCM initiated insurance authorization for [place] health through their portal with reference number of [id number].          Expected Discharge Plan and Services                                                 Social Determinants of Health (SDOH) Interventions    Readmission Risk Interventions No flowsheet data found.

## 2020-10-09 NOTE — Progress Notes (Signed)
  Subjective:  POD #3 s/p intramedullary fixation for right intertrochanteric hip fracture.  Hemoglobin and hematocrit have improved after a unit of PRBCs transfused yesterday.   Patient reports right hip pain as mild to moderate.  Patient is out of bed to a chair.  Her husband is at the bedside.  Objective:   VITALS:   Vitals:   10/09/20 0005 10/09/20 0331 10/09/20 0756 10/09/20 1125  BP: (!) 120/47 (!) 115/48 (!) 122/49 (!) 107/50  Pulse: 71 66 83 81  Resp: 16 16 17 17   Temp: 98.3 F (36.8 C) 97.6 F (36.4 C) 98.4 F (36.9 C) 97.9 F (36.6 C)  TempSrc: Oral Oral Oral Oral  SpO2: 100% 98% 100% 96%  Weight:      Height:        PHYSICAL EXAM: Right lower extremity Neurovascular intact Sensation intact distally Intact pulses distally Dorsiflexion/Plantar flexion intact Incision: dressing C/D/I No cellulitis present Compartment soft  LABS  Results for orders placed or performed during the hospital encounter of 10/05/20 (from the past 24 hour(s))  Hemoglobin and hematocrit, blood     Status: Abnormal   Collection Time: 10/08/20  7:24 PM  Result Value Ref Range   Hemoglobin 8.2 (L) 12.0 - 15.0 g/dL   HCT 10/10/20 (L) 36 - 46 %  CBC     Status: Abnormal   Collection Time: 10/09/20  6:03 AM  Result Value Ref Range   WBC 8.0 4.0 - 10.5 K/uL   RBC 2.76 (L) 3.87 - 5.11 MIL/uL   Hemoglobin 8.4 (L) 12.0 - 15.0 g/dL   HCT 10/11/20 (L) 36 - 46 %   MCV 90.6 80.0 - 100.0 fL   MCH 30.4 26.0 - 34.0 pg   MCHC 33.6 30.0 - 36.0 g/dL   RDW 26.9 48.5 - 46.2 %   Platelets 128 (L) 150 - 400 K/uL   nRBC 0.0 0.0 - 0.2 %  Basic metabolic panel     Status: Abnormal   Collection Time: 10/09/20  6:03 AM  Result Value Ref Range   Sodium 140 135 - 145 mmol/L   Potassium 3.8 3.5 - 5.1 mmol/L   Chloride 105 98 - 111 mmol/L   CO2 27 22 - 32 mmol/L   Glucose, Bld 102 (H) 70 - 99 mg/dL   BUN 14 8 - 23 mg/dL   Creatinine, Ser 10/11/20 0.44 - 1.00 mg/dL   Calcium 8.1 (L) 8.9 - 10.3 mg/dL   GFR,  Estimated 5.00 >93 mL/min   Anion gap 8 5 - 15    No results found.  Assessment/Plan: 3 Days Post-Op   Principal Problem:   Intertrochanteric fracture of right femur, closed, initial encounter [facility]) Active Problems:   Obesity   Essential hypertension   Dementia without behavioral disturbance [facility])  Patient improving from an orthopedic standpoint.  Patient should continue with physical therapy.  She should continue Lovenox for DVT prophylaxis for at least 2 weeks following discharge.  Patient is weightbearing as tolerated right lower extremity and will follow up with [facility] in the office in 2 weeks after discharge.   Martha Clan , MD 10/09/2020, 1:59 PM

## 2020-10-09 NOTE — Progress Notes (Signed)
Physical Therapy Treatment Patient Details Name: Cheyenne Parker MRN: [id number] DOB: 06-15-1946 Today's Date: 10/09/2020    History of Present Illness Pt is a 74 y.o. female presenting to hospital 11/13 s/p mechanical fall walking down basement steps.  Pt s/p 11/14 intramedullary fixation of R comminuted intertrochanteric hip fx.  Imaging also showing age-indeterminate rib fx of R lateral 3rd and 4th ribs.  PMH includes anemia, IFG, obesity, osteoporosis, dementia.    PT Comments    Pt was in recliner upon arriving. She agrees to PT session and is cooperative and pleasant throughout. Does have cognition deficits that showed up with multi-step commands/ following directions. She was able to stand to RW with Min assist and ambulate 40 ft with RW prior to fatiguing and needing to sit. She struggled with proper gait sequencing. Will benefit from STR at DC to address deficits and assist pt back to PLOF. At conclusion of session pt was in bed, with call bell in reach, and RN tech aware bed alarm is not properly working.      Follow Up Recommendations  SNF     Equipment Recommendations  Rolling walker with 5" wheels;3in1 (PT)    Recommendations for Other Services       Precautions / Restrictions Precautions Precautions: Fall Restrictions Weight Bearing Restrictions: Yes RLE Weight Bearing: Weight bearing as tolerated    Mobility  Bed Mobility Overal bed mobility: Needs Assistance Bed Mobility: Sit to Supine       Sit to supine: Max assist (of one)   General bed mobility comments: Pt required max assist to return to supine from EOB shor sit. Assisted BLes into bed with vsc throughout for technique and sequencing.  Transfers Overall transfer level: Needs assistance Equipment used: Rolling walker (2 wheeled) Transfers: Sit to/from Stand Sit to Stand: From elevated surface;Min assist         General transfer comment: pt performed STS from recliner with min assist + vcs for  improved safety and technique  Ambulation/Gait Ambulation/Gait assistance: Min assist Gait Distance (Feet): 40 Feet Assistive device: Rolling walker (2 wheeled) Gait Pattern/deviations: Step-to pattern;Antalgic Gait velocity: decreased   General Gait Details: pt was able to ambulate to RN desk and return. poor ability to follow desired gait sequencing. Did endorse fatigue post ambulation. reviewed ther ex handout but pt too fatigued to perform at this time.       Balance Overall balance assessment: Needs assistance Sitting-balance support: Feet supported;Bilateral upper extremity supported Sitting balance-Leahy Scale: Good Sitting balance - Comments: no LOB sitting EOB x > 5 minutes   Standing balance support: Bilateral upper extremity supported;During functional activity Standing balance-Leahy Scale: Fair Standing balance comment: reliant on BUE support during dynamic standing activity/ambulation       Cognition Arousal/Alertness: Awake/alert Behavior During Therapy: WFL for tasks assessed/performed Overall Cognitive Status: Within Functional Limits for tasks assessed      General Comments: pt was A and O x 4 and able to follow commands consistently throughout             Pertinent Vitals/Pain Pain Assessment: 0-10 Pain Score: 5  Pain Location: R hip/thigh Pain Descriptors / Indicators: Aching;Operative site guarding;Discomfort;Grimacing;Guarding;Sore;Tender Pain Intervention(s): Limited activity within patient's tolerance;Monitored during session;Premedicated before session;Repositioned           PT Goals (current goals can now be found in the care plan section) Acute Rehab PT Goals Patient Stated Goal: return to PLOF Progress towards PT goals: Progressing toward goals    Frequency  BID      PT Plan Current plan remains appropriate    Co-evaluation     PT goals addressed during session: Mobility/safety with mobility;Balance;Proper use of  DME;Strengthening/ROM        AM-PAC PT "6 Clicks" Mobility   Outcome Measure  Help needed turning from your back to your side while in a flat bed without using bedrails?: A Little Help needed moving from lying on your back to sitting on the side of a flat bed without using bedrails?: A Little Help needed moving to and from a bed to a chair (including a wheelchair)?: A Little Help needed standing up from a chair using your arms (e.g., wheelchair or bedside chair)?: A Lot Help needed to walk in hospital room?: A Lot Help needed climbing 3-5 steps with a railing? : A Lot 6 Click Score: 15    End of Session   Activity Tolerance: Patient tolerated treatment well;Patient limited by fatigue Patient left: in bed;with call bell/phone within reach;with bed alarm set;with SCD's reapplied Nurse Communication: Mobility status;Precautions;Weight bearing status;Other (comment) PT Visit Diagnosis: Other abnormalities of gait and mobility (R26.89);History of falling (Z91.81);Muscle weakness (generalized) (M62.81);Difficulty in walking, not elsewhere classified (R26.2);Pain Pain - Right/Left: Right Pain - part of body: Hip     Time: 1530-1550 PT Time Calculation (min) (ACUTE ONLY): 20 min  Charges:  $Gait Training: 8-22 mins                     Jetta Lout PTA 10/09/20, 4:14 PM

## 2020-10-09 NOTE — Progress Notes (Signed)
PROGRESS NOTE    Cheyenne Parker  BMW:[id number] DOB: 28-May-1946 DOA: 10/05/2020 PCP: Corky Downs, MD   Brief Narrative: Patient is 74 year old female with past medical history of hypertension, hyperlipidemia, dementia, obesity with BMI of 30 presents to emergency department for evaluation of right hip pain after a mechanical fall at home.  Status post intramedullary fixation for right intertrochanteric hip fracture on 10/06/2020.  Had some postoperative blood loss anemia.  Responded to transfusion.  Hip pain improving.   Assessment & Plan:   Principal Problem:   Intertrochanteric fracture of right femur, closed, initial encounter (HCC) Active Problems:   Obesity   Essential hypertension   Dementia without behavioral disturbance (HCC)   Right intertrochanteric femur fracture: -Status post mechanical fall. -Status post intramedullary fixation of right comminuted intertrochanteric hip fracture, POD: 3 -Multimodal pain regimen -Monitor H&H and vitals closely. -PT recommended SNF -Lovenox VT prophylaxis  Acute postoperative blood loss anemia -H&H dropped from 12.0-8.6-7.5-6.9  on 10/08/2020 -Transfused 1 unit -Hemoglobin responded appropriately -Continue to monitor H&H closely.  Urinary retention: -Patient had urinary retention issues prior to the surgery. -Had Foley placed prior to the surgery which DC'd on 11/15. -She continues to have problem with urinary retention.   - I & O cath done multiple times. -Foley catheter now in place -Plan: We will maintain Foley catheter for 1 day.  Voiding trial on 10/10/2020.  Continue Flomax  Hypertension: Stable -Continue amlodipine.  Dementia: -Continue  Aricept and Namenda  Hyperlipidemia: Continue statin.  Obesity with BMI of 30: -Diet modification/exercise and weight loss recommended.  Constipation  Likely in the setting of pain medications -Continue with MiraLAX and stool softeners. -Is passing gas however has  not had bowel movement yet.   DVT prophylaxis: Lovenox Code Status: Full Family Communication: Husband at bedside Disposition Plan: Status is: Inpatient  Remains inpatient appropriate because:Inpatient level of care appropriate due to severity of illness   Dispo: The patient is from: Home              Anticipated d/c is to: SNF              Anticipated d/c date is: 1 day              Patient currently is not medically stable to d/c.  Patient still has not had postoperative bowel movement.  Also some issues with urinary retention requiring Foley catheter placement.  Patient is able to have BM and pain under control we will plan on discharge to skilled nursing facility within 24 hours       Consultants:   Orthopedics  Procedures:  Intramedullary fixation of hip fracture  Antimicrobials:   None   Subjective: Seen and examined.  Reports improvement in pain.  No other complaints.  Objective: Vitals:   10/09/20 0005 10/09/20 0331 10/09/20 0756 10/09/20 1125  BP: (!) 120/47 (!) 115/48 (!) 122/49 (!) 107/50  Pulse: 71 66 83 81  Resp: 16 16 17 17   Temp: 98.3 F (36.8 C) 97.6 F (36.4 C) 98.4 F (36.9 C) 97.9 F (36.6 C)  TempSrc: Oral Oral Oral Oral  SpO2: 100% 98% 100% 96%  Weight:      Height:        Intake/Output Summary (Last 24 hours) at 10/09/2020 1445 Last data filed at 10/09/2020 1002 Gross per 24 hour  Intake 450.83 ml  Output 400 ml  Net 50.83 ml   Filed Weights   10/05/20 1406  Weight: 74.4 kg  Examination:  General exam: Appears calm and comfortable  Respiratory system: Clear to auscultation. Respiratory effort normal. Cardiovascular system: S1 & S2 heard, RRR. No JVD, murmurs, rubs, gallops or clicks. No pedal edema. Gastrointestinal system: Abdomen is nondistended, soft and nontender. No organomegaly or masses felt. Normal bowel sounds heard. Central nervous system: Alert and oriented. No focal neurological deficits. Extremities: Right  lower extremity tender to touch.  Decreased power Skin: No rashes, lesions or ulcers Psychiatry: Judgement and insight appear normal. Mood & affect appropriate.     Data Reviewed: I have personally reviewed following labs and imaging studies  CBC: Recent Labs  Lab 10/05/20 1405 10/05/20 1405 10/06/20 0414 10/06/20 0414 10/07/20 0449 10/08/20 0443 10/08/20 1018 10/08/20 1924 10/09/20 0603  WBC 9.9   < > 10.0  --  9.1 8.3 8.8  --  8.0  NEUTROABS 6.2  --   --   --   --   --   --   --   --   HGB 12.0   < > 10.0*   < > 8.6* 7.5* 6.9* 8.2* 8.4*  HCT 36.3   < > 30.6*   < > 25.1* 22.7* 21.1* 24.3* 25.0*  MCV 92.4   < > 93.9  --  94.0 95.0 94.6  --  90.6  PLT 199   < > 186  --  141* 125* 121*  --  128*   < > = values in this interval not displayed.   Basic Metabolic Panel: Recent Labs  Lab 10/05/20 1405 10/06/20 0414 10/07/20 0449 10/08/20 0443 10/09/20 0603  NA 136 136 139 138 140  K 3.7 4.7 3.7 3.6 3.8  CL 102 105 105 108 105  CO2 24 27 25 25 27   GLUCOSE 116* 135* 127* 93 102*  BUN 17 18 19 15 14   CREATININE 0.93 0.84 0.76 0.55 0.62  CALCIUM 8.9 8.7* 8.4* 7.8* 8.1*   GFR: Estimated Creatinine Clearance: 59.1 mL/min (by C-G formula based on SCr of 0.62 mg/dL). Liver Function Tests: No results for input(s): AST, ALT, ALKPHOS, BILITOT, PROT, ALBUMIN in the last 168 hours. No results for input(s): LIPASE, AMYLASE in the last 168 hours. No results for input(s): AMMONIA in the last 168 hours. Coagulation Profile: Recent Labs  Lab 10/05/20 1405  INR 1.0   Cardiac Enzymes: No results for input(s): CKTOTAL, CKMB, CKMBINDEX, TROPONINI in the last 168 hours. BNP (last 3 results) No results for input(s): PROBNP in the last 8760 hours. HbA1C: No results for input(s): HGBA1C in the last 72 hours. CBG: No results for input(s): GLUCAP in the last 168 hours. Lipid Profile: No results for input(s): CHOL, HDL, LDLCALC, TRIG, CHOLHDL, LDLDIRECT in the last 72 hours. Thyroid  Function Tests: No results for input(s): TSH, T4TOTAL, FREET4, T3FREE, THYROIDAB in the last 72 hours. Anemia Panel: No results for input(s): VITAMINB12, FOLATE, FERRITIN, TIBC, IRON, RETICCTPCT in the last 72 hours. Sepsis Labs: No results for input(s): PROCALCITON, LATICACIDVEN in the last 168 hours.  Recent Results (from the past 240 hour(s))  Respiratory Panel by RT PCR (Flu A&B, Covid) - Nasopharyngeal Swab     Status: None   Collection Time: 10/05/20  2:56 PM   Specimen: Nasopharyngeal Swab  Result Value Ref Range Status   SARS Coronavirus 2 by RT PCR NEGATIVE NEGATIVE Final    Comment: (NOTE) SARS-CoV-2 target nucleic acids are NOT DETECTED.  The SARS-CoV-2 RNA is generally detectable in upper respiratoy specimens during the acute phase of infection. The lowest  concentration of SARS-CoV-2 viral copies this assay can detect is 131 copies/mL. A negative result does not preclude SARS-Cov-2 infection and should not be used as the sole basis for treatment or other patient management decisions. A negative result may occur with  improper specimen collection/handling, submission of specimen other than nasopharyngeal swab, presence of viral mutation(s) within the areas targeted by this assay, and inadequate number of viral copies (<131 copies/mL). A negative result must be combined with clinical observations, patient history, and epidemiological information. The expected result is Negative.  Fact Sheet for Patients:  https://www.moore.com/  Fact Sheet for Healthcare Providers:  https://www.young.biz/  This test is no t yet approved or cleared by the [place] FDA and  has been authorized for detection and/or diagnosis of SARS-CoV-2 by FDA under an Emergency Use Authorization (EUA). This EUA will remain  in effect (meaning this test can be used) for the duration of the COVID-19 declaration under Section 564(b)(1) of the Act, 21  U.S.C. section 360bbb-3(b)(1), unless the authorization is terminated or revoked sooner.     Influenza A by PCR NEGATIVE NEGATIVE Final   Influenza B by PCR NEGATIVE NEGATIVE Final    Comment: (NOTE) The Xpert Xpress SARS-CoV-2/FLU/RSV assay is intended as an aid in  the diagnosis of influenza from Nasopharyngeal swab specimens and  should not be used as a sole basis for treatment. Nasal washings and  aspirates are unacceptable for Xpert Xpress SARS-CoV-2/FLU/RSV  testing.  Fact Sheet for Patients: https://www.moore.com/  Fact Sheet for Healthcare Providers: https://www.young.biz/  This test is not yet approved or cleared by the [place] FDA and  has been authorized for detection and/or diagnosis of SARS-CoV-2 by  FDA under an Emergency Use Authorization (EUA). This EUA will remain  in effect (meaning this test can be used) for the duration of the  Covid-19 declaration under Section 564(b)(1) of the Act, 21  U.S.C. section 360bbb-3(b)(1), unless the authorization is  terminated or revoked. Performed at [facility], [address], [place], [place] [address]   Surgical pcr screen     Status: Abnormal   Collection Time: 10/06/20  9:50 AM   Specimen: Nasal Mucosa; Nasal Swab  Result Value Ref Range Status   MRSA, PCR NEGATIVE NEGATIVE Final   Staphylococcus aureus POSITIVE (A) NEGATIVE Final    Comment: (NOTE) The Xpert SA Assay (FDA approved for NASAL specimens in patients 24 years of age and older), is one component of a comprehensive surveillance program. It is not intended to diagnose infection nor to guide or monitor treatment. Performed at [facility], [address] [place], [place] [address]          Radiology Studies: No results found.      Scheduled Meds: . amLODipine  5 mg Oral Daily  . atorvastatin  10 mg Oral QHS  . Chlorhexidine Gluconate Cloth  6 each Topical Daily  .  cholecalciferol  1,000 Units Oral Daily  . donepezil  5 mg Oral QHS  . enoxaparin (LOVENOX) injection  40 mg Subcutaneous Q24H  . feeding supplement  237 mL Oral BID BM  . ketorolac  15 mg Intravenous Q6H  . loratadine  10 mg Oral Daily  . memantine  5 mg Oral BID  . multivitamin with minerals  1 tablet Oral Daily  . mupirocin ointment   Nasal BID  . polyethylene glycol  17 g Oral Daily  . senna-docusate  1 tablet Oral BID  . tamsulosin  0.4 mg Oral Daily  .  traMADol  50 mg Oral Q6H   Continuous Infusions: . sodium chloride 75 mL/hr (10/06/20 1505)     LOS: 4 days    Time spent: 15 minutes    Tresa Moore, MD [facility] Pager [phone]  If 7PM-7AM, please contact night-coverage 10/09/2020, 2:45 PM

## 2020-10-09 NOTE — Care Management Important Message (Signed)
Important Message  Patient Details  Name: Cheyenne Parker MRN: [id number] Date of Birth: December 01, 1945   Medicare Important Message Given:  N/A - LOS <3 / Initial given by admissions     Cheyenne Parker 10/09/2020, 8:14 AM

## 2020-10-09 NOTE — Progress Notes (Signed)
Physical Therapy Treatment Patient Details Name: Cheyenne Parker MRN: [id number] DOB: [birth year] Today's Date: 10/09/2020    History of Present Illness Pt is a 74 y.o. female presenting to hospital 11/13 s/p mechanical fall walking down basement steps.  Pt s/p 11/14 intramedullary fixation of R comminuted intertrochanteric hip fx.  Imaging also showing age-indeterminate rib fx of R lateral 3rd and 4th ribs.  PMH includes anemia, IFG, obesity, osteoporosis, dementia.    PT Comments    Pt was long sitting in bed with supportive spouse at bedside. Agreeable to PT session. Does endorse 10/10 pain that decreased to 6/10 during session. She was able to exit R side of bed with increased time + max assist 2/2 to pain. Sat EOB without LOB x > 5 minutes prior to standing to RW 3 x.  Was able to ambulate 5 ft from EOB to recliner. Very slow step to antalgic gait pattern. Will continue to benefit from skilled PT going forward to address deficits and assist pt with returning to PLOF. She was sitting up in recliner with chair alarm in place, call bell in reach, and pt's spouse at bedside.   Follow Up Recommendations  SNF     Equipment Recommendations  Rolling walker with 5" wheels;3in1 (PT)    Recommendations for Other Services       Precautions / Restrictions Precautions Precautions: Fall Restrictions Weight Bearing Restrictions: Yes RLE Weight Bearing: Weight bearing as tolerated    Mobility  Bed Mobility Overal bed mobility: Needs Assistance Bed Mobility: Supine to Sit  Supine to sit: HOB elevated;Max assist  General bed mobility comments: max assist of one to exit R side of bed  Transfers Overall transfer level: Needs assistance Equipment used: Rolling walker (2 wheeled) Transfers: Sit to/from Stand Sit to Stand: From elevated surface;Min assist         General transfer comment: Pt performed STS 3 x EOB Min assist fo one from slightly elevated bed  height.  Ambulation/Gait Ambulation/Gait assistance: Min assist Gait Distance (Feet): 5 Feet Assistive device: Rolling walker (2 wheeled) Gait Pattern/deviations: Step-to pattern;Antalgic Gait velocity: decreased   General Gait Details: poor gait quality 2/2 to pain. step to antalgic gait pattern to go from EOB to recliner placed 5 ft away.        Balance Overall balance assessment: Needs assistance Sitting-balance support: Feet supported;Bilateral upper extremity supported Sitting balance-Leahy Scale: Good Sitting balance - Comments: no LOB sitting EOB x > 5 minutes   Standing balance support: Bilateral upper extremity supported;During functional activity Standing balance-Leahy Scale: Fair Standing balance comment: reliant on BUE support 2/2 to pain        Cognition Arousal/Alertness: Awake/alert Behavior During Therapy: WFL for tasks assessed/performed Overall Cognitive Status: Within Functional Limits for tasks assessed    General Comments: pt was A and O x 4 and able to follow commands consistently throughout             Pertinent Vitals/Pain Pain Assessment: 0-10 Pain Score: 10-Worst pain ever (decreased to 6 during session) Pain Location: R hip/thigh Pain Descriptors / Indicators: Aching;Operative site guarding;Discomfort;Grimacing;Guarding;Sore;Tender Pain Intervention(s): Limited activity within patient's tolerance;Premedicated before session;Monitored during session;Repositioned;Ice applied           PT Goals (current goals can now be found in the care plan section) Acute Rehab PT Goals Patient Stated Goal: return to PLOF Progress towards PT goals: Progressing toward goals    Frequency    BID      PT Plan Current  plan remains appropriate    Co-evaluation     PT goals addressed during session: Mobility/safety with mobility;Balance;Proper use of DME;Strengthening/ROM        AM-PAC PT "6 Clicks" Mobility   Outcome Measure  Help needed  turning from your back to your side while in a flat bed without using bedrails?: A Lot Help needed moving from lying on your back to sitting on the side of a flat bed without using bedrails?: A Lot Help needed moving to and from a bed to a chair (including a wheelchair)?: A Lot Help needed standing up from a chair using your arms (e.g., wheelchair or bedside chair)?: A Lot Help needed to walk in hospital room?: A Lot Help needed climbing 3-5 steps with a railing? : Total 6 Click Score: 11    End of Session Equipment Utilized During Treatment: Gait belt Activity Tolerance: Patient tolerated treatment well;Patient limited by pain Patient left: in chair;with call bell/phone within reach;with chair alarm set;with family/visitor present Nurse Communication: Mobility status;Precautions;Weight bearing status;Other (comment) PT Visit Diagnosis: Other abnormalities of gait and mobility (R26.89);History of falling (Z91.81);Muscle weakness (generalized) (M62.81);Difficulty in walking, not elsewhere classified (R26.2);Pain Pain - Right/Left: Right Pain - part of body: Hip     Time: 1022-1050 PT Time Calculation (min) (ACUTE ONLY): 28 min  Charges:  $Gait Training: 8-22 mins $Therapeutic Activity: 8-22 mins                     Jetta Lout PTA 10/09/20, 12:07 PM

## 2020-10-10 DIAGNOSIS — S72141A Displaced intertrochanteric fracture of right femur, initial encounter for closed fracture: Secondary | ICD-10-CM | POA: Diagnosis not present

## 2020-10-10 MED ORDER — GABAPENTIN 300 MG PO CAPS
300.0000 mg | ORAL_CAPSULE | Freq: Two times a day (BID) | ORAL | Status: DC
  Administered 2020-10-10: 300 mg via ORAL
  Filled 2020-10-10: qty 1

## 2020-10-10 MED ORDER — GABAPENTIN 300 MG PO CAPS
300.0000 mg | ORAL_CAPSULE | Freq: Three times a day (TID) | ORAL | Status: DC
  Administered 2020-10-10 – 2020-10-11 (×3): 300 mg via ORAL
  Filled 2020-10-10 (×3): qty 1

## 2020-10-10 MED ORDER — POLYETHYLENE GLYCOL 3350 17 G PO PACK
17.0000 g | PACK | Freq: Two times a day (BID) | ORAL | Status: DC
  Administered 2020-10-10 – 2020-10-11 (×2): 17 g via ORAL
  Filled 2020-10-10 (×2): qty 1

## 2020-10-10 NOTE — Progress Notes (Signed)
°  Subjective:  POD #4 s/p intramedullary fixation for right intertrochanteric hip fracture.   Patient reports right hip pain as mild to moderate.  Patient eating lunch in her [employer].  Her husband is at the bedside.  Objective:   VITALS:   Vitals:   10/09/20 2313 10/10/20 0309 10/10/20 0748 10/10/20 1148  BP: 119/72 123/60 (!) 144/62 121/62  Pulse: 92 95 (!) 103 92  Resp: 16 16 16 16   Temp: 98.3 F (36.8 C) 99.2 F (37.3 C) 98.2 F (36.8 C) 98 F (36.7 C)  TempSrc: Oral  Oral Oral  SpO2: 95% 91% 94% 95%  Weight:      Height:        PHYSICAL EXAM: Right lower extremity Neurovascular intact Sensation intact distally Intact pulses distally Dorsiflexion/Plantar flexion intact Incision: scant drainage No cellulitis present Compartment soft  LABS  No results found for this or any previous visit (from the past 24 hour(s)).  No results found.  Assessment/Plan: 4 Days Post-Op   Principal Problem:   Intertrochanteric fracture of right femur, closed, initial encounter [facility]) Active Problems:   Obesity   Essential hypertension   Dementia without behavioral disturbance (HCC)  Patient is doing well from orthopedic standpoint.  She may be is discharged to a skilled nursing facility of an orthopedic standpoint once cleared medically.  Patient will continue on Lovenox 40 mg daily or enteric-coated aspirin 3 2 5  mg p.o. twice daily for DVT prophylaxis until follow-up in the office.  Follow-up with [facility] in 10 to 14 days after discharge in the office.  Continue physical therapy.    , MD 10/10/2020, 1:11 PM

## 2020-10-10 NOTE — Progress Notes (Signed)
PT Cancellation Note  Patient Details Name: Cheyenne Parker MRN: [id number] DOB: Dec 12, 1945   Cancelled Treatment:     PT attempt. Pt politely refused stating  pain is too severe to participate at this time. Requested therapist return later. Acute PT will continue to follow per POC and progress as able per pt tolerance.   Rushie Chestnut 10/10/2020, 10:02 AM

## 2020-10-10 NOTE — Plan of Care (Signed)

## 2020-10-10 NOTE — Progress Notes (Signed)
Physical Therapy Treatment Patient Details Name: Cheyenne Parker MRN: [id number] DOB: 1946-05-03 Today's Date: 10/10/2020    History of Present Illness Pt is a 74 y.o. female presenting to hospital 11/13 s/p mechanical fall walking down basement steps.  Pt s/p 11/14 intramedullary fixation of R comminuted intertrochanteric hip fx.  Imaging also showing age-indeterminate rib fx of R lateral 3rd and 4th ribs.  PMH includes anemia, IFG, obesity, osteoporosis, dementia.    PT Comments    Pt was long sitting in bed upon arriving. She agrees to PT session and reports feeling better than earlier in day. Agrees to session and OOB activity. Was able to exit bed with max assist. Sat EOB x several minutes prior to standing and ambulating ~ 12 ft with RW. Endorses fatigue/pain and requested not ambulating into hallway. Was agreeable to performing there ex. See exercises listed below. Overall pt tolerate session well but was limited this date by pain/fatigue. Will benefit form continues skilled PT at DC to address deficits while assisting pt to PLOF.    Follow Up Recommendations  SNF     Equipment Recommendations  Rolling walker with 5" wheels;3in1 (PT)    Recommendations for Other Services       Precautions / Restrictions Precautions Precautions: Fall Restrictions Weight Bearing Restrictions: Yes RLE Weight Bearing: Weight bearing as tolerated    Mobility  Bed Mobility Overal bed mobility: Needs Assistance Bed Mobility: Supine to Sit;Sit to Supine     Supine to sit: HOB elevated;Max assist     General bed mobility comments: pt required max assist to progress RLE to EOB   Transfers Overall transfer level: Needs assistance Equipment used: Rolling walker (2 wheeled) Transfers: Sit to/from Stand Sit to Stand: From elevated surface;Min assist            Ambulation/Gait Ambulation/Gait assistance: Min assist Gait Distance (Feet): 12 Feet Assistive device: Rolling walker (2  wheeled) Gait Pattern/deviations: Step-to pattern;Antalgic Gait velocity: decreased   General Gait Details: pt was able to ambulate 12 ft however reports fatigue and requested not to ambulate into hallway. did agree to continue session with ther ex      Balance Overall balance assessment: Needs assistance Sitting-balance support: Feet supported;Bilateral upper extremity supported Sitting balance-Leahy Scale: Good Sitting balance - Comments: no LOB sitting EOB x > 5 minutes   Standing balance support: Bilateral upper extremity supported;During functional activity Standing balance-Leahy Scale: Fair Standing balance comment: reliant on BUE support during dynamic standing activity/ambulation         Cognition Arousal/Alertness: Awake/alert Behavior During Therapy: WFL for tasks assessed/performed Overall Cognitive Status: Within Functional Limits for tasks assessed            General Comments: pt is A but does present with some cognition deficits that are only present with in depth questioning and multi step commands      Exercises Total Joint Exercises Ankle Circles/Pumps: AROM;Strengthening;Both;10 reps;Supine Quad Sets: AROM;Strengthening;Both;10 reps;Supine Gluteal Sets: AROM;10 reps Hip ABduction/ADduction: AAROM;Strengthening;Right;10 reps;Supine Straight Leg Raises: AAROM;5 reps        Pertinent Vitals/Pain Pain Assessment: 0-10 Pain Score: 4  Pain Location: R hip/thigh Pain Descriptors / Indicators: Aching;Operative site guarding;Discomfort;Grimacing;Guarding;Sore;Tender Pain Intervention(s): Limited activity within patient's tolerance;Monitored during session;Premedicated before session;Repositioned           PT Goals (current goals can now be found in the care plan section) Acute Rehab PT Goals Patient Stated Goal: return to PLOF Progress towards PT goals: Progressing toward goals    Frequency  BID      PT Plan Current plan remains appropriate        AM-PAC PT "6 Clicks" Mobility   Outcome Measure  Help needed turning from your back to your side while in a flat bed without using bedrails?: A Little Help needed moving from lying on your back to sitting on the side of a flat bed without using bedrails?: A Lot Help needed moving to and from a bed to a chair (including a wheelchair)?: A Lot Help needed standing up from a chair using your arms (e.g., wheelchair or bedside chair)?: A Little Help needed to walk in hospital room?: A Little Help needed climbing 3-5 steps with a railing? : A Lot 6 Click Score: 15    End of Session Equipment Utilized During Treatment: Gait belt Activity Tolerance: Patient limited by fatigue;Patient limited by pain Patient left: in chair;with call bell/phone within reach;with chair alarm set;with family/visitor present Nurse Communication: Mobility status;Precautions;Weight bearing status;Other (comment) PT Visit Diagnosis: Other abnormalities of gait and mobility (R26.89);History of falling (Z91.81);Muscle weakness (generalized) (M62.81);Difficulty in walking, not elsewhere classified (R26.2);Pain Pain - Right/Left: Right Pain - part of body: Hip     Time: 1255-1320 PT Time Calculation (min) (ACUTE ONLY): 25 min  Charges:  $Gait Training: 8-22 mins $Therapeutic Exercise: 8-22 mins                     Jetta Lout PTA 10/10/20, 4:59 PM

## 2020-10-10 NOTE — Progress Notes (Signed)
PROGRESS NOTE    Cheyenne Parker  IRW:[id number] DOB: [birth year] DOA: 10/05/2020 PCP: Corky Downs, MD   Brief Narrative: Patient is 74 year old female with past medical history of hypertension, hyperlipidemia, dementia, obesity with BMI of 30 presents to emergency department for evaluation of right hip pain after a mechanical fall at home.  Status post intramedullary fixation for right intertrochanteric hip fracture on 10/06/2020.  Had some postoperative blood loss anemia.  Responded to transfusion.    Pain remains substantial and has proved to be an impediment to recovery.  Has not had bowel movement yet.  Foley discontinued 10/10/2020.  Voiding trial today.  Assessment & Plan:   Principal Problem:   Intertrochanteric fracture of right femur, closed, initial encounter (HCC) Active Problems:   Obesity   Essential hypertension   Dementia without behavioral disturbance (HCC)   Right intertrochanteric femur fracture: -Status post mechanical fall. -Status post intramedullary fixation of right comminuted intertrochanteric hip fracture, POD: 4 -Multimodal pain regimen.  Avoid narcotics if able -PT recommended SNF -Lovenox VT prophylaxis -Bowel regimen -Voiding trial 10/10/2020.  Foley discontinued.  Every 6 hours bladder scan  Acute postoperative blood loss anemia -H&H dropped from 12.0-8.6-7.5-6.9  on 10/08/2020 -Transfused 1 unit -Hemoglobin responded appropriately -Continue to monitor H&H closely.  Urinary retention: -Patient had urinary retention issues prior to the surgery. -Had Foley placed prior to the surgery which DC'd on 11/15. -She continues to have problem with urinary retention.   - I & O cath done multiple times. -Foley catheter now in place -Plan: Discontinue Foley catheter today 10/10/2020 Every 6 hours bladder scans And straight cath as needed bladder volume greater than 350 cc Take all measures to avoid replacing Foley catheter Encourage ambulation, out  of bed to chair, bed exercises  Hypertension: Stable -Continue amlodipine.  Dementia: -Continue  Aricept and Namenda  Hyperlipidemia: Continue statin.  Obesity with BMI of 30: -Diet modification/exercise and weight loss recommended.  Constipation  Likely in the setting of pain medications -Continue with MiraLAX and stool softeners.  MiraLAX increased to twice daily -Is passing gas however has not had bowel movement yet. -Minimize narcotics   DVT prophylaxis: Lovenox Code Status: Full Family Communication: Husband at bedside Disposition Plan: Status is: Inpatient  Remains inpatient appropriate because:Inpatient level of care appropriate due to severity of illness   Dispo: The patient is from: Home              Anticipated d/c is to: SNF              Anticipated d/c date is: 1 day              Patient currently is not medically stable to d/c.  No postop BM yet.  Also issues with urinary retention.  Voiding trial today.  Attempt to minimize narcotics.       Consultants:   Orthopedics  Procedures:  Intramedullary fixation of hip fracture  Antimicrobials:   None   Subjective: Seen and examined.  Still with persistent pain.  No other complaints  Objective: Vitals:   10/09/20 2313 10/10/20 0309 10/10/20 0748 10/10/20 1148  BP: 119/72 123/60 (!) 144/62 121/62  Pulse: 92 95 (!) 103 92  Resp: 16 16 16 16   Temp: 98.3 F (36.8 C) 99.2 F (37.3 C) 98.2 F (36.8 C) 98 F (36.7 C)  TempSrc: Oral  Oral Oral  SpO2: 95% 91% 94% 95%  Weight:      Height:        Intake/Output  Summary (Last 24 hours) at 10/10/2020 1416 Last data filed at 10/10/2020 1358 Gross per 24 hour  Intake 425 ml  Output 350 ml  Net 75 ml   Filed Weights   10/05/20 1406  Weight: 74.4 kg    Examination:  General exam: Appears calm and comfortable  Respiratory system: Clear to auscultation. Respiratory effort normal. Cardiovascular system: S1 & S2 heard, RRR. No JVD,  murmurs, rubs, gallops or clicks. No pedal edema. Gastrointestinal system: Abdomen is nondistended, soft and nontender. No organomegaly or masses felt. Normal bowel sounds heard. Central nervous system: Alert and oriented. No focal neurological deficits. Extremities: RLE tender to touch.  Decreased power.  Decreased level mobility Skin: No rashes, lesions or ulcers Psychiatry: Judgement and insight appear normal. Mood & affect appropriate.     Data Reviewed: I have personally reviewed following labs and imaging studies  CBC: Recent Labs  Lab 10/05/20 1405 10/05/20 1405 10/06/20 0414 10/06/20 0414 10/07/20 0449 10/08/20 0443 10/08/20 1018 10/08/20 1924 10/09/20 0603  WBC 9.9   < > 10.0  --  9.1 8.3 8.8  --  8.0  NEUTROABS 6.2  --   --   --   --   --   --   --   --   HGB 12.0   < > 10.0*   < > 8.6* 7.5* 6.9* 8.2* 8.4*  HCT 36.3   < > 30.6*   < > 25.1* 22.7* 21.1* 24.3* 25.0*  MCV 92.4   < > 93.9  --  94.0 95.0 94.6  --  90.6  PLT 199   < > 186  --  141* 125* 121*  --  128*   < > = values in this interval not displayed.   Basic Metabolic Panel: Recent Labs  Lab 10/05/20 1405 10/06/20 0414 10/07/20 0449 10/08/20 0443 10/09/20 0603  NA 136 136 139 138 140  K 3.7 4.7 3.7 3.6 3.8  CL 102 105 105 108 105  CO2 24 27 25 25 27   GLUCOSE 116* 135* 127* 93 102*  BUN 17 18 19 15 14   CREATININE 0.93 0.84 0.76 0.55 0.62  CALCIUM 8.9 8.7* 8.4* 7.8* 8.1*   GFR: Estimated Creatinine Clearance: 59.1 mL/min (by C-G formula based on SCr of 0.62 mg/dL). Liver Function Tests: No results for input(s): AST, ALT, ALKPHOS, BILITOT, PROT, ALBUMIN in the last 168 hours. No results for input(s): LIPASE, AMYLASE in the last 168 hours. No results for input(s): AMMONIA in the last 168 hours. Coagulation Profile: Recent Labs  Lab 10/05/20 1405  INR 1.0   Cardiac Enzymes: No results for input(s): CKTOTAL, CKMB, CKMBINDEX, TROPONINI in the last 168 hours. BNP (last 3 results) No results for  input(s): PROBNP in the last 8760 hours. HbA1C: No results for input(s): HGBA1C in the last 72 hours. CBG: No results for input(s): GLUCAP in the last 168 hours. Lipid Profile: No results for input(s): CHOL, HDL, LDLCALC, TRIG, CHOLHDL, LDLDIRECT in the last 72 hours. Thyroid Function Tests: No results for input(s): TSH, T4TOTAL, FREET4, T3FREE, THYROIDAB in the last 72 hours. Anemia Panel: No results for input(s): VITAMINB12, FOLATE, FERRITIN, TIBC, IRON, RETICCTPCT in the last 72 hours. Sepsis Labs: No results for input(s): PROCALCITON, LATICACIDVEN in the last 168 hours.  Recent Results (from the past 240 hour(s))  Respiratory Panel by RT PCR (Flu A&B, Covid) - Nasopharyngeal Swab     Status: None   Collection Time: 10/05/20  2:56 PM   Specimen: Nasopharyngeal Swab  Result  Value Ref Range Status   SARS Coronavirus 2 by RT PCR NEGATIVE NEGATIVE Final    Comment: (NOTE) SARS-CoV-2 target nucleic acids are NOT DETECTED.  The SARS-CoV-2 RNA is generally detectable in upper respiratoy specimens during the acute phase of infection. The lowest concentration of SARS-CoV-2 viral copies this assay can detect is 131 copies/mL. A negative result does not preclude SARS-Cov-2 infection and should not be used as the sole basis for treatment or other patient management decisions. A negative result may occur with  improper specimen collection/handling, submission of specimen other than nasopharyngeal swab, presence of viral mutation(s) within the areas targeted by this assay, and inadequate number of viral copies (<131 copies/mL). A negative result must be combined with clinical observations, patient history, and epidemiological information. The expected result is Negative.  Fact Sheet for Patients:  https://www.moore.com/  Fact Sheet for Healthcare Providers:  https://www.young.biz/  This test is no t yet approved or cleared by the [place] FDA  and  has been authorized for detection and/or diagnosis of SARS-CoV-2 by FDA under an Emergency Use Authorization (EUA). This EUA will remain  in effect (meaning this test can be used) for the duration of the COVID-19 declaration under Section 564(b)(1) of the Act, 21 U.S.C. section 360bbb-3(b)(1), unless the authorization is terminated or revoked sooner.     Influenza A by PCR NEGATIVE NEGATIVE Final   Influenza B by PCR NEGATIVE NEGATIVE Final    Comment: (NOTE) The Xpert Xpress SARS-CoV-2/FLU/RSV assay is intended as an aid in  the diagnosis of influenza from Nasopharyngeal swab specimens and  should not be used as a sole basis for treatment. Nasal washings and  aspirates are unacceptable for Xpert Xpress SARS-CoV-2/FLU/RSV  testing.  Fact Sheet for Patients: https://www.moore.com/  Fact Sheet for Healthcare Providers: https://www.young.biz/  This test is not yet approved or cleared by the [place] FDA and  has been authorized for detection and/or diagnosis of SARS-CoV-2 by  FDA under an Emergency Use Authorization (EUA). This EUA will remain  in effect (meaning this test can be used) for the duration of the  Covid-19 declaration under Section 564(b)(1) of the Act, 21  U.S.C. section 360bbb-3(b)(1), unless the authorization is  terminated or revoked. Performed at [facility], [address], [place], [place] [address]   Surgical pcr screen     Status: Abnormal   Collection Time: 10/06/20  9:50 AM   Specimen: Nasal Mucosa; Nasal Swab  Result Value Ref Range Status   MRSA, PCR NEGATIVE NEGATIVE Final   Staphylococcus aureus POSITIVE (A) NEGATIVE Final    Comment: (NOTE) The Xpert SA Assay (FDA approved for NASAL specimens in patients 64 years of age and older), is one component of a comprehensive surveillance program. It is not intended to diagnose infection nor to guide or monitor treatment. Performed at [facility], [address], [place], [place] [address]          Radiology Studies: No results found.      Scheduled Meds: . amLODipine  5 mg Oral Daily  . atorvastatin  10 mg Oral QHS  . Chlorhexidine Gluconate Cloth  6 each Topical Daily  . cholecalciferol  1,000 Units Oral Daily  . donepezil  5 mg Oral QHS  . enoxaparin (LOVENOX) injection  40 mg Subcutaneous Q24H  . feeding supplement  237 mL Oral BID BM  . gabapentin  300 mg Oral BID  . ketorolac  15 mg Intravenous Q6H  . loratadine  10  mg Oral Daily  . memantine  5 mg Oral BID  . multivitamin with minerals  1 tablet Oral Daily  . mupirocin ointment   Nasal BID  . polyethylene glycol  17 g Oral BID  . senna-docusate  1 tablet Oral BID  . tamsulosin  0.4 mg Oral Daily  . traMADol  50 mg Oral Q6H   Continuous Infusions: . sodium chloride 75 mL/hr (10/09/20 1801)     LOS: 5 days    Time spent: 25 minutes    Tresa MooreSudheer B Avon Molock, MD [facility] Pager [phone]  If 7PM-7AM, please contact night-coverage 10/10/2020, 2:16 PM

## 2020-10-10 NOTE — TOC Progression Note (Signed)
Transition of Care [facility]) - Progression Note    Patient Details  Name: Cheyenne Parker MRN: [id number] Date of Birth: [birth year]  [facility] CM/SW Contact  Trenton Founds, RN Phone Number: 10/10/2020, 7:47 AM  Clinical Narrative:   RNCM received insurance authorization through [employer] portal for SNF stay beginning 11/18 with a next review date of 11/22. Plan auth ID# [id number].          Expected Discharge Plan and Services                                                 Social Determinants of Health (SDOH) Interventions    Readmission Risk Interventions No flowsheet data found.

## 2020-10-11 DIAGNOSIS — S72141A Displaced intertrochanteric fracture of right femur, initial encounter for closed fracture: Secondary | ICD-10-CM | POA: Diagnosis not present

## 2020-10-11 LAB — CBC WITH DIFFERENTIAL/PLATELET
Abs Immature Granulocytes: 0.02 10*3/uL (ref 0.00–0.07)
Basophils Absolute: 0 10*3/uL (ref 0.0–0.1)
Basophils Relative: 0 %
Eosinophils Absolute: 0.3 10*3/uL (ref 0.0–0.5)
Eosinophils Relative: 4 %
HCT: 24.1 % — ABNORMAL LOW (ref 36.0–46.0)
Hemoglobin: 8.2 g/dL — ABNORMAL LOW (ref 12.0–15.0)
Immature Granulocytes: 0 %
Lymphocytes Relative: 24 %
Lymphs Abs: 1.7 10*3/uL (ref 0.7–4.0)
MCH: 31.2 pg (ref 26.0–34.0)
MCHC: 34 g/dL (ref 30.0–36.0)
MCV: 91.6 fL (ref 80.0–100.0)
Monocytes Absolute: 0.6 10*3/uL (ref 0.1–1.0)
Monocytes Relative: 8 %
Neutro Abs: 4.3 10*3/uL (ref 1.7–7.7)
Neutrophils Relative %: 64 %
Platelets: 167 10*3/uL (ref 150–400)
RBC: 2.63 MIL/uL — ABNORMAL LOW (ref 3.87–5.11)
RDW: 13.5 % (ref 11.5–15.5)
WBC: 6.8 10*3/uL (ref 4.0–10.5)
nRBC: 0 % (ref 0.0–0.2)

## 2020-10-11 LAB — RESP PANEL BY RT-PCR (FLU A&B, COVID) ARPGX2
Influenza A by PCR: NEGATIVE
Influenza B by PCR: NEGATIVE
SARS Coronavirus 2 by RT PCR: NEGATIVE

## 2020-10-11 LAB — BASIC METABOLIC PANEL
Anion gap: 7 (ref 5–15)
BUN: 16 mg/dL (ref 8–23)
CO2: 25 mmol/L (ref 22–32)
Calcium: 8.4 mg/dL — ABNORMAL LOW (ref 8.9–10.3)
Chloride: 109 mmol/L (ref 98–111)
Creatinine, Ser: 0.56 mg/dL (ref 0.44–1.00)
GFR, Estimated: >60 mL/min (ref >60)
Glucose, Bld: 97 mg/dL (ref 70–99)
Potassium: 3.4 mmol/L — ABNORMAL LOW (ref 3.5–5.1)
Sodium: 141 mmol/L (ref 135–145)

## 2020-10-11 MED ORDER — OXYCODONE HCL 5 MG PO TABS: 5.0000 mg | ORAL_TABLET | ORAL | 0 refills | Status: AC | PRN

## 2020-10-11 MED ORDER — POLYETHYLENE GLYCOL 3350 17 G PO PACK
17.0000 g | PACK | Freq: Every day | ORAL | 0 refills | Status: DC | PRN
Start: 2020-10-11 — End: 2021-09-26

## 2020-10-11 MED ORDER — VITAMIN D3 25 MCG (1000 UNIT) PO TABS: 1000.0000 [IU] | ORAL_TABLET | Freq: Every day | ORAL | Status: AC

## 2020-10-11 MED ORDER — ACETAMINOPHEN 325 MG PO TABS: 650.0000 mg | ORAL_TABLET | Freq: Four times a day (QID) | ORAL | Status: DC | PRN

## 2020-10-11 MED ORDER — SENNOSIDES-DOCUSATE SODIUM 8.6-50 MG PO TABS: 1.0000 | ORAL_TABLET | Freq: Two times a day (BID) | ORAL | Status: AC

## 2020-10-11 MED ORDER — GABAPENTIN 300 MG PO CAPS
300.0000 mg | ORAL_CAPSULE | Freq: Three times a day (TID) | ORAL | Status: DC
Start: 2020-10-11 — End: 2021-09-26

## 2020-10-11 MED ORDER — POTASSIUM CHLORIDE CRYS ER 20 MEQ PO TBCR
40.0000 meq | EXTENDED_RELEASE_TABLET | Freq: Once | ORAL | Status: AC
  Administered 2020-10-11: 40 meq via ORAL
  Filled 2020-10-11: qty 2

## 2020-10-11 MED ORDER — BISACODYL 10 MG RE SUPP: 10.0000 mg | Freq: Every day | RECTAL | 0 refills | Status: DC | PRN

## 2020-10-11 MED ORDER — ENOXAPARIN SODIUM 40 MG/0.4ML ~~LOC~~ SOLN
40.0000 mg | SUBCUTANEOUS | 0 refills | Status: DC
Start: 2020-10-12 — End: 2021-11-11

## 2020-10-11 NOTE — Discharge Summary (Signed)
Physician Discharge Summary  Cheyenne Parker XFG:[id number] DOB: 09-11-1946 DOA: 10/05/2020  PCP: Corky Downs, MD  Admit date: 10/05/2020 Discharge date: 10/11/2020  Admitted From: Home Disposition: SNF  Recommendations for Outpatient Follow-up:  1. Follow up with PCP in 1-2 weeks 2. Follow-up with orthopedics in 10 to 14 days  Home Health: No Equipment/Devices: None Discharge Condition: Stable CODE STATUS: Full Diet recommendation: Regular  Brief/Interim Summary: Patient is 74 year old female with past medical history of hypertension, hyperlipidemia, dementia, obesity with BMI of 30 presents to emergency department for evaluation of right hip pain after a mechanical fall at home.  Status post intramedullary fixation for right intertrochanteric hip fracture on 10/06/2020.  Had some postoperative blood loss anemia.  Responded to transfusion.    Pain remained substantial however patient slowly demonstrating signs of improvement.  Had small bowel movement on 10/11/2020.  Was also able to void spontaneously.  Will continue to require intensive rehabilitation in a monitored setting.  She stable for discharge to skilled nursing facility at this time.  Per orthopedic recommendations we will continue Lovenox subcutaneous 40 mg daily x2 weeks for VTE prophylaxis.  Follow-up PCP 1 to 2 weeks.  Follow-up with orthopedics Dr. Martha Clan in 10 to 14 days for wound check and staple removal.  Discharge Diagnoses:  Principal Problem:   Intertrochanteric fracture of right femur, closed, initial encounter [facility]) Active Problems:   Obesity   Essential hypertension   Dementia without behavioral disturbance (HCC)  Right intertrochanteric femur fracture: -Status post mechanical fall. -Status post intramedullary fixation of right comminuted intertrochanteric hip fracture, POD:5 Had bowel movement Able to urinate spontaneously Still with pain, recommend multimodal pain regimen on discharge Lovenox  for VTE prophylaxis, 14-day supply prescribed in discharge med rec  Acute postoperative blood loss anemia -H&H dropped from 12.0-8.6-7.5-6.9 on 10/08/2020 -Transfused 1 unit -Hemoglobin responded appropriately -Stable time of discharge  Urinary retention, resolved: -Patient had urinary retention issues prior to the surgery. -Had Foley placed prior to the surgery which DC'd on 11/15. -She continues to have problem with urinary retention.  - I &O cath done multiple times. -Foley catheter was placed discontinued 10/10/2020.  Voiding trial successful.  Patient able to void spontaneously.  Hypertension:Stable -Continue amlodipine.  Dementia: -Continue Aricept and Namenda  Hyperlipidemia:Statin discontinued as patient has not been taking.  She was only on low-dose 10mg  atorvastatin daily.  Can revisit this with her primary care physician at next visit  Obesity with BMI of 30: -Diet modification/exercise and weight loss recommended.  Constipation, resolved Likely in the setting of pain medications -Continue with MiraLAX and stool softeners.  MiraLAX increased to twice daily -Is passing gas however has not had bowel movement yet. -Minimize narcotics -Bowel regimen recommended on discharge   Discharge Instructions  Discharge Instructions    Diet - low sodium heart healthy   Complete by: As directed    Increase activity slowly   Complete by: As directed    No wound care   Complete by: As directed      Allergies as of 10/11/2020   No Known Allergies     Medication List    STOP taking these medications   Vitamin D (Cholecalciferol) 25 MCG (1000 UT) Caps Replaced by: cholecalciferol 25 MCG (1000 UNIT) tablet     TAKE these medications   acetaminophen 325 MG tablet Commonly known as: TYLENOL Take 2 tablets (650 mg total) by mouth every 6 (six) hours as needed for mild pain.   amLODipine 5 MG tablet Commonly  known as: NORVASC Take 1 tablet by mouth once  daily   bisacodyl 10 MG suppository Commonly known as: DULCOLAX Place 1 suppository (10 mg total) rectally daily as needed for moderate constipation.   cholecalciferol 25 MCG (1000 UNIT) tablet Commonly known as: VITAMIN D Take 1 tablet (1,000 Units total) by mouth daily. Start taking on: October 12, 2020 Replaces: Vitamin D (Cholecalciferol) 25 MCG (1000 UT) Caps   donepezil 5 MG tablet Commonly known as: ARICEPT Take 5 mg by mouth daily.   enoxaparin 40 MG/0.4ML injection Commonly known as: LOVENOX Inject 0.4 mLs (40 mg total) into the skin daily for 14 days. Start taking on: October 12, 2020   gabapentin 300 MG capsule Commonly known as: NEURONTIN Take 1 capsule (300 mg total) by mouth 3 (three) times daily.   memantine 5 MG tablet Commonly known as: NAMENDA Take 5 mg by mouth 2 (two) times daily.   multivitamin tablet Take 1 tablet by mouth daily.   oxyCODONE 5 MG immediate release tablet Commonly known as: Oxy IR/ROXICODONE Take 1 tablet (5 mg total) by mouth every 4 (four) hours as needed for up to 3 days for moderate pain. For SNF use only   polyethylene glycol 17 g packet Commonly known as: MIRALAX / GLYCOLAX Take 17 g by mouth daily as needed.   senna-docusate 8.6-50 MG tablet Commonly known as: Senokot-S Take 1 tablet by mouth 2 (two) times daily.       Follow-up Information    Corky Downs, MD. Schedule an appointment as soon as possible for a visit in 1 week(s).   Specialties: Internal Medicine, Cardiology Contact information: [address] [place] [address] [phone]        Juanell Fairly, MD. Schedule an appointment as soon as possible for a visit in 2 week(s).   Specialty: Orthopedic Surgery Why: Followup in 10-14 days Contact information: [address] [place] [address] [phone]              No Known Allergies  Consultations:  Orthopedics   Procedures/Studies: DG Chest 1 View  Result Date:  10/05/2020 CLINICAL DATA:  Post fall. EXAM: CHEST  1 VIEW COMPARISON:  February 18, 2016 FINDINGS: Apparent contour irregularity of the right heart border and right mediastinum. No evidence of cardiomegaly. There is no evidence of focal airspace consolidation, pleural effusion or pneumothorax. Osseous structures are without acute abnormality. Soft tissues are grossly normal. IMPRESSION: Apparent contour irregularity of the right heart border and right mediastinum. This may be projectional however it may represent a mediastinal mass or lymphadenopathy. Further evaluation with CT of the chest with contrast, with clinically feasible, is recommended. Electronically Signed   By: Ted Mcalpine M.D.   On: 10/05/2020 14:51   CT CHEST W CONTRAST  Result Date: 10/05/2020 CLINICAL DATA:  Abnormal chest x-ray. EXAM: CT CHEST WITH CONTRAST TECHNIQUE: Multidetector CT imaging of the chest was performed during intravenous contrast administration. CONTRAST:  110mL OMNIPAQUE IOHEXOL 300 MG/ML  SOLN COMPARISON:  Same day chest radiograph. FINDINGS: Cardiovascular: Scattered atherosclerotic calcifications throughout the aorta. Predominately LEFT-sided coronary artery atherosclerotic calcifications. Asymmetric enlargement of the main pulmonary artery in relation to the ascending thoracic aorta. No pericardial effusion. Heart is at the upper limits of normal in size. Mediastinum/Nodes: No enlarged mediastinal, hilar, or axillary lymph nodes. Thyroid gland, trachea, and esophagus demonstrate no significant findings. Lungs/Pleura: Bibasilar dependent consolidative opacities most consistent with atelectasis. No pleural effusion or pneumothorax. LEFT lower lobe pulmonary nodule measures 6 mm (  series 2, image 61). Upper Abdomen: No acute abnormality.  Cholelithiasis. Musculoskeletal: Remote rib fractures. Age-indeterminate rib fracture of the RIGHT lateral third and fourth rib. Mild degenerative changes of the thoracic spine. 1  IMPRESSION: 1. Age-indeterminate rib fracture of the RIGHT lateral third and fourth rib. Correlate for point tenderness. 2. Asymmetric enlargement of the main pulmonary artery in relation to the ascending thoracic aorta, which can be seen with pulmonary arterial hypertension. This may account for the contour abnormality on chest radiograph. 3. 6 mm LEFT lower lobe pulmonary nodule. Non-contrast chest CT at 6-12 months is recommended. If the nodule is stable at time of repeat CT, then future CT at 18-24 months (from today's scan) is considered optional for low-risk patients, but is recommended for high-risk patients. This recommendation follows the consensus statement: Guidelines for Management of Incidental Pulmonary Nodules Detected on CT Images: From the Fleischner Society 2017; Radiology 2017; 284:228-243. Aortic Atherosclerosis (ICD10-I70.0). Electronically Signed   By: Meda KlinefelterStephanie  Peacock MD   On: 10/05/2020 17:34   DG C-Arm 1-60 Min  Result Date: 10/06/2020 CLINICAL DATA:  Right femur fracture, ORIF EXAM: DG C-ARM 1-60 MIN; RIGHT FEMUR 2 VIEWS COMPARISON:  COMPARISON Hip radiograph 10/05/2020 FINDINGS: Fluoroscopic spot images demonstrate femoral IM nail and hip screw fixation of a intertrochanteric fracture. Separate lesser trochanteric fragment. Single interlocking screw distally in the IM nail. No complicating feature. IMPRESSION: 1. ORIF of a right intertrochanteric fracture. Electronically Signed   By: Gaylyn RongWalter  Liebkemann M.D.   On: 10/06/2020 15:21   DG Hip Unilat W or Wo Pelvis 2-3 Views Right  Result Date: 10/05/2020 CLINICAL DATA:  Post fall with rotation and shortening of the right leg. EXAM: DG HIP (WITH OR WITHOUT PELVIS) 2-3V RIGHT COMPARISON:  None. FINDINGS: There is a comminuted impacted fracture of the intratrochanteric portion of the right femur with butterfly fragment consisting of the lesser trochanter. Superior displacement of the distal fracture fragment. The right femoral head is  normally located within the acetabulum. Mild osteoarthritic changes of the left hip. IMPRESSION: Comminuted impacted fracture of the intratrochanteric portion of the right femur. Electronically Signed   By: Ted Mcalpineobrinka  Dimitrova M.D.   On: 10/05/2020 14:49   DG FEMUR, MIN 2 VIEWS RIGHT  Result Date: 10/06/2020 CLINICAL DATA:  Right femur fracture, ORIF EXAM: DG C-ARM 1-60 MIN; RIGHT FEMUR 2 VIEWS COMPARISON:  COMPARISON Hip radiograph 10/05/2020 FINDINGS: Fluoroscopic spot images demonstrate femoral IM nail and hip screw fixation of a intertrochanteric fracture. Separate lesser trochanteric fragment. Single interlocking screw distally in the IM nail. No complicating feature. IMPRESSION: 1. ORIF of a right intertrochanteric fracture. Electronically Signed   By: Gaylyn RongWalter  Liebkemann M.D.   On: 10/06/2020 15:21   DG FEMUR, MIN 2 VIEWS RIGHT  Result Date: 10/06/2020 CLINICAL DATA:  Right femur IM nail EXAM: RIGHT FEMUR 2 VIEWS COMPARISON:  Hip radiograph 10/05/2020 FINDINGS: Right femoral IM nail with hip screw noted, the intramedullary nail is long stem extending to the distal metadiaphysis, with a single distal interlocking screw. Near anatomic alignment of the intertrochanteric fracture aside from the mildly displaced lesser trochanteric fragment. IMPRESSION: 1. Right femoral IM nail placement for ORIF of intertrochanteric fracture. Near anatomic alignment aside from the mildly displaced lesser trochanteric fragment. Electronically Signed   By: Gaylyn RongWalter  Liebkemann M.D.   On: 10/06/2020 15:19    (Echo, Carotid, EGD, Colonoscopy, ERCP)    Subjective: Patient seen and examined on day of discharge.  Still in substantial pain though seems to be improving over interval.  Was able to have a bowel movement today.  Urinating spontaneously.  Stable for discharge to skilled nursing facility.  Discharge Exam: Vitals:   10/11/20 0737 10/11/20 1131  BP: (!) 144/52 (!) 133/57  Pulse: 83 92  Resp: 18 16  Temp:  98.4 F (36.9 C) 98.2 F (36.8 C)  SpO2: 98% 95%   Vitals:   10/11/20 0032 10/11/20 0433 10/11/20 0737 10/11/20 1131  BP: (!) 129/51 (!) 145/55 (!) 144/52 (!) 133/57  Pulse: 84 74 83 92  Resp: 17 16 18 16   Temp: 98.5 F (36.9 C) 98 F (36.7 C) 98.4 F (36.9 C) 98.2 F (36.8 C)  TempSrc: Oral     SpO2: 94% 98% 98% 95%  Weight:      Height:        General: Pt is alert, awake, not in acute distress Cardiovascular: RRR, S1/S2 +, no rubs, no gallops Respiratory: CTA bilaterally, no wheezing, no rhonchi Abdominal: Soft, NT, ND, bowel sounds + Extremities: Mild edema right lower extremity.  Good pedal pulses bilaterally.  Limited range of motion bilaterally.    The results of significant diagnostics from this hospitalization (including imaging, microbiology, ancillary and laboratory) are listed below for reference.     Microbiology: Recent Results (from the past 240 hour(s))  Respiratory Panel by RT PCR (Flu A&B, Covid) - Nasopharyngeal Swab     Status: None   Collection Time: 10/05/20  2:56 PM   Specimen: Nasopharyngeal Swab  Result Value Ref Range Status   SARS Coronavirus 2 by RT PCR NEGATIVE NEGATIVE Final    Comment: (NOTE) SARS-CoV-2 target nucleic acids are NOT DETECTED.  The SARS-CoV-2 RNA is generally detectable in upper respiratoy specimens during the acute phase of infection. The lowest concentration of SARS-CoV-2 viral copies this assay can detect is 131 copies/mL. A negative result does not preclude SARS-Cov-2 infection and should not be used as the sole basis for treatment or other patient management decisions. A negative result may occur with  improper specimen collection/handling, submission of specimen other than nasopharyngeal swab, presence of viral mutation(s) within the areas targeted by this assay, and inadequate number of viral copies (<131 copies/mL). A negative result must be combined with clinical observations, patient history, and  epidemiological information. The expected result is Negative.  Fact Sheet for Patients:  10/07/20  Fact Sheet for Healthcare Providers:  https://www.moore.com/  This test is no t yet approved or cleared by the https://www.young.biz/ FDA and  has been authorized for detection and/or diagnosis of SARS-CoV-2 by FDA under an Emergency Use Authorization (EUA). This EUA will remain  in effect (meaning this test can be used) for the duration of the COVID-19 declaration under Section 564(b)(1) of the Act, 21 U.S.C. section 360bbb-3(b)(1), unless the authorization is terminated or revoked sooner.     Influenza A by PCR NEGATIVE NEGATIVE Final   Influenza B by PCR NEGATIVE NEGATIVE Final    Comment: (NOTE) The Xpert Xpress SARS-CoV-2/FLU/RSV assay is intended as an aid in  the diagnosis of influenza from Nasopharyngeal swab specimens and  should not be used as a sole basis for treatment. Nasal washings and  aspirates are unacceptable for Xpert Xpress SARS-CoV-2/FLU/RSV  testing.  Fact Sheet for Patients: [place]  Fact Sheet for Healthcare Providers: https://www.moore.com/  This test is not yet approved or cleared by the https://www.young.biz/ FDA and  has been authorized for detection and/or diagnosis of SARS-CoV-2 by  FDA under an Emergency Use Authorization (EUA). This EUA will remain  in effect (meaning this test can be used) for the duration of the  Covid-19 declaration under Section 564(b)(1) of the Act, 21  U.S.C. section 360bbb-3(b)(1), unless the authorization is  terminated or revoked. Performed at [facility], [address], [place], [place] [address]   Surgical pcr screen     Status: Abnormal   Collection Time: 10/06/20  9:50 AM   Specimen: Nasal Mucosa; Nasal Swab  Result Value Ref Range Status   MRSA, PCR NEGATIVE NEGATIVE Final   Staphylococcus aureus POSITIVE (A)  NEGATIVE Final    Comment: (NOTE) The Xpert SA Assay (FDA approved for NASAL specimens in patients 79 years of age and older), is one component of a comprehensive surveillance program. It is not intended to diagnose infection nor to guide or monitor treatment. Performed at [facility], [address] [place], [place] [address]      Labs: BNP (last 3 results) No results for input(s): BNP in the last 8760 hours. Basic Metabolic Panel: Recent Labs  Lab 10/06/20 0414 10/07/20 0449 10/08/20 0443 10/09/20 0603 10/11/20 0818  NA 136 139 138 140 141  K 4.7 3.7 3.6 3.8 3.4*  CL 105 105 108 105 109  CO2 GLUCOSE 135* 127* 93 102* 97  BUN CREATININE 0.84 0.76 0.55 0.62 0.56  CALCIUM 8.7* 8.4* 7.8* 8.1* 8.4*   Liver Function Tests: No results for input(s): AST, ALT, ALKPHOS, BILITOT, PROT, ALBUMIN in the last 168 hours. No results for input(s): LIPASE, AMYLASE in the last 168 hours. No results for input(s): AMMONIA in the last 168 hours. CBC: Recent Labs  Lab 10/05/20 1405 10/06/20 0414 10/07/20 0449 10/07/20 0449 10/08/20 0443 10/08/20 1018 10/08/20 1924 10/09/20 0603 10/11/20 0818  WBC 9.9   < > 9.1  --  8.3 8.8  --  8.0 6.8  NEUTROABS 6.2  --   --   --   --   --   --   --  4.3  HGB 12.0   < > 8.6*   < > 7.5* 6.9* 8.2* 8.4* 8.2*  HCT 36.3   < > 25.1*   < > 22.7* 21.1* 24.3* 25.0* 24.1*  MCV 92.4   < > 94.0  --  95.0 94.6  --  90.6 91.6  PLT 199   < > 141*  --  125* 121*  --  128* 167   < > = values in this interval not displayed.   Cardiac Enzymes: No results for input(s): CKTOTAL, CKMB, CKMBINDEX, TROPONINI in the last 168 hours. BNP: Invalid input(s): POCBNP CBG: No results for input(s): GLUCAP in the last 168 hours. D-Dimer No results for input(s): DDIMER in the last 72 hours. Hgb A1c No results for input(s): HGBA1C in the last 72 hours. Lipid Profile No results for input(s): CHOL, HDL, LDLCALC, TRIG, CHOLHDL,  LDLDIRECT in the last 72 hours. Thyroid function studies No results for input(s): TSH, T4TOTAL, T3FREE, THYROIDAB in the last 72 hours.  Invalid input(s): FREET3 Anemia work up No results for input(s): VITAMINB12, FOLATE, FERRITIN, TIBC, IRON, RETICCTPCT in the last 72 hours. Urinalysis No results found for: COLORURINE, APPEARANCEUR, LABSPEC, PHURINE, GLUCOSEU, HGBUR, BILIRUBINUR, KETONESUR, PROTEINUR, UROBILINOGEN, NITRITE, LEUKOCYTESUR Sepsis Labs Invalid input(s): PROCALCITONIN,  WBC,  LACTICIDVEN Microbiology Recent Results (from the past 240 hour(s))  Respiratory Panel by RT PCR (Flu A&B, Covid) - Nasopharyngeal Swab     Status: None   Collection Time: 10/05/20  2:56 PM   Specimen: Nasopharyngeal Swab  Result Value Ref Range Status   SARS Coronavirus 2 by RT PCR NEGATIVE NEGATIVE Final    Comment: (NOTE) SARS-CoV-2 target nucleic acids are NOT DETECTED.  The SARS-CoV-2 RNA is generally detectable in upper respiratoy specimens during the acute phase of infection. The lowest concentration of SARS-CoV-2 viral copies this assay can detect is 131 copies/mL. A negative result does not preclude SARS-Cov-2 infection and should not be used as the sole basis for treatment or other patient management decisions. A negative result may occur with  improper specimen collection/handling, submission of specimen other than nasopharyngeal swab, presence of viral mutation(s) within the areas targeted by this assay, and inadequate number of viral copies (<131 copies/mL). A negative result must be combined with clinical observations, patient history, and epidemiological information. The expected result is Negative.  Fact Sheet for Patients:  https://www.moore.com/  Fact Sheet for Healthcare Providers:  https://www.young.biz/  This test is no t yet approved or cleared by the [place] FDA and  has been authorized for detection and/or diagnosis of  SARS-CoV-2 by FDA under an Emergency Use Authorization (EUA). This EUA will remain  in effect (meaning this test can be used) for the duration of the COVID-19 declaration under Section 564(b)(1) of the Act, 21 U.S.C. section 360bbb-3(b)(1), unless the authorization is terminated or revoked sooner.     Influenza A by PCR NEGATIVE NEGATIVE Final   Influenza B by PCR NEGATIVE NEGATIVE Final    Comment: (NOTE) The Xpert Xpress SARS-CoV-2/FLU/RSV assay is intended as an aid in  the diagnosis of influenza from Nasopharyngeal swab specimens and  should not be used as a sole basis for treatment. Nasal washings and  aspirates are unacceptable for Xpert Xpress SARS-CoV-2/FLU/RSV  testing.  Fact Sheet for Patients: https://www.moore.com/  Fact Sheet for Healthcare Providers: https://www.young.biz/  This test is not yet approved or cleared by the [place] FDA and  has been authorized for detection and/or diagnosis of SARS-CoV-2 by  FDA under an Emergency Use Authorization (EUA). This EUA will remain  in effect (meaning this test can be used) for the duration of the  Covid-19 declaration under Section 564(b)(1) of the Act, 21  U.S.C. section 360bbb-3(b)(1), unless the authorization is  terminated or revoked. Performed at [facility], [address] [place], [place] [address]   Surgical pcr screen     Status: Abnormal   Collection Time: 10/06/20  9:50 AM   Specimen: Nasal Mucosa; Nasal Swab  Result Value Ref Range Status   MRSA, PCR NEGATIVE NEGATIVE Final   Staphylococcus aureus POSITIVE (A) NEGATIVE Final    Comment: (NOTE) The Xpert SA Assay (FDA approved for NASAL specimens in patients 61 years of age and older), is one component of a comprehensive surveillance program. It is not intended to diagnose infection nor to guide or monitor treatment. Performed at [facility], [address], [place], [place] [address]       Time coordinating discharge: Over 30 minutes  SIGNED:   Tresa Moore, MD  [facility] 10/11/2020, 3:10 PM Pager   If 7PM-7AM, please contact night-coverage

## 2020-10-11 NOTE — Progress Notes (Signed)
Writer attempted to call report to [facility] accepting nurse is Selena Batten she was unavailable left a voicemail with writer's direct number for call back to give report.

## 2020-10-11 NOTE — Care Management Important Message (Signed)
Important Message  Patient Details  Name: Cheyenne Parker MRN: [id number] Date of Birth: 12/10/1945   Medicare Important Message Given:  Yes     Johnell Comings 10/11/2020, 10:51 AM

## 2020-10-11 NOTE — Progress Notes (Signed)
Occupational Therapy Treatment Patient Details Name: Cheyenne Parker MRN: [id number] DOB: 03/04/1946 Today's Date: 10/11/2020    History of present illness Pt is a 74 y.o. female presenting to hospital 11/13 s/p mechanical fall walking down basement steps.  Pt s/p 11/14 intramedullary fixation of R comminuted intertrochanteric hip fx.  Imaging also showing age-indeterminate rib fx of R lateral 3rd and 4th ribs.  PMH includes anemia, IFG, obesity, osteoporosis, dementia.   OT comments  Cheyenne Parker was seen for OT treatment on this date for follow-up re safe use of AE for LB ADL management. Upon arrival to room pt supine, appears to be sleeping in bed, but wakes to minimal VCs. Pt endorses 5/10 pain and agreeable to bed-level session. She declines OOB/EOB activity this date 2/2 fatigue. OT provides pt/caregiver (husband present at bedside t/o session) with education/demonstration of safe use of sock aid and long handle reacher for LB dressing tasks. Falls prevention education provided t/o session. Pt with questions regarding OT in STR setting. Questions answered with role of OT in acute vs rehab setting described. Pt/caregiver return verbalize understanding, and handout provided, however would continue to benefit from further reinforcement of education and opportunity to trial AE. Pt making progressing toward OT goals and continues to benefit from skilled OT services to maximize return to PLOF and minimize risk of future falls, injury, caregiver burden, and readmission. Will continue to follow POC as written. Discharge recommendation remains appropriate.    Follow Up Recommendations  SNF    Equipment Recommendations  Other (comment) (Defer to next level of care.)    Recommendations for Other Services      Precautions / Restrictions Precautions Precautions: Fall Restrictions Weight Bearing Restrictions: Yes RLE Weight Bearing: Weight bearing as tolerated       Mobility Bed Mobility Overal bed  mobility: Needs Assistance             General bed mobility comments: Deferred. Pt declines all OOB/EOB activity this date siting fatigue from recent PT sessions.  Transfers Overall transfer level: Needs assistance               General transfer comment: Deferred. Pt declines all OOB/EOB activity this date siting fatigue from recent PT sessions.    Balance Overall balance assessment: Needs assistance Sitting-balance support: Feet supported;Bilateral upper extremity supported Sitting balance-Leahy Scale: Good Sitting balance - Comments: Pt able to achieve long sitting in bed w/o back support briefly during session using bilat bed rails to maintain upright position.                                   ADL either performed or assessed with clinical judgement   ADL Overall ADL's : Needs assistance/impaired                                       General ADL Comments: Pt continues to require MAX A for STS LB ADL management. Mobility has improved since initial evaluation, however significant assist and effort required for functional mobility and LB ADL tasks.     Vision Patient Visual Report: No change from baseline     Perception     Praxis      Cognition Arousal/Alertness: Awake/alert Behavior During Therapy: WFL for tasks assessed/performed Overall Cognitive Status: Within Functional Limits for tasks assessed  General Comments: Pleasant, cooperative, generally able to follow conversation/1-step VCs during session.        Exercises Other Exercises Other Exercises: OT facilitates pt/caregiver education on safe use of AE for LB ADL management including demonstration of use of LH reacher and sock aid to don LB clothing this date. Pt declines opportunity to trial. Would benefit from reinforcement.   Shoulder Instructions       General Comments      Pertinent Vitals/ Pain       Pain  Assessment: 0-10 Pain Score: 5  Pain Location: R hip/thigh Pain Descriptors / Indicators: Discomfort;Grimacing;Guarding;Sore Pain Intervention(s): Limited activity within patient's tolerance;Monitored during session  Home Living Family/patient expects to be discharged to:: Private residence Living Arrangements: Spouse/significant other                                      Prior Functioning/Environment              Frequency  Min 1X/week        Progress Toward Goals  OT Goals(current goals can now be found in the care plan section)  Progress towards OT goals: Progressing toward goals  Acute Rehab OT Goals Patient Stated Goal: return to PLOF OT Goal Formulation: With patient Time For Goal Achievement: 10/21/20 Potential to Achieve Goals: Good  Plan Discharge plan remains appropriate;Frequency remains appropriate    Co-evaluation                 AM-PAC OT "6 Clicks" Daily Activity     Outcome Measure   Help from another person eating meals?: None Help from another person taking care of personal grooming?: A Little Help from another person toileting, which includes using toliet, bedpan, or urinal?: A Lot Help from another person bathing (including washing, rinsing, drying)?: A Lot Help from another person to put on and taking off regular upper body clothing?: A Little Help from another person to put on and taking off regular lower body clothing?: A Lot 6 Click Score: 16    End of Session Equipment Utilized During Treatment: Gait belt;Rolling walker  OT Visit Diagnosis: Unsteadiness on feet (R26.81);Muscle weakness (generalized) (M62.81);Pain Pain - Right/Left: Right Pain - part of body: Hip   Activity Tolerance Patient tolerated treatment well   Patient Left in bed;with call bell/phone within reach;with nursing/sitter in room   Nurse Communication Mobility status        Time: 5176-1607 OT Time Calculation (min): 17 min  Charges:  OT General Charges $OT Visit: 1 Visit OT Treatments $Therapeutic Activity: 8-22 mins  Rockney Ghee, M.S., OTR/L Ascom: [phone] 10/11/20, 1:40 PM

## 2020-10-11 NOTE — Discharge Instructions (Signed)
Hip Fracture  A hip fracture is a break in the upper part of the thigh bone (femur). This is usually the result of an injury, commonly a fall. What are the causes? This condition may be caused by:  A direct hit or injury (trauma) to the side of the hip, such as from a fall or a car accident. What increases the risk? You are more likely to develop this condition if:  You have poor balance or an unsteady walking pattern (gait). Certain conditions contribute to poor balance, including Parkinson disease and dementia.  You have thinning or weakening of your bones, such as from osteopenia or osteoporosis.  You have cancer that spreads to the leg bones.  You have certain conditions that can weaken your bones, such as thyroid disorders, intestine disorders, or a lack (deficiency) of certain nutrients.  You smoke.  You take certain medicines, such as steroids.  You have a history of broken bones. What are the signs or symptoms? Symptoms of this condition include:  Pain over the injured hip. This is commonly felt on the side of the hip or in the front groin area.  Stiffness, bruising, and swelling over the hip.  Pain with movement of the leg, especially lifting it up. Pain often gets better with rest.  Difficulty or inability to stand, walk, or use the leg to support body weight (put weight on the leg).  The leg rolling outward when lying down.  The affected leg being shorter than the other leg. How is this diagnosed? This condition may be diagnosed based on:  Your symptoms.  A physical exam.  X-rays. These may be done: ? To confirm the diagnosis. ? To determine the type and location of the fracture. ? To check for other injuries.  MRI or CT scans. These may be done if the fracture is not visible on an X-ray. How is this treated? Treatment for this condition depends on the severity and location of your fracture. In most cases, surgery is necessary. Surgery may  involve:  Repairing the fracture with a screw, nail, or rod to hold the bone in place (open reduction and internal fixation, ORIF).  Replacing the damaged parts of the femur with metal implants (hemiarthroplasty or arthroplasty). If your fracture is less severe, or if you are not eligible for surgery, you may have non-surgical treatment. Non-surgical treatment may involve:  Using crutches, a walker, or a wheelchair until your health care provider says that you can support (bear) weight on your hip.  Medicines to help reduce pain and swelling.  Having regular X-rays to monitor your fracture and make sure that it is healing.  Physical therapy. You may need physical therapy after surgery, too. Follow these instructions at home: Activity  Do not use your injured leg to support your body weight until your health care provider says that you can. ? Follow standing and walking restrictions as told by your health care provider. ? Use crutches, a walker, or a wheelchair as directed.  Avoid any activities that cause pain or irritation in your hip. Ask your health care provider what activities are safe for you.  Do not drive or use heavy machinery until your health care provider approves.  If physical therapy was prescribed, do exercises as told by your health care provider. General instructions  Take over-the-counter and prescription medicines only as told by your health care provider.  If directed, put ice on the injured area: ? Put ice in a plastic bag. ?   Place a towel between your skin and the bag. ? Leave the ice on for 20 minutes, 2-3 times a day.  Do not use any products that contain nicotine or tobacco, such as cigarettes and e-cigarettes. These can delay bone healing. If you need help quitting, ask your health care provider.  Keep all follow-up visits as told by your health care provider. This is important. How is this prevented?  To prevent falls at home: ? Use a cane, walker,  or wheelchair as directed. ? Make sure your rooms and hallways are free of clutter, obstacles, and cords. ? Install grab bars in your bedroom and bathrooms. ? Always use handrails when going up and down stairs. ? Use nightlights around the house.  Exercise regularly. Ask what forms of exercise are safe for you, such as walking and strength and balance exercises.  Visit an eye doctor regularly to have your eyesight checked. This can help prevent falls.  Make sure you get enough calcium and vitamin D.  Do not use any products that contain nicotine or tobacco, such as cigarettes and e-cigarettes. If you need help quitting, ask your health care provider.  Limit alcohol use.  If you have an underlying condition that caused your hip fracture, work with your health care provider to manage your condition. Contact a health care provider if:  Your pain gets worse or it does not get better with rest or medicine.  You develop any of the following in your leg or foot: ? Numbness. ? Tingling. ? A change in skin color (discoloration). ? Skin feeling cold to the touch. Get help right away if:  Your pain suddenly gets worse.  You cannot move your hip. Summary  A hip fracture is a break in the upper part of the thigh bone (femur).  Treatment typically require surgical management to restore stability and function to the hip.  Pain medicine and icing of the affected leg can help manage pain and swelling. Follow directions as told by your health care provider. This information is not intended to replace advice given to you by your health care provider. Make sure you discuss any questions you have with your health care provider. Document Revised: 07/30/2018 Document Reviewed: 12/12/2016 Elsevier Patient Education  2020 [employer]  

## 2020-10-11 NOTE — Progress Notes (Signed)
Patient is stable and ready for discharge. Patient is going to [facility]. Patient's IV removed. Patient's husband was at bedside and he took patient's belongings and her flowers were taken by transporters (EMS). Writer attempted to call report x3 and unable to give report. Facility stated Selena Batten, Charity fundraiser is receiving Training and development officer left call back number for her to call for writer to give report. Writer let facility know patient is on her way [facility] present at this time to transport patient.

## 2020-11-11 ENCOUNTER — Ambulatory Visit: Payer: Medicare Other | Admitting: Internal Medicine

## 2021-01-13 ENCOUNTER — Other Ambulatory Visit: Payer: Self-pay | Admitting: Internal Medicine

## 2021-01-13 DIAGNOSIS — I10 Essential (primary) hypertension: Secondary | ICD-10-CM

## 2021-01-20 ENCOUNTER — Encounter: Payer: Self-pay | Admitting: Internal Medicine

## 2021-01-20 ENCOUNTER — Ambulatory Visit (INDEPENDENT_AMBULATORY_CARE_PROVIDER_SITE_OTHER): Payer: Medicare Other | Admitting: Internal Medicine

## 2021-01-20 ENCOUNTER — Other Ambulatory Visit: Payer: Self-pay

## 2021-01-20 VITALS — BP 174/74 | HR 62 | Ht 62.0 in | Wt 162.2 lb

## 2021-01-20 DIAGNOSIS — M810 Age-related osteoporosis without current pathological fracture: Secondary | ICD-10-CM

## 2021-01-20 DIAGNOSIS — Z6835 Body mass index (BMI) 35.0-35.9, adult: Secondary | ICD-10-CM

## 2021-01-20 DIAGNOSIS — R413 Other amnesia: Secondary | ICD-10-CM

## 2021-01-20 DIAGNOSIS — R030 Elevated blood-pressure reading, without diagnosis of hypertension: Secondary | ICD-10-CM

## 2021-01-20 DIAGNOSIS — S72141A Displaced intertrochanteric fracture of right femur, initial encounter for closed fracture: Secondary | ICD-10-CM

## 2021-01-20 DIAGNOSIS — E6609 Other obesity due to excess calories: Secondary | ICD-10-CM

## 2021-01-20 NOTE — Assessment & Plan Note (Signed)
Patient blood pressure iselevated patient denies any chest pain or shortness of breath there is no history of palpitation paroxysmal nocturnal dyspnea patient can walk 100 feet without any problem patient was advised to follow low-salt low-cholesterol diet

## 2021-01-20 NOTE — Assessment & Plan Note (Signed)
Patient was advised to lose weight 

## 2021-01-20 NOTE — Assessment & Plan Note (Signed)
Patient was advised to take vitamin D on a regular basis.

## 2021-01-20 NOTE — Progress Notes (Signed)
Established Patient Office Visit  Subjective:  Patient ID: Cheyenne Parker, female    DOB: [birth year]  Age: 75 y.o. MRN: [id number]  CC:  Chief Complaint  Patient presents with  . Hypertension    Patient's blood pressure is elevated today, patient states that she has not taken her BP meds today    HPI   Patient is known to have high blood pressure she has not taking her medicine today.  She complains of pain in the right hip.  She is also known to have an intertrochanteric fracture of the right femur which was repaired by orthopedic surgeon.  She is known to have osteoporosis mixed hyperlipidemia exogenous obesity intermittent constipation.  She takes Aricept for her Alzheimer's disease.  She does not have any behavior problem.  Cheyenne Parker presents fo rpain in rt hip, hypertension, she forgeot  Her bp  Med  today  Past Medical History:  Diagnosis Date  . Anemia   . Hyperlipidemia   . IFG (impaired fasting glucose)   . Obesity   . Osteoporosis   . Vitamin D deficiency disease     Past Surgical History:  Procedure Laterality Date  . ABDOMINAL HYSTERECTOMY     complete  . FRACTURE SURGERY    . INTRAMEDULLARY (IM) NAIL INTERTROCHANTERIC Right 10/06/2020   Procedure: INTRAMEDULLARY (IM) NAIL INTERTROCHANTRIC;  Surgeon: Juanell Fairly, MD;  Location: [facility] ORS;  Service: Orthopedics;  Laterality: Right;    Family History  Problem Relation Age of Onset  . Hypertension Mother   . Stroke Mother        possibly, died at 58 years of age  . Kidney disease Father   . Diabetes Father   . Heart disease Father   . Hypertension Father   . Alcohol abuse Father   . Alcohol abuse Daughter   . Memory loss Maternal Grandmother        died at age 34    Social History   Socioeconomic History  . Marital status: Married    Spouse name: Not on file  . Number of children: Not on file  . Years of education: Not on file  . Highest education level: Not on file  Occupational History  .  Not on file  Tobacco Use  . Smoking status: Never Smoker  . Smokeless tobacco: Never Used  Vaping Use  . Vaping Use: Never used  Substance and Sexual Activity  . Alcohol use: Yes    Comment: socially  . Drug use: No  . Sexual activity: Not Currently  Other Topics Concern  . Not on file  Social History Narrative  . Not on file   Social Determinants of Health   Financial Resource Strain: Not on file  Food Insecurity: Not on file  Transportation Needs: Not on file  Physical Activity: Not on file  Stress: Not on file  Social Connections: Not on file  Intimate Partner Violence: Not on file     Current Outpatient Medications:  .  acetaminophen (TYLENOL) 325 MG tablet, Take 2 tablets (650 mg total) by mouth every 6 (six) hours as needed for mild pain., Disp: , Rfl:  .  amLODipine (NORVASC) 5 MG tablet, Take 1 tablet by mouth once daily, Disp: 90 tablet, Rfl: 0 .  bisacodyl (DULCOLAX) 10 MG suppository, Place 1 suppository (10 mg total) rectally daily as needed for moderate constipation., Disp: 12 suppository, Rfl: 0 .  cholecalciferol (VITAMIN D) 25 MCG (1000 UNIT) tablet, Take 1 tablet (1,000  Units total) by mouth daily., Disp: , Rfl:  .  donepezil (ARICEPT) 5 MG tablet, Take 5 mg by mouth daily., Disp: , Rfl:  .  gabapentin (NEURONTIN) 300 MG capsule, Take 1 capsule (300 mg total) by mouth 3 (three) times daily., Disp: , Rfl:  .  memantine (NAMENDA) 5 MG tablet, Take 5 mg by mouth 2 (two) times daily., Disp: , Rfl:  .  Multiple Vitamin (MULTIVITAMIN) tablet, Take 1 tablet by mouth daily., Disp: , Rfl:  .  polyethylene glycol (MIRALAX / GLYCOLAX) 17 g packet, Take 17 g by mouth daily as needed., Disp: 14 each, Rfl: 0 .  senna-docusate (SENOKOT-S) 8.6-50 MG tablet, Take 1 tablet by mouth 2 (two) times daily., Disp: , Rfl:  .  enoxaparin (LOVENOX) 40 MG/0.4ML injection, Inject 0.4 mLs (40 mg total) into the skin daily for 14 days., Disp: 5.6 mL, Rfl: 0   No Known  Allergies  ROS Review of Systems  Constitutional: Negative.   HENT: Negative.   Eyes: Negative.   Respiratory: Negative.   Cardiovascular: Negative.   Gastrointestinal: Negative.   Endocrine: Negative.   Genitourinary: Negative.   Musculoskeletal: Negative.   Skin: Negative.   Allergic/Immunologic: Negative.   Neurological: Negative.   Hematological: Negative.   Psychiatric/Behavioral: Negative.   All other systems reviewed and are negative.     Objective:    Physical Exam Vitals reviewed.  Constitutional:      Appearance: Normal appearance.  HENT:     Mouth/Throat:     Mouth: Mucous membranes are moist.  Eyes:     Pupils: Pupils are equal, round, and reactive to light.  Neck:     Vascular: No carotid bruit.  Cardiovascular:     Rate and Rhythm: Normal rate and regular rhythm.     Pulses: Normal pulses.     Heart sounds: Normal heart sounds.  Pulmonary:     Effort: Pulmonary effort is normal.     Breath sounds: Normal breath sounds.  Abdominal:     General: Bowel sounds are normal.     Palpations: Abdomen is soft. There is no hepatomegaly, splenomegaly or mass.     Tenderness: There is no abdominal tenderness.     Hernia: No hernia is present.  Musculoskeletal:        General: No tenderness.     Cervical back: Neck supple.     Right lower leg: No edema.     Left lower leg: No edema.     Comments: Pain in the right hip.  Skin:    Findings: No rash.  Neurological:     Mental Status: She is alert and oriented to person, place, and time.     Motor: No weakness.  Psychiatric:        Mood and Affect: Mood and affect normal.        Behavior: Behavior normal.     BP (!) 174/74   Pulse 62   Ht 5\' 2"  (1.575 m)   Wt 162 lb 3.2 oz (73.6 kg)   BMI 29.67 kg/m  Wt Readings from Last 3 Encounters:  01/20/21 162 lb 3.2 oz (73.6 kg)  10/05/20 164 lb (74.4 kg)  09/10/20 175 lb 8 oz (79.6 kg)     Health Maintenance Due  Topic Date Due  . TETANUS/TDAP  Never  done  . MAMMOGRAM  12/08/2018  . Fecal DNA (Cologuard)  05/12/2020    There are no preventive care reminders to display for this patient.  Lab  Results  Component Value Date   TSH 1.610 09/20/2015   Lab Results  Component Value Date   WBC 6.8 10/11/2020   HGB 8.2 (L) 10/11/2020   HCT 24.1 (L) 10/11/2020   MCV 91.6 10/11/2020   PLT 167 10/11/2020   Lab Results  Component Value Date   NA 141 10/11/2020   K 3.4 (L) 10/11/2020   CO2 25 10/11/2020   GLUCOSE 97 10/11/2020   BUN 16 10/11/2020   CREATININE 0.56 10/11/2020   BILITOT 0.4 12/09/2017   ALKPHOS 55 08/31/2016   AST 13 12/09/2017   ALT 9 12/09/2017   PROT 7.2 12/09/2017   ALBUMIN 4.0 08/31/2016   CALCIUM 8.4 (L) 10/11/2020   ANIONGAP 7 10/11/2020   Lab Results  Component Value Date   CHOL 183 07/20/2019   Lab Results  Component Value Date   HDL 33 (L) 07/20/2019   Lab Results  Component Value Date   LDLCALC 135 (H) 07/20/2019   Lab Results  Component Value Date   TRIG 74 07/20/2019   Lab Results  Component Value Date   CHOLHDL 5.5 07/20/2019   Lab Results  Component Value Date   HGBA1C 5.8 (H) 12/09/2017      Assessment & Plan:   Problem List Items Addressed This Visit      Cardiovascular and Mediastinum   Elevated blood pressure, situational    Patient blood pressure iselevated patient denies any chest pain or shortness of breath there is no history of palpitation paroxysmal nocturnal dyspnea patient can walk 100 feet without any problem patient was advised to follow low-salt low-cholesterol diet        Musculoskeletal and Integument   Osteoporosis    Patient was advised to take vitamin D on a regular basis.      Intertrochanteric fracture of right femur, closed, initial encounter [facility]) - Primary    Patient still has swelling of the right hip is doing better she is following with the orthopedic surgeon with regular basis.  She cannot walk more than 100 feet.        Other   Obesity     Patient was advised to lose weight.      Memory change    Patient is taking her Aricept on a regular basis       Patient was advised to take gabapentin for the pain rather than Advil because is causing the elevated blood pressure    No orders of the defined types were placed in this encounter.   Follow-up: No follow-ups on file.    Corky Downs, MD

## 2021-01-20 NOTE — Assessment & Plan Note (Signed)
Patient still has swelling of the right hip is doing better she is following with the orthopedic surgeon with regular basis.  She cannot walk more than 100 feet.

## 2021-01-20 NOTE — Assessment & Plan Note (Signed)
Patient is taking her Aricept on a regular basis

## 2021-02-17 ENCOUNTER — Ambulatory Visit (INDEPENDENT_AMBULATORY_CARE_PROVIDER_SITE_OTHER): Payer: Medicare Other | Admitting: Internal Medicine

## 2021-02-17 ENCOUNTER — Other Ambulatory Visit: Payer: Self-pay

## 2021-02-17 ENCOUNTER — Encounter: Payer: Self-pay | Admitting: Internal Medicine

## 2021-02-17 ENCOUNTER — Ambulatory Visit: Payer: Medicare Other | Admitting: Internal Medicine

## 2021-02-17 VITALS — BP 138/82 | HR 78 | Ht 62.0 in | Wt 160.2 lb

## 2021-02-17 DIAGNOSIS — E782 Mixed hyperlipidemia: Secondary | ICD-10-CM | POA: Diagnosis not present

## 2021-02-17 DIAGNOSIS — S72141D Displaced intertrochanteric fracture of right femur, subsequent encounter for closed fracture with routine healing: Secondary | ICD-10-CM

## 2021-02-17 DIAGNOSIS — G309 Alzheimer's disease, unspecified: Secondary | ICD-10-CM | POA: Diagnosis not present

## 2021-02-17 DIAGNOSIS — F0280 Dementia in other diseases classified elsewhere without behavioral disturbance: Secondary | ICD-10-CM

## 2021-02-17 DIAGNOSIS — I10 Essential (primary) hypertension: Secondary | ICD-10-CM | POA: Diagnosis not present

## 2021-02-17 DIAGNOSIS — K5909 Other constipation: Secondary | ICD-10-CM

## 2021-02-17 DIAGNOSIS — S72141A Displaced intertrochanteric fracture of right femur, initial encounter for closed fracture: Secondary | ICD-10-CM

## 2021-02-17 NOTE — Assessment & Plan Note (Signed)

## 2021-02-17 NOTE — Assessment & Plan Note (Signed)
Hypercholesterolemia  I advised the patient to follow Mediterranean diet This diet is rich in fruits vegetables and whole grain, and This diet is also rich in fish and lean meat Patient should also eat a handful of almonds or walnuts daily Recent heart study indicated that average follow-up on this kind of diet reduces the cardiovascular mortality by 50 to 70%== 

## 2021-02-17 NOTE — Progress Notes (Signed)
Established Patient Office Visit  Subjective:  Patient ID: Cheyenne Parker, female    DOB: Jul 03, 1946  Age: 75 y.o. MRN: [id number]  CC:  Chief Complaint  Patient presents with  . Hypertension    Hypertension This is a chronic problem. The current episode started more than 1 year ago. The problem has been gradually improving since onset. Pertinent negatives include no malaise/fatigue, neck pain, orthopnea, palpitations, PND or shortness of breath. Risk factors for coronary artery disease include sedentary lifestyle. There are no compliance problems.  There is no history of chronic renal disease, hypercortisolism, hyperparathyroidism or sleep apnea.    Cheyenne Parker presents forcheck up Patient is known to have mild dementia without any behavioral problem.  She also has impaired fasting blood sugar, touch of osteoporosis   intratrochanteric fracture of the right femur which is healing good.  Patient also has obesity with mixed hyperlipidemia.  She denies any chest pain or shortness of breath. Past Medical History:  Diagnosis Date  . Anemia   . Hyperlipidemia   . IFG (impaired fasting glucose)   . Obesity   . Osteoporosis   . Vitamin D deficiency disease     Past Surgical History:  Procedure Laterality Date  . ABDOMINAL HYSTERECTOMY     complete  . FRACTURE SURGERY    . INTRAMEDULLARY (IM) NAIL INTERTROCHANTERIC Right 10/06/2020   Procedure: INTRAMEDULLARY (IM) NAIL INTERTROCHANTRIC;  Surgeon: Juanell Fairly, MD;  Location: [facility] ORS;  Service: Orthopedics;  Laterality: Right;    Family History  Problem Relation Age of Onset  . Hypertension Mother   . Stroke Mother        possibly, died at 59 years of age  . Kidney disease Father   . Diabetes Father   . Heart disease Father   . Hypertension Father   . Alcohol abuse Father   . Alcohol abuse Daughter   . Memory loss Maternal Grandmother        died at age 58    Social History   Socioeconomic History  . Marital  status: Married    Spouse name: Not on file  . Number of children: Not on file  . Years of education: Not on file  . Highest education level: Not on file  Occupational History  . Not on file  Tobacco Use  . Smoking status: Never Smoker  . Smokeless tobacco: Never Used  Vaping Use  . Vaping Use: Never used  Substance and Sexual Activity  . Alcohol use: Yes    Comment: socially  . Drug use: No  . Sexual activity: Not Currently  Other Topics Concern  . Not on file  Social History Narrative  . Not on file   Social Determinants of Health   Financial Resource Strain: Not on file  Food Insecurity: Not on file  Transportation Needs: Not on file  Physical Activity: Not on file  Stress: Not on file  Social Connections: Not on file  Intimate Partner Violence: Not on file     Current Outpatient Medications:  .  acetaminophen (TYLENOL) 325 MG tablet, Take 2 tablets (650 mg total) by mouth every 6 (six) hours as needed for mild pain., Disp: , Rfl:  .  amLODipine (NORVASC) 5 MG tablet, Take 1 tablet by mouth once daily, Disp: 90 tablet, Rfl: 0 .  bisacodyl (DULCOLAX) 10 MG suppository, Place 1 suppository (10 mg total) rectally daily as needed for moderate constipation., Disp: 12 suppository, Rfl: 0 .  cholecalciferol (VITAMIN D)  25 MCG (1000 UNIT) tablet, Take 1 tablet (1,000 Units total) by mouth daily., Disp: , Rfl:  .  donepezil (ARICEPT) 5 MG tablet, Take 5 mg by mouth daily., Disp: , Rfl:  .  gabapentin (NEURONTIN) 300 MG capsule, Take 1 capsule (300 mg total) by mouth 3 (three) times daily., Disp: , Rfl:  .  memantine (NAMENDA) 5 MG tablet, Take 5 mg by mouth 2 (two) times daily., Disp: , Rfl:  .  Multiple Vitamin (MULTIVITAMIN) tablet, Take 1 tablet by mouth daily., Disp: , Rfl:  .  polyethylene glycol (MIRALAX / GLYCOLAX) 17 g packet, Take 17 g by mouth daily as needed., Disp: 14 each, Rfl: 0 .  senna-docusate (SENOKOT-S) 8.6-50 MG tablet, Take 1 tablet by mouth 2 (two) times  daily., Disp: , Rfl:  .  enoxaparin (LOVENOX) 40 MG/0.4ML injection, Inject 0.4 mLs (40 mg total) into the skin daily for 14 days., Disp: 5.6 mL, Rfl: 0   No Known Allergies  ROS Review of Systems  Constitutional: Negative for chills and malaise/fatigue.  Eyes: Negative for itching.  Respiratory: Negative for shortness of breath.   Cardiovascular: Negative for palpitations, orthopnea and PND.  Endocrine: Negative for heat intolerance.  Musculoskeletal: Positive for arthralgias. Negative for neck pain.  Psychiatric/Behavioral: Negative for behavioral problems.      Objective:    Physical Exam Vitals reviewed.  Constitutional:      Appearance: Normal appearance.  HENT:     Mouth/Throat:     Mouth: Mucous membranes are moist.  Eyes:     Pupils: Pupils are equal, round, and reactive to light.  Neck:     Vascular: No carotid bruit.  Cardiovascular:     Rate and Rhythm: Normal rate and regular rhythm.     Pulses: Normal pulses.     Heart sounds: Normal heart sounds.  Pulmonary:     Effort: Pulmonary effort is normal.     Breath sounds: Normal breath sounds.  Abdominal:     General: Bowel sounds are normal.     Palpations: Abdomen is soft. There is no hepatomegaly, splenomegaly or mass.     Tenderness: There is no abdominal tenderness.     Hernia: No hernia is present.  Musculoskeletal:        General: No tenderness.     Cervical back: Neck supple.     Right lower leg: No edema.     Left lower leg: No edema.  Skin:    Findings: No rash.  Neurological:     Mental Status: She is alert and oriented to person, place, and time.     Motor: No weakness.  Psychiatric:        Mood and Affect: Mood and affect normal.        Behavior: Behavior normal.     BP 138/82   Pulse 78   Ht 5\' 2"  (1.575 m)   Wt 160 lb 3.2 oz (72.7 kg)   BMI 29.30 kg/m  Wt Readings from Last 3 Encounters:  02/17/21 160 lb 3.2 oz (72.7 kg)  01/20/21 162 lb 3.2 oz (73.6 kg)  10/05/20 164 lb (74.4  kg)     Health Maintenance Due  Topic Date Due  . TETANUS/TDAP  Never done  . MAMMOGRAM  12/08/2018  . Fecal DNA (Cologuard)  05/12/2020    There are no preventive care reminders to display for this patient.  Lab Results  Component Value Date   TSH 1.610 09/20/2015   Lab Results  Component  Value Date   WBC 6.8 10/11/2020   HGB 8.2 (L) 10/11/2020   HCT 24.1 (L) 10/11/2020   MCV 91.6 10/11/2020   PLT 167 10/11/2020   Lab Results  Component Value Date   NA 141 10/11/2020   K 3.4 (L) 10/11/2020   CO2 25 10/11/2020   GLUCOSE 97 10/11/2020   BUN 16 10/11/2020   CREATININE 0.56 10/11/2020   BILITOT 0.4 12/09/2017   ALKPHOS 55 08/31/2016   AST 13 12/09/2017   ALT 9 12/09/2017   PROT 7.2 12/09/2017   ALBUMIN 4.0 08/31/2016   CALCIUM 8.4 (L) 10/11/2020   ANIONGAP 7 10/11/2020   Lab Results  Component Value Date   CHOL 183 07/20/2019   Lab Results  Component Value Date   HDL 33 (L) 07/20/2019   Lab Results  Component Value Date   LDLCALC 135 (H) 07/20/2019   Lab Results  Component Value Date   TRIG 74 07/20/2019   Lab Results  Component Value Date   CHOLHDL 5.5 07/20/2019   Lab Results  Component Value Date   HGBA1C 5.8 (H) 12/09/2017      Assessment & Plan:   Problem List Items Addressed This Visit      Cardiovascular and Mediastinum   Essential hypertension - Primary    Patient blood pressure is normal patient denies any chest pain or shortness of breath there is no history of palpitation paroxysmal nocturnal dyspnea patient can walkioo yards without any problem patient was advised to follow low-salt low-cholesterol diet  I reviewed the results of Sprint trial  ideally I want to keep systolic blood pressure below 629 mmHg, patient was asked to check blood pressure 3 times a week and give me a report on that.  Patient will be follow-up in 3 months, patient will call me back for any change in the cardiovascular symptoms           Nervous and  Auditory   Dementia without behavioral disturbance (HCC)    Stable on medicine no hallucination or delusions        Musculoskeletal and Integument   Intertrochanteric fracture of right femur, closed, initial encounter (HCC)    Right femur fracture.  Is healing good        Other   Mixed hyperlipidemia    Hypercholesterolemia  I advised the patient to follow Mediterranean diet This diet is rich in fruits vegetables and whole grain, and This diet is also rich in fish and lean meat Patient should also eat a handful of almonds or walnuts daily Recent heart study indicated that average follow-up on this kind of diet reduces the cardiovascular mortality by 50 to 70%==      Constipation    Stable         No orders of the defined types were placed in this encounter.   Follow-up: No follow-ups on file.    Corky Downs, MD

## 2021-02-17 NOTE — Assessment & Plan Note (Signed)
Stable

## 2021-02-17 NOTE — Assessment & Plan Note (Signed)
Right femur fracture.  Is healing good

## 2021-02-17 NOTE — Assessment & Plan Note (Signed)
Stable on medicine no hallucination or delusions

## 2021-04-16 ENCOUNTER — Encounter: Payer: Self-pay | Admitting: Internal Medicine

## 2021-04-16 ENCOUNTER — Ambulatory Visit: Payer: Medicare Other | Admitting: Internal Medicine

## 2021-04-16 ENCOUNTER — Other Ambulatory Visit: Payer: Self-pay

## 2021-04-16 VITALS — BP 136/69 | HR 60 | Ht 62.0 in | Wt 159.8 lb

## 2021-04-16 DIAGNOSIS — R7301 Impaired fasting glucose: Secondary | ICD-10-CM

## 2021-04-16 DIAGNOSIS — F0280 Dementia in other diseases classified elsewhere without behavioral disturbance: Secondary | ICD-10-CM

## 2021-04-16 DIAGNOSIS — R030 Elevated blood-pressure reading, without diagnosis of hypertension: Secondary | ICD-10-CM

## 2021-04-16 DIAGNOSIS — G309 Alzheimer's disease, unspecified: Secondary | ICD-10-CM | POA: Diagnosis not present

## 2021-04-16 DIAGNOSIS — E782 Mixed hyperlipidemia: Secondary | ICD-10-CM | POA: Diagnosis not present

## 2021-04-16 DIAGNOSIS — R413 Other amnesia: Secondary | ICD-10-CM

## 2021-04-17 ENCOUNTER — Encounter: Payer: Self-pay | Admitting: Internal Medicine

## 2021-04-17 NOTE — Assessment & Plan Note (Signed)
Patient was advised to follow low-cholesterol diet 

## 2021-04-17 NOTE — Assessment & Plan Note (Signed)
Dementia is stable without any aggressive behavior or depression.

## 2021-04-17 NOTE — Assessment & Plan Note (Signed)
Blood sugar stable at the present time

## 2021-04-17 NOTE — Progress Notes (Signed)
Established Patient Office Visit  Subjective:  Patient ID: Cheyenne Parker, female    DOB: 17-Nov-1946  Age: 75 y.o. MRN: [id number]  CC:  Chief Complaint  Patient presents with  . Hypertension    2 month follow up    HPI  Cheyenne Parker presents for general physical checkup.  Patient is known to have anemia hyperlipidemia exogenous obesity and osteoporosis.  She also has vitamin D deficiency.  Past Medical History:  Diagnosis Date  . Anemia   . Hyperlipidemia   . IFG (impaired fasting glucose)   . Obesity   . Osteoporosis   . Vitamin D deficiency disease     Past Surgical History:  Procedure Laterality Date  . ABDOMINAL HYSTERECTOMY     complete  . FRACTURE SURGERY    . INTRAMEDULLARY (IM) NAIL INTERTROCHANTERIC Right 10/06/2020   Procedure: INTRAMEDULLARY (IM) NAIL INTERTROCHANTRIC;  Surgeon: Juanell Fairly, MD;  Location: [facility] ORS;  Service: Orthopedics;  Laterality: Right;    Family History  Problem Relation Age of Onset  . Hypertension Mother   . Stroke Mother        possibly, died at 22 years of age  . Kidney disease Father   . Diabetes Father   . Heart disease Father   . Hypertension Father   . Alcohol abuse Father   . Alcohol abuse Daughter   . Memory loss Maternal Grandmother        died at age 49    Social History   Socioeconomic History  . Marital status: Married    Spouse name: Not on file  . Number of children: Not on file  . Years of education: Not on file  . Highest education level: Not on file  Occupational History  . Not on file  Tobacco Use  . Smoking status: Never Smoker  . Smokeless tobacco: Never Used  Vaping Use  . Vaping Use: Never used  Substance and Sexual Activity  . Alcohol use: Yes    Comment: socially  . Drug use: No  . Sexual activity: Not Currently  Other Topics Concern  . Not on file  Social History Narrative  . Not on file   Social Determinants of Health   Financial Resource Strain: Not on file  Food  Insecurity: Not on file  Transportation Needs: Not on file  Physical Activity: Not on file  Stress: Not on file  Social Connections: Not on file  Intimate Partner Violence: Not on file     Current Outpatient Medications:  .  acetaminophen (TYLENOL) 325 MG tablet, Take 2 tablets (650 mg total) by mouth every 6 (six) hours as needed for mild pain., Disp: , Rfl:  .  amLODipine (NORVASC) 5 MG tablet, Take 1 tablet by mouth once daily, Disp: 90 tablet, Rfl: 0 .  bisacodyl (DULCOLAX) 10 MG suppository, Place 1 suppository (10 mg total) rectally daily as needed for moderate constipation., Disp: 12 suppository, Rfl: 0 .  cholecalciferol (VITAMIN D) 25 MCG (1000 UNIT) tablet, Take 1 tablet (1,000 Units total) by mouth daily., Disp: , Rfl:  .  donepezil (ARICEPT) 5 MG tablet, Take 5 mg by mouth daily., Disp: , Rfl:  .  enoxaparin (LOVENOX) 40 MG/0.4ML injection, Inject 0.4 mLs (40 mg total) into the skin daily for 14 days., Disp: 5.6 mL, Rfl: 0 .  gabapentin (NEURONTIN) 300 MG capsule, Take 1 capsule (300 mg total) by mouth 3 (three) times daily., Disp: , Rfl:  .  memantine (NAMENDA) 5 MG  tablet, Take 5 mg by mouth 2 (two) times daily., Disp: , Rfl:  .  Multiple Vitamin (MULTIVITAMIN) tablet, Take 1 tablet by mouth daily., Disp: , Rfl:  .  polyethylene glycol (MIRALAX / GLYCOLAX) 17 g packet, Take 17 g by mouth daily as needed., Disp: 14 each, Rfl: 0 .  senna-docusate (SENOKOT-S) 8.6-50 MG tablet, Take 1 tablet by mouth 2 (two) times daily., Disp: , Rfl:    No Known Allergies  ROS Review of Systems  Constitutional: Negative.   HENT: Negative.   Eyes: Negative.   Respiratory: Negative.   Cardiovascular: Negative.   Gastrointestinal: Negative.   Endocrine: Negative.   Genitourinary: Negative.   Musculoskeletal: Negative.   Skin: Negative.   Allergic/Immunologic: Negative.   Neurological: Negative.   Hematological: Negative.   Psychiatric/Behavioral: Negative.   All other systems reviewed  and are negative.     Objective:    Physical Exam Vitals reviewed.  Constitutional:      Appearance: Normal appearance.  HENT:     Mouth/Throat:     Mouth: Mucous membranes are moist.  Eyes:     Pupils: Pupils are equal, round, and reactive to light.  Neck:     Vascular: No carotid bruit.  Cardiovascular:     Rate and Rhythm: Normal rate and regular rhythm.     Pulses: Normal pulses.     Heart sounds: Normal heart sounds.  Pulmonary:     Effort: Pulmonary effort is normal.     Breath sounds: Normal breath sounds.     Comments: Few wheezing was noted in the upper lung.  Anteriorly Abdominal:     General: Bowel sounds are normal.     Palpations: Abdomen is soft. There is no hepatomegaly, splenomegaly or mass.     Tenderness: There is abdominal tenderness in the right upper quadrant.     Hernia: No hernia is present.  Musculoskeletal:        General: No tenderness.     Cervical back: Neck supple.     Right lower leg: No edema.     Left lower leg: No edema.  Skin:    Findings: No rash.  Neurological:     Mental Status: She is alert and oriented to person, place, and time.     Motor: No weakness.  Psychiatric:        Mood and Affect: Mood and affect normal.        Speech: Speech normal.        Behavior: Behavior normal.        Thought Content: Thought content normal.     BP 136/69   Pulse 60   Ht 5\' 2"  (1.575 m)   Wt 159 lb 12.8 oz (72.5 kg)   BMI 29.23 kg/m  Wt Readings from Last 3 Encounters:  04/16/21 159 lb 12.8 oz (72.5 kg)  02/17/21 160 lb 3.2 oz (72.7 kg)  01/20/21 162 lb 3.2 oz (73.6 kg)     Health Maintenance Due  Topic Date Due  . TETANUS/TDAP  Never done  . Zoster Vaccines- Shingrix (1 of 2) Never done  . MAMMOGRAM  12/08/2018  . Fecal DNA (Cologuard)  05/12/2020    There are no preventive care reminders to display for this patient.  Lab Results  Component Value Date   TSH 1.610 09/20/2015   Lab Results  Component Value Date   WBC 6.8  10/11/2020   HGB 8.2 (L) 10/11/2020   HCT 24.1 (L) 10/11/2020   MCV 91.6  10/11/2020   PLT 167 10/11/2020   Lab Results  Component Value Date   NA 141 10/11/2020   K 3.4 (L) 10/11/2020   CO2 25 10/11/2020   GLUCOSE 97 10/11/2020   BUN 16 10/11/2020   CREATININE 0.56 10/11/2020   BILITOT 0.4 12/09/2017   ALKPHOS 55 08/31/2016   AST 13 12/09/2017   ALT 9 12/09/2017   PROT 7.2 12/09/2017   ALBUMIN 4.0 08/31/2016   CALCIUM 8.4 (L) 10/11/2020   ANIONGAP 7 10/11/2020   Lab Results  Component Value Date   CHOL 183 07/20/2019   Lab Results  Component Value Date   HDL 33 (L) 07/20/2019   Lab Results  Component Value Date   LDLCALC 135 (H) 07/20/2019   Lab Results  Component Value Date   TRIG 74 07/20/2019   Lab Results  Component Value Date   CHOLHDL 5.5 07/20/2019   Lab Results  Component Value Date   HGBA1C 5.8 (H) 12/09/2017      Assessment & Plan:   Problem List Items Addressed This Visit      Cardiovascular and Mediastinum   Elevated blood pressure, situational    Patient blood pressure is normal patient denies any chest pain or shortness of breath there is no history of palpitation or paroxysmal nocturnal dyspnea   patient was advised to follow low-salt low-cholesterol diet    ideally I want to keep systolic blood pressure below 734 mmHg, patient was asked to check blood pressure one times a week and give me a report on that.  Patient will be follow-up in 3 months  or earlier as needed, patient will call me back for any change in the cardiovascular symptoms           Endocrine   IFG (impaired fasting glucose) - Primary    Blood sugar stable at the present time        Nervous and Auditory   Dementia without behavioral disturbance (HCC)    Dementia is stable without any aggressive behavior or depression.        Other   Mixed hyperlipidemia    Patient was advised to follow low-cholesterol diet.      Memory change    Patient has a moderate loss  of memory but states she has been able to perform her daily duties         No orders of the defined types were placed in this encounter.   Follow-up: No follow-ups on file.    Corky Downs, MD

## 2021-04-17 NOTE — Assessment & Plan Note (Signed)
Patient blood pressure is normal patient denies any chest pain or shortness of breath there is no history of palpitation or paroxysmal nocturnal dyspnea   patient was advised to follow low-salt low-cholesterol diet    ideally I want to keep systolic blood pressure below 130 mmHg, patient was asked to check blood pressure one times a week and give me a report on that.  Patient will be follow-up in 3 months  or earlier as needed, patient will call me back for any change in the cardiovascular symptoms    

## 2021-04-17 NOTE — Assessment & Plan Note (Signed)
Patient has a moderate loss of memory but states she has been able to perform her daily duties

## 2021-05-02 ENCOUNTER — Other Ambulatory Visit: Payer: Self-pay | Admitting: Internal Medicine

## 2021-05-02 DIAGNOSIS — I10 Essential (primary) hypertension: Secondary | ICD-10-CM

## 2021-08-03 ENCOUNTER — Other Ambulatory Visit: Payer: Self-pay | Admitting: Internal Medicine

## 2021-08-03 DIAGNOSIS — I10 Essential (primary) hypertension: Secondary | ICD-10-CM

## 2021-09-01 ENCOUNTER — Ambulatory Visit (INDEPENDENT_AMBULATORY_CARE_PROVIDER_SITE_OTHER): Payer: Medicare Other | Admitting: *Deleted

## 2021-09-01 ENCOUNTER — Other Ambulatory Visit: Payer: Self-pay

## 2021-09-01 DIAGNOSIS — Z23 Encounter for immunization: Secondary | ICD-10-CM

## 2021-09-15 ENCOUNTER — Encounter: Payer: Medicare Other | Admitting: Internal Medicine

## 2021-09-17 ENCOUNTER — Other Ambulatory Visit: Payer: Self-pay | Admitting: Internal Medicine

## 2021-09-17 DIAGNOSIS — I10 Essential (primary) hypertension: Secondary | ICD-10-CM

## 2021-09-22 ENCOUNTER — Other Ambulatory Visit: Payer: Self-pay

## 2021-09-22 DIAGNOSIS — I10 Essential (primary) hypertension: Secondary | ICD-10-CM

## 2021-09-22 MED ORDER — AMLODIPINE BESYLATE 5 MG PO TABS: 5.0000 mg | ORAL_TABLET | Freq: Every day | ORAL | 0 refills | Status: AC

## 2021-09-26 ENCOUNTER — Ambulatory Visit (INDEPENDENT_AMBULATORY_CARE_PROVIDER_SITE_OTHER): Payer: Medicare Other

## 2021-09-26 DIAGNOSIS — Z0000 Encounter for general adult medical examination without abnormal findings: Secondary | ICD-10-CM | POA: Diagnosis not present

## 2021-09-26 NOTE — Progress Notes (Signed)
Subjective:   Cheyenne Parker is a 76 y.o. female who presents for Medicare Annual (Subsequent) preventive examination. I discussed the limitations of evaluation and management by telemedicine and the availability of in person appointments. The patient expressed understanding and agreed to proceed.   Visit performed by audio   Patient location: Home  Provider location: Home  Review of Systems    N/A       Objective:    There were no vitals filed for this visit. There is no height or weight on file to calculate BMI.  Advanced Directives 09/26/2021 10/11/2020 10/05/2020 09/23/2017 04/28/2017 11/30/2016 09/21/2016  Does Patient Have a Medical Advance Directive? Yes Yes Yes Yes Yes Yes Yes  Type of Estate agent of [place];Living will Living will;Healthcare Power of Attorney - - Living will;Healthcare Power of [employer] Power of [place];Living will Living will  Does patient want to make changes to medical advance directive? No - Patient declined No - Patient declined No - Patient declined - - - -  Copy of Healthcare Power of Attorney in Chart? - No - copy requested - - - - No - copy requested    Current Medications (verified) Outpatient Encounter Medications as of 09/26/2021  Medication Sig   amLODipine (NORVASC) 5 MG tablet Take 1 tablet (5 mg total) by mouth daily.   cholecalciferol (VITAMIN D) 25 MCG (1000 UNIT) tablet Take 1 tablet (1,000 Units total) by mouth daily.   donepezil (ARICEPT) 5 MG tablet Take 5 mg by mouth daily.   memantine (NAMENDA) 5 MG tablet Take 5 mg by mouth 2 (two) times daily.   Multiple Vitamin (MULTIVITAMIN) tablet Take 1 tablet by mouth daily.   senna-docusate (SENOKOT-S) 8.6-50 MG tablet Take 1 tablet by mouth 2 (two) times daily.   enoxaparin (LOVENOX) 40 MG/0.4ML injection Inject 0.4 mLs (40 mg total) into the skin daily for 14 days.   [DISCONTINUED] acetaminophen (TYLENOL) 325 MG tablet Take 2 tablets (650 mg total) by  mouth every 6 (six) hours as needed for mild pain. (Patient not taking: Reported on 09/26/2021)   [DISCONTINUED] bisacodyl (DULCOLAX) 10 MG suppository Place 1 suppository (10 mg total) rectally daily as needed for moderate constipation. (Patient not taking: Reported on 09/26/2021)   [DISCONTINUED] gabapentin (NEURONTIN) 300 MG capsule Take 1 capsule (300 mg total) by mouth 3 (three) times daily. (Patient not taking: Reported on 09/26/2021)   [DISCONTINUED] polyethylene glycol (MIRALAX / GLYCOLAX) 17 g packet Take 17 g by mouth daily as needed. (Patient not taking: Reported on 09/26/2021)   No facility-administered encounter medications on file as of 09/26/2021.    Allergies (verified) Patient has no known allergies.   History: Past Medical History:  Diagnosis Date   Anemia    Hyperlipidemia    IFG (impaired fasting glucose)    Obesity    Osteoporosis    Vitamin D deficiency disease    Past Surgical History:  Procedure Laterality Date   ABDOMINAL HYSTERECTOMY     complete   FRACTURE SURGERY     INTRAMEDULLARY (IM) NAIL INTERTROCHANTERIC Right 10/06/2020   Procedure: INTRAMEDULLARY (IM) NAIL INTERTROCHANTRIC;  Surgeon: Juanell Fairly, MD;  Location: [facility] ORS;  Service: Orthopedics;  Laterality: Right;   Family History  Problem Relation Age of Onset   Hypertension Mother    Stroke Mother        possibly, died at 41 years of age   Kidney disease Father    Diabetes Father    Heart disease Father  Hypertension Father    Alcohol abuse Father    Alcohol abuse Daughter    Memory loss Maternal Grandmother        died at age 21   Social History   Socioeconomic History   Marital status: Married    Spouse name: Sung Renton   Number of children: Not on file   Years of education: Not on file   Highest education level: 12th grade  Occupational History   Occupation: Retired  Tobacco Use   Smoking status: Never   Smokeless tobacco: Never  Vaping Use   Vaping Use: Never used   Substance and Sexual Activity   Alcohol use: Yes    Comment: socially   Drug use: No   Sexual activity: Not Currently  Other Topics Concern   Not on file  Social History Narrative   Not on file   Social Determinants of Health   Financial Resource Strain: Low Risk    Difficulty of Paying Living Expenses: Not hard at all  Food Insecurity: No Food Insecurity   Worried About Programme researcher, broadcasting/film/video in the Last Year: Never true   Ran Out of Food in the Last Year: Never true  Transportation Needs: No Transportation Needs   Lack of Transportation (Medical): No   Lack of Transportation (Non-Medical): No  Physical Activity: Not on file  Stress: Not on file  Social Connections: Socially Integrated   Frequency of Communication with Friends and Family: More than three times a week   Frequency of Social Gatherings with Friends and Family: Once a week   Attends Religious Services: More than 4 times per year   Active Member of [employer] or Organizations: Yes   Attends Engineer, structural: More than 4 times per year   Marital Status: Married    Tobacco Counseling Counseling given: Not Answered   Clinical Intake:  Pre-visit preparation completed: Yes  Pain : No/denies pain     Diabetes: No  How often do you need to have someone help you when you read instructions, pamphlets, or other written materials from your doctor or pharmacy?: 5 - Always What is the last grade level you completed in school?: 12  Diabetic? No  Interpreter Needed?: No  Information entered by :: Garnet Sierras CMA   Activities of Daily Living In your present state of health, do you have any difficulty performing the following activities: 09/26/2021 10/11/2020  Hearing? N N  Vision? N N  Difficulty concentrating or making decisions? Y N  Comment Pt has dementia, unable to complete -  Walking or climbing stairs? N N  Dressing or bathing? Y N  Doing errands, shopping? Y N  Preparing Food and eating ?  Y -  Using the Toilet? Y -  In the past six months, have you accidently leaked urine? N -  Do you have problems with loss of bowel control? N -  Managing your Medications? Y -  Managing your Finances? Y -  Housekeeping or managing your Housekeeping? Y -  Some recent data might be hidden    Patient Care Team: Corky Downs, MD as PCP - General (Internal Medicine) Dasher, Cliffton Asters, MD (Dermatology) Asencion Islam, DPM as Consulting Physician (Podiatry) Domingo Madeira, OD (Optometry)  Indicate any recent Medical Services you may have received from other than [facility] providers in the past year (date may be approximate).     Assessment:   This is a routine wellness examination for Cheyenne Parker.  Hearing/Vision screen No results  found.  Dietary issues and exercise activities discussed: Current Exercise Habits: The patient does not participate in regular exercise at present, Exercise limited by: psychological condition(s)   Goals Addressed   None    Depression Screen PHQ 2/9 Scores 09/26/2021 09/10/2020 06/06/2018 12/09/2017 09/23/2017 04/28/2017 11/30/2016  PHQ - 2 Score 0 0 0 0 0 0 0  PHQ- 9 Score - - 0 - - - -    Fall Risk Fall Risk  09/26/2021 09/10/2020 06/06/2018 12/09/2017 09/23/2017  Falls in the past year? 1 0 No No Yes  Number falls in past yr: 0 0 - - -  Injury with Fall? 1 0 - - -  Risk for fall due to : History of fall(s);Mental status change - - - -  Follow up Falls evaluation completed;Falls prevention discussed - - - -    FALL RISK PREVENTION PERTAINING TO THE HOME:  Any stairs in or around the home? Yes  If so, are there any without handrails? No  Home free of loose throw rugs in walkways, pet beds, electrical cords, etc? Yes  Adequate lighting in your home to reduce risk of falls? Yes   ASSISTIVE DEVICES UTILIZED TO PREVENT FALLS:  Life alert? No  Use of a cane, walker or w/c? No  Grab bars in the bathroom? No  Shower chair or bench in shower? Yes  Elevated toilet seat  or a handicapped toilet? No   TIMED UP AND GO:  Was the test performed? No .  Length of time to ambulate 10 feet: 0 sec.     Cognitive Function:     6CIT Screen 09/23/2017  What Year? 0 points  What month? 0 points  What time? 0 points  Count back from 20 0 points  Months in reverse 0 points  Repeat phrase 0 points  Total Score 0    Immunizations Immunization History  Administered Date(s) Administered   Fluad Quad(high Dose 65+) 08/22/2020, 09/01/2021   Influenza, High Dose Seasonal PF 09/21/2016, 09/23/2017, 09/20/2018   Influenza,inj,Quad PF,6+ Mos 09/20/2015   Influenza-Unspecified 09/01/2012, 08/18/2013, 08/15/2014   PFIZER(Purple Top)SARS-COV-2 Vaccination 12/05/2019, 12/23/2019, 01/14/2020, 09/10/2020   Pfizer Covid-19 Vaccine Bivalent Booster 55yrs & up 08/23/2020   Pneumococcal Conjugate-13 08/08/2014   Pneumococcal Polysaccharide-23 07/20/2012, 11/23/2012   Zoster, Live 11/23/2013    TDAP status: Due, Education has been provided regarding the importance of this vaccine. Advised may receive this vaccine at local pharmacy or Health Dept. Aware to provide a copy of the vaccination record if obtained from local pharmacy or Health Dept. Verbalized acceptance and understanding.  Flu Vaccine status: Up to date  Pneumococcal vaccine status: Up to date  Covid-19 vaccine status: Completed vaccines  Qualifies for Shingles Vaccine? Yes   Zostavax completed Yes   Shingrix Completed?: Yes  Screening Tests Health Maintenance  Topic Date Due   TETANUS/TDAP  Never done   Zoster Vaccines- Shingrix (1 of 2) Never done   MAMMOGRAM  12/08/2018   Fecal DNA (Cologuard)  05/12/2020   Pneumonia Vaccine 22+ Years old  Completed   INFLUENZA VACCINE  Completed   DEXA SCAN  Completed   COVID-19 Vaccine  Completed   Hepatitis C Screening  Completed   HPV VACCINES  Aged Out    Health Maintenance  Health Maintenance Due  Topic Date Due   TETANUS/TDAP  Never done   Zoster  Vaccines- Shingrix (1 of 2) Never done   MAMMOGRAM  12/08/2018   Fecal DNA (Cologuard)  05/12/2020  Colorectal cancer screening: Type of screening: Cologuard. Completed 2018. Repeat every 3 years Pt declined at this time  Mammogram status: Completed No. Repeat every year Pt declined at this time   Lung Cancer Screening: (Low Dose CT Chest recommended if Age 51-80 years, 30 pack-year currently smoking OR have quit w/in 15years.) does not qualify.   Lung Cancer Screening Referral: No  Additional Screening:  Hepatitis C Screening: does not qualify; Completed No  Vision Screening: Recommended annual ophthalmology exams for early detection of glaucoma and other disorders of the eye. Is the patient up to date with their annual eye exam?  Yes  Who is the provider or what is the name of the office in which the patient attends annual eye exams? Dr. Larence Penning If pt is not established with a provider, would they like to be referred to a provider to establish care? No .   Dental Screening: Recommended annual dental exams for proper oral hygiene  Community Resource Referral / Chronic Care Management: CRR required this visit?  No   CCM required this visit?  No      Plan:     I have personally reviewed and noted the following in the patient's chart:   Medical and social history Use of alcohol, tobacco or illicit drugs  Current medications and supplements including opioid prescriptions.  Functional ability and status Nutritional status Physical activity Advanced directives List of other physicians Hospitalizations, surgeries, and ER visits in previous 12 months Vitals Screenings to include cognitive, depression, and falls Referrals and appointments  In addition, I have reviewed and discussed with patient certain preventive protocols, quality metrics, and best practice recommendations. A written personalized care plan for preventive services as well as general preventive health  recommendations were provided to patient.     Gracelyn Nurse, [place]   09/26/2021   Nurse Notes: This visit was accompanied by Cheyenne Parker. The memory test was not performed today due to patient's dementia. Husband is aware of Cheyenne Parker needed vaccines and verbalizes understanding on how she can obtain them if she chooses to do so.

## 2021-09-27 NOTE — Progress Notes (Signed)
I have reviewed this visit and agree with the documentation.   

## 2021-11-06 ENCOUNTER — Encounter: Payer: Self-pay | Admitting: Internal Medicine

## 2021-11-06 ENCOUNTER — Emergency Department: Payer: Medicare Other

## 2021-11-06 ENCOUNTER — Other Ambulatory Visit: Payer: Self-pay

## 2021-11-06 ENCOUNTER — Inpatient Hospital Stay: Payer: Medicare Other

## 2021-11-06 ENCOUNTER — Inpatient Hospital Stay
Admission: EM | Admit: 2021-11-06 | Discharge: 2021-11-11 | DRG: 481 | Disposition: A | Discharge status: Skilled Nursing Facility | Payer: Medicare Other | Attending: Internal Medicine | Admitting: Internal Medicine

## 2021-11-06 DIAGNOSIS — Z8249 Family history of ischemic heart disease and other diseases of the circulatory system: Secondary | ICD-10-CM | POA: Diagnosis not present

## 2021-11-06 DIAGNOSIS — M810 Age-related osteoporosis without current pathological fracture: Secondary | ICD-10-CM | POA: Diagnosis present

## 2021-11-06 DIAGNOSIS — Y92000 Kitchen of unspecified non-institutional (private) residence as  the place of occurrence of the external cause: Secondary | ICD-10-CM | POA: Diagnosis not present

## 2021-11-06 DIAGNOSIS — Z79899 Other long term (current) drug therapy: Secondary | ICD-10-CM

## 2021-11-06 DIAGNOSIS — S72002A Fracture of unspecified part of neck of left femur, initial encounter for closed fracture: Secondary | ICD-10-CM | POA: Diagnosis present

## 2021-11-06 DIAGNOSIS — G8918 Other acute postprocedural pain: Secondary | ICD-10-CM

## 2021-11-06 DIAGNOSIS — S7222XA Displaced subtrochanteric fracture of left femur, initial encounter for closed fracture: Principal | ICD-10-CM | POA: Diagnosis present

## 2021-11-06 DIAGNOSIS — D696 Thrombocytopenia, unspecified: Secondary | ICD-10-CM | POA: Diagnosis not present

## 2021-11-06 DIAGNOSIS — Z419 Encounter for procedure for purposes other than remedying health state, unspecified: Secondary | ICD-10-CM

## 2021-11-06 DIAGNOSIS — D62 Acute posthemorrhagic anemia: Secondary | ICD-10-CM | POA: Diagnosis not present

## 2021-11-06 DIAGNOSIS — Z20822 Contact with and (suspected) exposure to covid-19: Secondary | ICD-10-CM | POA: Diagnosis present

## 2021-11-06 DIAGNOSIS — D72829 Elevated white blood cell count, unspecified: Secondary | ICD-10-CM | POA: Diagnosis present

## 2021-11-06 DIAGNOSIS — W19XXXA Unspecified fall, initial encounter: Secondary | ICD-10-CM | POA: Diagnosis not present

## 2021-11-06 DIAGNOSIS — F0390 Unspecified dementia without behavioral disturbance: Secondary | ICD-10-CM | POA: Diagnosis present

## 2021-11-06 DIAGNOSIS — I10 Essential (primary) hypertension: Secondary | ICD-10-CM | POA: Diagnosis present

## 2021-11-06 DIAGNOSIS — Z8781 Personal history of (healed) traumatic fracture: Secondary | ICD-10-CM

## 2021-11-06 DIAGNOSIS — E785 Hyperlipidemia, unspecified: Secondary | ICD-10-CM | POA: Diagnosis present

## 2021-11-06 DIAGNOSIS — W010XXA Fall on same level from slipping, tripping and stumbling without subsequent striking against object, initial encounter: Secondary | ICD-10-CM | POA: Diagnosis present

## 2021-11-06 DIAGNOSIS — Y92009 Unspecified place in unspecified non-institutional (private) residence as the place of occurrence of the external cause: Secondary | ICD-10-CM | POA: Diagnosis not present

## 2021-11-06 DIAGNOSIS — S0990XA Unspecified injury of head, initial encounter: Secondary | ICD-10-CM

## 2021-11-06 LAB — PROTIME-INR
INR: 1 (ref 0.8–1.2)
Prothrombin Time: 13.6 seconds (ref 11.4–15.2)

## 2021-11-06 LAB — RESP PANEL BY RT-PCR (FLU A&B, COVID) ARPGX2
Influenza A by PCR: NEGATIVE
Influenza B by PCR: NEGATIVE
SARS Coronavirus 2 by RT PCR: NEGATIVE

## 2021-11-06 LAB — APTT: aPTT: 28 seconds (ref 24–36)

## 2021-11-06 LAB — CBC
HCT: 35.8 % — ABNORMAL LOW (ref 36.0–46.0)
Hemoglobin: 12 g/dL (ref 12.0–15.0)
MCH: 31.4 pg (ref 26.0–34.0)
MCHC: 33.5 g/dL (ref 30.0–36.0)
MCV: 93.7 fL (ref 80.0–100.0)
Platelets: 207 10*3/uL (ref 150–400)
RBC: 3.82 MIL/uL — ABNORMAL LOW (ref 3.87–5.11)
RDW: 12.9 % (ref 11.5–15.5)
WBC: 8.3 10*3/uL (ref 4.0–10.5)
nRBC: 0 % (ref 0.0–0.2)

## 2021-11-06 LAB — BASIC METABOLIC PANEL
Anion gap: 8 (ref 5–15)
BUN: 18 mg/dL (ref 8–23)
CO2: 24 mmol/L (ref 22–32)
Calcium: 9.4 mg/dL (ref 8.9–10.3)
Chloride: 107 mmol/L (ref 98–111)
Creatinine, Ser: 0.65 mg/dL (ref 0.44–1.00)
GFR, Estimated: >60 mL/min (ref >60)
Glucose, Bld: 147 mg/dL — ABNORMAL HIGH (ref 70–99)
Potassium: 3.7 mmol/L (ref 3.5–5.1)
Sodium: 139 mmol/L (ref 135–145)

## 2021-11-06 LAB — TYPE AND SCREEN
ABO/RH(D): A POS
Antibody Screen: NEGATIVE

## 2021-11-06 LAB — TROPONIN I (HIGH SENSITIVITY): Troponin I (High Sensitivity): 3 ng/L (ref <18)

## 2021-11-06 MED ORDER — CEFAZOLIN SODIUM-DEXTROSE 1-4 GM/50ML-% IV SOLN
1.0000 g | INTRAVENOUS | Status: AC
  Administered 2021-11-07: 2 g via INTRAVENOUS
  Filled 2021-11-06: qty 50

## 2021-11-06 MED ORDER — DONEPEZIL HCL 5 MG PO TABS
5.0000 mg | ORAL_TABLET | Freq: Every day | ORAL | Status: DC
  Administered 2021-11-07 – 2021-11-11 (×5): 5 mg via ORAL
  Filled 2021-11-06 (×5): qty 1

## 2021-11-06 MED ORDER — ADULT MULTIVITAMIN W/MINERALS CH
1.0000 | ORAL_TABLET | Freq: Every day | ORAL | Status: DC
  Administered 2021-11-07 – 2021-11-11 (×5): 1 via ORAL
  Filled 2021-11-06 (×5): qty 1

## 2021-11-06 MED ORDER — OXYCODONE-ACETAMINOPHEN 5-325 MG PO TABS
1.0000 | ORAL_TABLET | ORAL | Status: DC | PRN
  Administered 2021-11-06 – 2021-11-11 (×6): 1 via ORAL
  Filled 2021-11-06 (×6): qty 1

## 2021-11-06 MED ORDER — MEMANTINE HCL 5 MG PO TABS
5.0000 mg | ORAL_TABLET | Freq: Two times a day (BID) | ORAL | Status: DC
  Administered 2021-11-06 – 2021-11-11 (×10): 5 mg via ORAL
  Filled 2021-11-06 (×10): qty 1

## 2021-11-06 MED ORDER — METHOCARBAMOL 500 MG PO TABS
500.0000 mg | ORAL_TABLET | Freq: Three times a day (TID) | ORAL | Status: DC | PRN
  Administered 2021-11-06: 20:00:00 500 mg via ORAL
  Filled 2021-11-06 (×2): qty 1

## 2021-11-06 MED ORDER — ACETAMINOPHEN 325 MG PO TABS
650.0000 mg | ORAL_TABLET | Freq: Four times a day (QID) | ORAL | Status: DC | PRN
  Administered 2021-11-06 – 2021-11-09 (×2): 650 mg via ORAL
  Filled 2021-11-06: qty 2

## 2021-11-06 MED ORDER — AMLODIPINE BESYLATE 5 MG PO TABS
5.0000 mg | ORAL_TABLET | Freq: Every day | ORAL | Status: DC
  Administered 2021-11-07 – 2021-11-11 (×5): 5 mg via ORAL
  Filled 2021-11-06 (×5): qty 1

## 2021-11-06 MED ORDER — VITAMIN D3 25 MCG (1000 UNIT) PO TABS
1000.0000 [IU] | ORAL_TABLET | Freq: Every day | ORAL | Status: DC
Start: 2021-11-07 — End: 2021-11-11
  Administered 2021-11-07 – 2021-11-11 (×5): 1000 [IU] via ORAL
  Filled 2021-11-06 (×10): qty 1

## 2021-11-06 MED ORDER — SENNOSIDES-DOCUSATE SODIUM 8.6-50 MG PO TABS
1.0000 | ORAL_TABLET | Freq: Two times a day (BID) | ORAL | Status: DC
  Administered 2021-11-06 – 2021-11-11 (×9): 1 via ORAL
  Filled 2021-11-06 (×9): qty 1

## 2021-11-06 MED ORDER — ENSURE ENLIVE PO LIQD
237.0000 mL | Freq: Two times a day (BID) | ORAL | Status: DC
  Administered 2021-11-08 – 2021-11-11 (×8): 237 mL via ORAL

## 2021-11-06 MED ORDER — SENNOSIDES-DOCUSATE SODIUM 8.6-50 MG PO TABS: 1.0000 | ORAL_TABLET | Freq: Every evening | ORAL | Status: DC | PRN

## 2021-11-06 MED ORDER — ONDANSETRON HCL 4 MG/2ML IJ SOLN: 4.0000 mg | Freq: Three times a day (TID) | INTRAMUSCULAR | Status: DC | PRN

## 2021-11-06 MED ORDER — MORPHINE SULFATE (PF) 2 MG/ML IV SOLN
2.0000 mg | INTRAVENOUS | Status: DC | PRN
  Administered 2021-11-06 – 2021-11-08 (×3): 2 mg via INTRAVENOUS
  Filled 2021-11-06 (×4): qty 1

## 2021-11-06 MED ORDER — POLYETHYLENE GLYCOL 3350 17 G PO PACK: 17.0000 g | PACK | Freq: Every day | ORAL | Status: DC | PRN

## 2021-11-06 MED ORDER — FENTANYL CITRATE PF 50 MCG/ML IJ SOSY
50.0000 ug | PREFILLED_SYRINGE | Freq: Once | INTRAMUSCULAR | Status: AC
  Administered 2021-11-06: 50 ug via INTRAVENOUS
  Filled 2021-11-06: qty 1

## 2021-11-06 MED ORDER — CEFAZOLIN (ANCEF) 1 G IV SOLR: 1.0000 g | INTRAVENOUS | Status: DC

## 2021-11-06 MED ORDER — SODIUM CHLORIDE 0.9 % IV SOLN: INTRAVENOUS | Status: DC

## 2021-11-06 MED ORDER — HYDROMORPHONE HCL 1 MG/ML IJ SOLN
0.5000 mg | Freq: Once | INTRAMUSCULAR | Status: AC
  Administered 2021-11-06: 0.5 mg via INTRAVENOUS
  Filled 2021-11-06: qty 1

## 2021-11-06 MED ORDER — HYDRALAZINE HCL 20 MG/ML IJ SOLN: 5.0000 mg | INTRAMUSCULAR | Status: DC | PRN

## 2021-11-06 NOTE — Consult Note (Signed)
Reason for Consult: Left femoral shaft fracture Referring Physician: Dr. Arline Asp Cheyenne Parker is an 75 y.o. female.  HPI: History is obtained from patient's husband as she has significant confusion.  He reports she was just getting up from the table and had socks on and slipped on the laminate floor and fell on her left side.  She is brought to the emergency room secondary to severe pain and deformity it was found to have a left Femoral shaft fracture comminuted.  She has been ambulatory since she broke her right hip a year ago.  Past Medical History:  Diagnosis Date   Anemia    Hyperlipidemia    IFG (impaired fasting glucose)    Obesity    Osteoporosis    Vitamin D deficiency disease     Past Surgical History:  Procedure Laterality Date   ABDOMINAL HYSTERECTOMY     complete   FRACTURE SURGERY     INTRAMEDULLARY (IM) NAIL INTERTROCHANTERIC Right 10/06/2020   Procedure: INTRAMEDULLARY (IM) NAIL INTERTROCHANTRIC;  Surgeon: Juanell Fairly, MD;  Location: [facility] ORS;  Service: Orthopedics;  Laterality: Right;    Family History  Problem Relation Age of Onset   Hypertension Mother    Stroke Mother        possibly, died at 47 years of age   Kidney disease Father    Diabetes Father    Heart disease Father    Hypertension Father    Alcohol abuse Father    Alcohol abuse Daughter    Memory loss Maternal Grandmother        died at age 34    Social History:  reports that she has never smoked. She has never used smokeless tobacco. She reports current alcohol use. She reports that she does not use drugs.  Allergies: No Known Allergies  Medications: I have reviewed the patient's current medications.  Results for orders placed or performed during the hospital encounter of 11/06/21 (from the past 48 hour(s))  CBC     Status: Abnormal   Collection Time: 11/06/21 11:02 AM  Result Value Ref Range   WBC 8.3 4.0 - 10.5 K/uL   RBC 3.82 (L) 3.87 - 5.11 MIL/uL   Hemoglobin 12.0 12.0 - 15.0  g/dL   HCT 76.2 (L) 83.1 - 51.7 %   MCV 93.7 80.0 - 100.0 fL   MCH 31.4 26.0 - 34.0 pg   MCHC 33.5 30.0 - 36.0 g/dL   RDW 61.6 07.3 - 71.0 %   Platelets 207 150 - 400 K/uL   nRBC 0.0 0.0 - 0.2 %    Comment: Performed at [facility], [address], [place], [place] [address]  Troponin I (High Sensitivity)     Status: None   Collection Time: 11/06/21 11:02 AM  Result Value Ref Range   Troponin I (High Sensitivity) 3 <18 ng/L    Comment: (NOTE) Elevated high sensitivity troponin I (hsTnI) values and significant  changes across serial measurements may suggest ACS but many other  chronic and acute conditions are known to elevate hsTnI results.  Refer to the "Links" section for chest pain algorithms and additional  guidance. Performed at [facility], [address] [place], [place] [address]   Type and screen [facility]     Status: None   Collection Time: 11/06/21 11:15 AM  Result Value Ref Range   ABO/RH(D) A POS    Antibody Screen NEG    Sample Expiration  11/09/2021,2359 Performed at [facility], [address] [place], [place] [address]   Protime-INR     Status: None   Collection Time: 11/06/21 11:15 AM  Result Value Ref Range   Prothrombin Time 13.6 11.4 - 15.2 seconds   INR 1.0 0.8 - 1.2    Comment: (NOTE) INR goal varies based on device and disease states. Performed at [facility], [address] [place], [place] [address]   APTT     Status: None   Collection Time: 11/06/21 11:15 AM  Result Value Ref Range   aPTT 28 24 - 36 seconds    Comment: Performed at [facility], [address] [place], [place] [address]  Basic metabolic panel     Status: Abnormal   Collection Time: 11/06/21 12:02 PM  Result Value Ref Range   Sodium 139 135 - 145 mmol/L   Potassium 3.7 3.5 - 5.1 mmol/L   Chloride 107 98 - 111 mmol/L   CO2 24 22 - 32 mmol/L   Glucose, Bld 147 (H) 70 - 99 mg/dL    Comment:  Glucose reference range applies only to samples taken after fasting for at least 8 hours.   BUN 18 8 - 23 mg/dL   Creatinine, Ser 2.13 0.44 - 1.00 mg/dL   Calcium 9.4 8.9 - 08.6 mg/dL   GFR, Estimated >57 >84 mL/min    Comment: (NOTE) Calculated using the CKD-EPI Creatinine Equation (2021)    Anion gap 8 5 - 15    Comment: Performed at [facility], [address] [place], [place] [address]    CT HEAD WO CONTRAST ( )  Result Date: 11/06/2021 CLINICAL DATA:  75 year old female with history of head trauma after a fall. EXAM: CT HEAD WITHOUT CONTRAST CT CERVICAL SPINE WITHOUT CONTRAST TECHNIQUE: Multidetector CT imaging of the head and cervical spine was performed following the standard protocol without intravenous contrast. Multiplanar CT image reconstructions of the cervical spine were also generated. COMPARISON:  No priors. FINDINGS: CT HEAD FINDINGS Brain: Moderate cerebral atrophy. Patchy and confluent areas of decreased attenuation are noted throughout the deep and periventricular white matter of the cerebral hemispheres bilaterally, compatible with chronic microvascular ischemic disease. Well-defined area of low attenuation in the left basal ganglia, compatible with a small lacunar infarct. Physiologic calcification also noted in the left basal ganglia. No evidence of acute infarction, hemorrhage, hydrocephalus, extra-axial collection or mass lesion/mass effect. Vascular: No hyperdense vessel or unexpected calcification. Skull: Normal. Negative for fracture or focal lesion. Sinuses/Orbits: No acute finding. Other: None. CT CERVICAL SPINE FINDINGS Alignment: Normal. Skull base and vertebrae: No acute fracture. No primary bone lesion or focal pathologic process. Soft tissues and spinal canal: No prevertebral fluid or swelling. No visible canal hematoma. Disc levels: Mild multilevel degenerative disc disease most evident at C6-C7. Mild multilevel facet arthropathy. Upper chest:  Unremarkable. Other: None. IMPRESSION: 1. No evidence of significant acute traumatic injury to the skull, brain or cervical spine. 2. Moderate cerebral atrophy with extensive chronic microvascular ischemic changes in the cerebral white matter and old left basal ganglia lacunar infarct, as above. 3. Multilevel degenerative disc disease and cervical spondylosis, as above. Electronically Signed   By: Trudie Reed M.D.   On: 11/06/2021 12:14   CT Cervical Spine Wo Contrast  Result Date: 11/06/2021 CLINICAL DATA:  75 year old female with history of head trauma after a fall. EXAM: CT HEAD WITHOUT CONTRAST CT CERVICAL SPINE WITHOUT CONTRAST TECHNIQUE: Multidetector CT imaging of the head and cervical  spine was performed following the standard protocol without intravenous contrast. Multiplanar CT image reconstructions of the cervical spine were also generated. COMPARISON:  No priors. FINDINGS: CT HEAD FINDINGS Brain: Moderate cerebral atrophy. Patchy and confluent areas of decreased attenuation are noted throughout the deep and periventricular white matter of the cerebral hemispheres bilaterally, compatible with chronic microvascular ischemic disease. Well-defined area of low attenuation in the left basal ganglia, compatible with a small lacunar infarct. Physiologic calcification also noted in the left basal ganglia. No evidence of acute infarction, hemorrhage, hydrocephalus, extra-axial collection or mass lesion/mass effect. Vascular: No hyperdense vessel or unexpected calcification. Skull: Normal. Negative for fracture or focal lesion. Sinuses/Orbits: No acute finding. Other: None. CT CERVICAL SPINE FINDINGS Alignment: Normal. Skull base and vertebrae: No acute fracture. No primary bone lesion or focal pathologic process. Soft tissues and spinal canal: No prevertebral fluid or swelling. No visible canal hematoma. Disc levels: Mild multilevel degenerative disc disease most evident at C6-C7. Mild multilevel facet  arthropathy. Upper chest: Unremarkable. Other: None. IMPRESSION: 1. No evidence of significant acute traumatic injury to the skull, brain or cervical spine. 2. Moderate cerebral atrophy with extensive chronic microvascular ischemic changes in the cerebral white matter and old left basal ganglia lacunar infarct, as above. 3. Multilevel degenerative disc disease and cervical spondylosis, as above. Electronically Signed   By: Trudie Reed M.D.   On: 11/06/2021 12:14   CT Hip Left Wo Contrast  Result Date: 11/06/2021 CLINICAL DATA:  Evaluate left hip fracture. EXAM: CT OF THE LEFT HIP WITHOUT CONTRAST TECHNIQUE: Multidetector CT imaging of the left hip was performed according to the standard protocol. Multiplanar CT image reconstructions were also generated. COMPARISON:  None. FINDINGS: There is a displaced and angulated subtrochanteric fracture of the left hip. There is also fairly marked shortening. The fracture does involve the lesser trochanter which is able stand displaced up near the upper aspect of the femoral neck. The femoral head is normally located. Minimal degenerative changes. The visualized left hemipelvic bony structures are intact. No acetabular or pubic rami fractures. Moderate degenerative changes noted at the pubic symphysis. There is associated hematoma involving the surrounding hip muscles. No significant intrapelvic abnormalities are identified. IMPRESSION: 1. Displaced, angulated and shortened largely subtrochanteric fracture as detailed above. 2. No pelvic fractures. Electronically Signed   By: Rudie Meyer M.D.   On: 11/06/2021 12:37    Review of Systems Blood pressure (!) 152/72, pulse 85, temperature 97.7 F (36.5 C), temperature source Axillary, resp. rate 16, height 5\' 2"  (1.575 m), weight 72.5 kg, SpO2 97 %. Physical Exam Left leg is significantly shortened and externally rotated.  She has 2+ edema in the lower extremity and it does flex and extend the  toes. Assessment/Plan: Left proximal shaft fracture with large butterfly fragment Plan is for ORIF with IM rod plan on cerclage wires as well to get butterfly fragment anatomic.  That will be done tomorrow.  11/06/2021, 2:02 PM

## 2021-11-06 NOTE — Plan of Care (Signed)
  Problem: Education: Goal: Knowledge of General Education information will improve Description: Including pain rating scale, medication(s)/side effects and non-pharmacologic comfort measures Outcome: Progressing   Problem: Clinical Measurements: Goal: Will remain free from infection Outcome: Progressing Goal: Respiratory complications will improve Outcome: Progressing   Problem: Pain Managment: Goal: General experience of comfort will improve Outcome: Progressing   Problem: Safety: Goal: Ability to remain free from injury will improve Outcome: Progressing   Problem: Skin Integrity: Goal: Risk for impaired skin integrity will decrease Outcome: Progressing   

## 2021-11-06 NOTE — ED Notes (Signed)
Redrew green top and sent to lab per lab request.  Informed provider pt requesting pain medication for 10/10 R hip pain.

## 2021-11-06 NOTE — H&P (Signed)
History and Physical    Cheyenne Parker [id number] DOB: Apr 12, 1946 DOA: 11/06/2021  Referring MD/NP/PA:   PCP: Cletis Athens, MD   Patient coming from:  The patient is coming from home.    Chief Complaint: fall and left hip pain  HPI: Cheyenne Parker is a 75 y.o. female with medical history significant of hypertension, hyperlipidemia, anemia, osteoporosis, vitamin D deficiency, dementia, who presents with fall, left hip pain.  Patient states that she fell accidentally when she slipped in kitchen this morning.  No loss of consciousness.  She developed pain in the left hip, which is constant, sharp, severe, nonradiating.  No leg numbness. She states that she hit her head. No neck injury.  Patient denies chest pain, cough, shortness breath.  No fever or chills.  No nausea vomiting, diarrhea or abdominal pain.  No symptoms of UTI.  ED Course: pt was found to have WBC 8.3, INR 1.0, troponin level 7, pending COVID-19 PCR, GFR > 60, temperature normal, blood pressure 160/68, heart rate 73, RR 16, oxygen saturation 99% on room air.  CT of head is negative for acute intracranial abnormalities.  CT of C-spine is negative for acute injury, but showed degenerative disc disease.  CT of left hip showed displaced, angulated and shortened largely subtrochanteric fracture. Pt is admitted to [facility] bed as inpatient.  Dr. Rudene Christians of Ortho is consulted.   Review of Systems:   General: no fevers, chills, no body weight gain, has fatigue HEENT: no blurry vision, hearing changes or sore throat Respiratory: no dyspnea, coughing, wheezing CV: no chest pain, no palpitations GI: no nausea, vomiting, abdominal pain, diarrhea, constipation GU: no dysuria, burning on urination, increased urinary frequency, hematuria  Ext: no leg edema Neuro: no unilateral weakness, numbness, or tingling, no vision change or hearing loss. Has fall. Skin: no rash, no skin tear. MSK: has left hip pain Heme: No easy bruising.  Travel  history: No recent long distant travel.  Allergy: No Known Allergies  Past Medical History:  Diagnosis Date   Anemia    Hyperlipidemia    IFG (impaired fasting glucose)    Obesity    Osteoporosis    Vitamin D deficiency disease     Past Surgical History:  Procedure Laterality Date   ABDOMINAL HYSTERECTOMY     complete   FRACTURE SURGERY     INTRAMEDULLARY (IM) NAIL INTERTROCHANTERIC Right 10/06/2020   Procedure: INTRAMEDULLARY (IM) NAIL INTERTROCHANTRIC;  Surgeon: Thornton Park, MD;  Location: [facility] ORS;  Service: Orthopedics;  Laterality: Right;    Social History:  reports that she has never smoked. She has never used smokeless tobacco. She reports current alcohol use. She reports that she does not use drugs.  Family History:  Family History  Problem Relation Age of Onset   Hypertension Mother    Stroke Mother        possibly, died at 60 years of age   Kidney disease Father    Diabetes Father    Heart disease Father    Hypertension Father    Alcohol abuse Father    Alcohol abuse Daughter    Memory loss Maternal Grandmother        died at age 38     Prior to Admission medications   Medication Sig Start Date End Date Taking? Authorizing Provider  amLODipine (NORVASC) 5 MG tablet Take 1 tablet (5 mg total) by mouth daily. 09/22/21   Cletis Athens, MD  cholecalciferol (VITAMIN D) 25 MCG (1000 UNIT) tablet  Take 1 tablet (1,000 Units total) by mouth daily. 10/12/20   Sreenath, Sudheer B, MD  donepezil (ARICEPT) 5 MG tablet Take 5 mg by mouth daily. 09/18/20   [provider]  enoxaparin (LOVENOX) 40 MG/0.4ML injection Inject 0.4 mLs (40 mg total) into the skin daily for 14 days. 10/12/20 10/26/20  Sidney Ace, MD  memantine (NAMENDA) 5 MG tablet Take 5 mg by mouth 2 (two) times daily. 07/19/20   [provider]  Multiple Vitamin (MULTIVITAMIN) tablet Take 1 tablet by mouth daily.    [provider]  senna-docusate (SENOKOT-S) 8.6-50 MG  tablet Take 1 tablet by mouth 2 (two) times daily. 10/11/20   Sidney Ace, MD    Physical Exam: Vitals:   11/06/21 1200 11/06/21 1230 11/06/21 1300 11/06/21 1330  BP: (!) 160/68 (!) 158/70 (!) 167/69 (!) 152/72  Pulse: 73 75 82 85  Resp: 16     Temp:      TempSrc:      SpO2: 99% 94% 99% 97%  Weight:      Height:       General: Not in acute distress HEENT:       Eyes: PERRL, EOMI, no scleral icterus.       ENT: No discharge from the ears and nose, no pharynx injection, no tonsillar enlargement.        Neck: No JVD, no bruit, no mass felt. Heme: No neck lymph node enlargement. Cardiac: S1/S2, RRR, No murmurs, No gallops or rubs. Respiratory: No rales, wheezing, rhonchi or rubs. GI: Soft, nondistended, nontender, no rebound pain, no organomegaly, BS present. GU: No hematuria Ext: No pitting leg edema bilaterally. 1+DP/PT pulse bilaterally. Musculoskeletal: has pain in left hip. Left leg is shortened. Skin: No rashes.  Neuro: Alert, oriented X3, cranial nerves II-XII grossly intact, moves all extremities. Psych: Patient is not psychotic, no suicidal or hemocidal ideation.  Labs on Admission: I have personally reviewed following labs and imaging studies  CBC: Recent Labs  Lab 11/06/21 1102  WBC 8.3  HGB 12.0  HCT 35.8*  MCV 93.7  PLT [id number]   Basic Metabolic Panel: Recent Labs  Lab 11/06/21 1202  NA 139  K 3.7  CL 107  CO2 24  GLUCOSE 147*  BUN 18  CREATININE 0.65  CALCIUM 9.4   GFR: Estimated Creatinine Clearance: 57.6 mL/min (by C-G formula based on SCr of 0.65 mg/dL). Liver Function Tests: No results for input(s): AST, ALT, ALKPHOS, BILITOT, PROT, ALBUMIN in the last 168 hours. No results for input(s): LIPASE, AMYLASE in the last 168 hours. No results for input(s): AMMONIA in the last 168 hours. Coagulation Profile: Recent Labs  Lab 11/06/21 1115  INR 1.0   Cardiac Enzymes: No results for input(s): CKTOTAL, CKMB, CKMBINDEX, TROPONINI in the last  168 hours. BNP (last 3 results) No results for input(s): PROBNP in the last 8760 hours. HbA1C: No results for input(s): HGBA1C in the last 72 hours. CBG: No results for input(s): GLUCAP in the last 168 hours. Lipid Profile: No results for input(s): CHOL, HDL, LDLCALC, TRIG, CHOLHDL, LDLDIRECT in the last 72 hours. Thyroid Function Tests: No results for input(s): TSH, T4TOTAL, FREET4, T3FREE, THYROIDAB in the last 72 hours. Anemia Panel: No results for input(s): VITAMINB12, FOLATE, FERRITIN, TIBC, IRON, RETICCTPCT in the last 72 hours. Urine analysis: No results found for: COLORURINE, APPEARANCEUR, LABSPEC, PHURINE, GLUCOSEU, HGBUR, BILIRUBINUR, KETONESUR, PROTEINUR, UROBILINOGEN, NITRITE, LEUKOCYTESUR Sepsis Labs: @LABRCNTIP (procalcitonin:4,lacticidven:4) )No results found for this or any previous visit (from the  past 240 hour(s)).   Radiological Exams on Admission: CT HEAD WO CONTRAST (5MM)  Result Date: 11/06/2021 CLINICAL DATA:  75 year old female with history of head trauma after a fall. EXAM: CT HEAD WITHOUT CONTRAST CT CERVICAL SPINE WITHOUT CONTRAST TECHNIQUE: Multidetector CT imaging of the head and cervical spine was performed following the standard protocol without intravenous contrast. Multiplanar CT image reconstructions of the cervical spine were also generated. COMPARISON:  No priors. FINDINGS: CT HEAD FINDINGS Brain: Moderate cerebral atrophy. Patchy and confluent areas of decreased attenuation are noted throughout the deep and periventricular white matter of the cerebral hemispheres bilaterally, compatible with chronic microvascular ischemic disease. Well-defined area of low attenuation in the left basal ganglia, compatible with a small lacunar infarct. Physiologic calcification also noted in the left basal ganglia. No evidence of acute infarction, hemorrhage, hydrocephalus, extra-axial collection or mass lesion/mass effect. Vascular: No hyperdense vessel or unexpected  calcification. Skull: Normal. Negative for fracture or focal lesion. Sinuses/Orbits: No acute finding. Other: None. CT CERVICAL SPINE FINDINGS Alignment: Normal. Skull base and vertebrae: No acute fracture. No primary bone lesion or focal pathologic process. Soft tissues and spinal canal: No prevertebral fluid or swelling. No visible canal hematoma. Disc levels: Mild multilevel degenerative disc disease most evident at C6-C7. Mild multilevel facet arthropathy. Upper chest: Unremarkable. Other: None. IMPRESSION: 1. No evidence of significant acute traumatic injury to the skull, brain or cervical spine. 2. Moderate cerebral atrophy with extensive chronic microvascular ischemic changes in the cerebral white matter and old left basal ganglia lacunar infarct, as above. 3. Multilevel degenerative disc disease and cervical spondylosis, as above. Electronically Signed   By: Vinnie Langton M.D.   On: 11/06/2021 12:14   CT Cervical Spine Wo Contrast  Result Date: 11/06/2021 CLINICAL DATA:  75 year old female with history of head trauma after a fall. EXAM: CT HEAD WITHOUT CONTRAST CT CERVICAL SPINE WITHOUT CONTRAST TECHNIQUE: Multidetector CT imaging of the head and cervical spine was performed following the standard protocol without intravenous contrast. Multiplanar CT image reconstructions of the cervical spine were also generated. COMPARISON:  No priors. FINDINGS: CT HEAD FINDINGS Brain: Moderate cerebral atrophy. Patchy and confluent areas of decreased attenuation are noted throughout the deep and periventricular white matter of the cerebral hemispheres bilaterally, compatible with chronic microvascular ischemic disease. Well-defined area of low attenuation in the left basal ganglia, compatible with a small lacunar infarct. Physiologic calcification also noted in the left basal ganglia. No evidence of acute infarction, hemorrhage, hydrocephalus, extra-axial collection or mass lesion/mass effect. Vascular: No  hyperdense vessel or unexpected calcification. Skull: Normal. Negative for fracture or focal lesion. Sinuses/Orbits: No acute finding. Other: None. CT CERVICAL SPINE FINDINGS Alignment: Normal. Skull base and vertebrae: No acute fracture. No primary bone lesion or focal pathologic process. Soft tissues and spinal canal: No prevertebral fluid or swelling. No visible canal hematoma. Disc levels: Mild multilevel degenerative disc disease most evident at C6-C7. Mild multilevel facet arthropathy. Upper chest: Unremarkable. Other: None. IMPRESSION: 1. No evidence of significant acute traumatic injury to the skull, brain or cervical spine. 2. Moderate cerebral atrophy with extensive chronic microvascular ischemic changes in the cerebral white matter and old left basal ganglia lacunar infarct, as above. 3. Multilevel degenerative disc disease and cervical spondylosis, as above. Electronically Signed   By: Vinnie Langton M.D.   On: 11/06/2021 12:14   CT Hip Left Wo Contrast  Result Date: 11/06/2021 CLINICAL DATA:  Evaluate left hip fracture. EXAM: CT OF THE LEFT HIP WITHOUT CONTRAST TECHNIQUE: Multidetector CT  imaging of the left hip was performed according to the standard protocol. Multiplanar CT image reconstructions were also generated. COMPARISON:  None. FINDINGS: There is a displaced and angulated subtrochanteric fracture of the left hip. There is also fairly marked shortening. The fracture does involve the lesser trochanter which is able stand displaced up near the upper aspect of the femoral neck. The femoral head is normally located. Minimal degenerative changes. The visualized left hemipelvic bony structures are intact. No acetabular or pubic rami fractures. Moderate degenerative changes noted at the pubic symphysis. There is associated hematoma involving the surrounding hip muscles. No significant intrapelvic abnormalities are identified. IMPRESSION: 1. Displaced, angulated and shortened largely  subtrochanteric fracture as detailed above. 2. No pelvic fractures. Electronically Signed   By: Rudie Meyer M.D.   On: 11/06/2021 12:37     EKG: I have personally reviewed.  Sinus rhythm, QTC 433, nonspecific T wave change  Assessment/Plan Principal Problem:   Closed left hip fracture [facility]) Active Problems:   Essential hypertension   Fall at home, initial encounter   Dementia [facility])  Closed left hip fracture [facility]): As evidenced by CT scan. Patient has sever pain now. No neurovascular compromise. Orthopedic surgeon, Dr. Rosita Kea was consulted, planing to do surgery tomorrow.  - will admit to Med-surg bed - Pain control: morphine prn and percocet - When necessary Zofran for nausea - Robaxin for muscle spasm - type and cross - INR/PTT  Essential hypertension: -IV hydralazine as needed -Continue home amlodipine  Fall at home, initial encounter: - PT/OT when able to (not ordered now)  Dementia (HCC) -Namenda, Aricept   Perioperative Cardiac Risk: pt has multiple comorbidities, as listed above. Currently patient is active and partially independent of ADLs and, IADLs. No hx of CAD or CHF. No recent acute cardiac issues.  Patient does not have chest pain, shortness of breath, palpitation, leg edema.  EKG has no acute change. At this time point, no further work up is needed. Patient's GUPTA score perioperative myocardial infarction or cardaic arrest is 1.39 %. Revised Cardiac Risk Index Nedra Hai Criteria) 0.4%. I discussed the risk with patient and her husband, pt would like to proceed for surgery.    DVT ppx: SCD Code Status: Full code per pt and her husband Family Communication: Yes, patient's husband at bed side.   Disposition Plan:  Anticipate discharge to rehab Consults called:  Dr. Rosita Kea Admission status and  Level of care: Med-Surg:    Med-surg bed for obs as inpt    progressive unit for obs   as inpt      SDU/inpation          Status is: Inpatient  Remains inpatient  appropriate because: Patient has left hip fracture, will need surgery.          Date of Service 11/06/2021    Lorretta Harp [facility]   If 7PM-7AM, please contact night-coverage [email or web address] 11/06/2021, 2:03 PM

## 2021-11-06 NOTE — Progress Notes (Signed)
Initial Nutrition Assessment  DOCUMENTATION CODES:   Not applicable  INTERVENTION:   -Ensure Enlive po BID, each supplement provides 350 kcal and 20 grams of protein  -MVI with minerals daily  NUTRITION DIAGNOSIS:   Increased nutrient needs related to post-op healing as evidenced by estimated needs.  GOAL:   Patient will meet greater than or equal to 90% of their needs  MONITOR:   PO intake, Supplement acceptance, Labs, Weight trends, Skin, I & O's  REASON FOR ASSESSMENT:   Consult Assessment of nutrition requirement/status, Hip fracture protocol  ASSESSMENT:   Cheyenne Parker is a 75 y.o. female with medical history significant of hypertension, hyperlipidemia, anemia, osteoporosis, vitamin D deficiency, dementia, who presents with fall, left hip pain.  Pt admitted with lt femur fracture.   Per orthopedics notes, plan for ORIF tomorrow.   Pt unavailable at time of visit. RD unable to obtain further nutrition-related history or complete nutrition-focused physical exam at this time.    Pt currently on a heart healthy diet. No meal completion data available to assess at this time.   Reviewed wt hx; wt has been stable over the past year.   Pt with increased nutritional needs and would benefit from addition of oral nutrition supplements.    Medications reviewed and include vitamin D and senokot.   Labs reviewed.   Diet Order:   Diet Order             Diet NPO time specified Except for: Ice Chips, Sips with Meds  Diet effective midnight           Diet NPO time specified  Diet effective 0500           Diet Heart Room service appropriate? Yes; Fluid consistency: Thin  Diet effective now                   EDUCATION NEEDS:   No education needs have been identified at this time  Skin:  Skin Assessment: Reviewed RN Assessment  Last BM:  Unknown  Height:   Ht Readings from Last 1 Encounters:  11/06/21 5\' 2"  (1.575 m)    Weight:   Wt Readings from Last  1 Encounters:  11/06/21 72.5 kg    Ideal Body Weight:  50 kg  BMI:  Body mass index is 29.23 kg/m.  Estimated Nutritional Needs:   Kcal:  1600-1800  Protein:  85-100 grams  Fluid:  > 1.6 L    11/08/21, RD, LDN, CDCES Registered Dietitian II Certified Diabetes Care and Education Specialist Please refer to [facility] for RD and/or RD on-call/weekend/after hours pager

## 2021-11-06 NOTE — ED Triage Notes (Signed)
Pt here via [facility] with a fall this morning. Pt hit slipped and fell in her kitchen and injured her L hip, positive rotation. Pt has a hx of a broken hip on the same side 1 year ago. Pt received 100 mcg of fentanyl with ems.   160/62 96% RA 60 233-cbg

## 2021-11-06 NOTE — Progress Notes (Signed)
Patient arrived to unit

## 2021-11-06 NOTE — ED Notes (Signed)
Informed RN bed assigned 

## 2021-11-06 NOTE — ED Provider Notes (Signed)
[facility] Emergency Department Provider Note  ____________________________________________   Event Date/Time   First MD Initiated Contact with Patient 11/06/21 1101     (approximate)  I have reviewed the triage vital signs and the nursing notes.   HISTORY  Chief Complaint Fall    HPI Cheyenne Parker is a 75 y.o. female presents emergency department via EMS after a fall this morning.  Patient is a poor historian.  States that she did slipped and fall.  States she did hit her head.  Had a broken hip on the left side last year.  She denies LOC.  She does not live alone that she is confused about who stays with her but says someone does  Past Medical History:  Diagnosis Date   Anemia    Hyperlipidemia    IFG (impaired fasting glucose)    Obesity    Osteoporosis    Vitamin D deficiency disease     Patient Active Problem List   Diagnosis Date Noted   Closed left hip fracture (HCC) 11/06/2021   Intertrochanteric fracture of right femur, closed, initial encounter (HCC) 10/05/2020   Dementia without behavioral disturbance (HCC) 10/05/2020   Need for COVID-19 vaccine 09/10/2020   Memory change 07/16/2020   Essential hypertension 04/15/2020   Low HDL (under 40) 04/28/2017   Annual physical exam 09/21/2016   Constipation 09/20/2015   Elevated blood pressure, situational 09/20/2015   Osteoporosis    IFG (impaired fasting glucose)    Mixed hyperlipidemia    Hemorrhoid 07/20/2012   Insomnia 07/20/2012    Past Surgical History:  Procedure Laterality Date   ABDOMINAL HYSTERECTOMY     complete   FRACTURE SURGERY     INTRAMEDULLARY (IM) NAIL INTERTROCHANTERIC Right 10/06/2020   Procedure: INTRAMEDULLARY (IM) NAIL INTERTROCHANTRIC;  Surgeon: Juanell Fairly, MD;  Location: [facility] ORS;  Service: Orthopedics;  Laterality: Right;    Prior to Admission medications   Medication Sig Start Date End Date Taking? Authorizing Provider  amLODipine (NORVASC) 5 MG  tablet Take 1 tablet (5 mg total) by mouth daily. 09/22/21   Corky Downs, MD  cholecalciferol (VITAMIN D) 25 MCG (1000 UNIT) tablet Take 1 tablet (1,000 Units total) by mouth daily. 10/12/20   Sreenath, Sudheer B, MD  donepezil (ARICEPT) 5 MG tablet Take 5 mg by mouth daily. 09/18/20   [provider]  enoxaparin (LOVENOX) 40 MG/0.4ML injection Inject 0.4 mLs (40 mg total) into the skin daily for 14 days. 10/12/20 10/26/20  Tresa Moore, MD  memantine (NAMENDA) 5 MG tablet Take 5 mg by mouth 2 (two) times daily. 07/19/20   [provider]  Multiple Vitamin (MULTIVITAMIN) tablet Take 1 tablet by mouth daily.    [provider]  senna-docusate (SENOKOT-S) 8.6-50 MG tablet Take 1 tablet by mouth 2 (two) times daily. 10/11/20   Tresa Moore, MD    Allergies Patient has no known allergies.  Family History  Problem Relation Age of Onset   Hypertension Mother    Stroke Mother        possibly, died at 13 years of age   Kidney disease Father    Diabetes Father    Heart disease Father    Hypertension Father    Alcohol abuse Father    Alcohol abuse Daughter    Memory loss Maternal Grandmother        died at age 55    Social History Social History   Tobacco Use   Smoking status: Never  Smokeless tobacco: Never  Vaping Use   Vaping Use: Never used  Substance Use Topics   Alcohol use: Yes    Comment: socially   Drug use: No    Review of Systems  Constitutional: No fever/chills Eyes: No visual changes. ENT: No sore throat. Respiratory: Denies cough Cardiovascular: Denies chest pain Gastrointestinal: Denies abdominal pain Genitourinary: Negative for dysuria. Musculoskeletal: Negative for back pain.  Positive for left hip pain Skin: Negative for rash. Psychiatric: no mood changes,     ____________________________________________   PHYSICAL EXAM:  VITAL SIGNS: ED Triage Vitals  Enc Vitals Group     BP 11/06/21 1101 (!) 145/65      Pulse Rate 11/06/21 1101 63     Resp 11/06/21 1101 16     Temp 11/06/21 1101 97.7 F (36.5 C)     Temp Source 11/06/21 1101 Axillary     SpO2 11/06/21 1101 100 %     Weight 11/06/21 1058 159 lb 13.3 oz (72.5 kg)     Height 11/06/21 1058 5\' 2"  (1.575 m)     Head Circumference --      Peak Flow --      Pain Score 11/06/21 1057 10     Pain Loc --      Pain Edu? --      Excl. in GC? --     Constitutional: Alert and oriented. Well appearing and in no acute distress. Eyes: Conjunctivae are normal.  Head: Atraumatic.  Tender posteriorly Nose: No congestion/rhinnorhea. Mouth/Throat: Mucous membranes are moist.   Neck:  supple no lymphadenopathy noted Cardiovascular: Normal rate, regular rhythm. Heart sounds are normal Respiratory: Normal respiratory effort.  No retractions, lungs c t a  GU: deferred Musculoskeletal: Left hip tender to palpation, C-spine tender to palpation, neurovascular intact Neurologic:  Normal speech and language.  Skin:  Skin is warm, dry and intact. No rash noted. Psychiatric: Mood and affect are normal. Speech and behavior are normal.  ____________________________________________   LABS (all labs ordered are listed, but only abnormal results are displayed)  Labs Reviewed  CBC - Abnormal; Notable for the following components:      Result Value   RBC 3.82 (*)    HCT 35.8 (*)    All other components within normal limits  BASIC METABOLIC PANEL - Abnormal; Notable for the following components:   Glucose, Bld 147 (*)    All other components within normal limits  PROTIME-INR  TYPE AND SCREEN  TROPONIN I (HIGH SENSITIVITY)   ____________________________________________   ____________________________________________  RADIOLOGY  CT of the head and C-spine X-ray of the left hip  ____________________________________________   PROCEDURES  Procedure(s) performed: No  Procedures    ____________________________________________   INITIAL  IMPRESSION / ASSESSMENT AND PLAN / ED COURSE  Pertinent labs & imaging results that were available during my care of the patient were reviewed by me and considered in my medical decision making (see chart for details).   The patient is a 75 year old female presents after a fall.  See HPI.  Physical exam shows patient be stable although she is a poor historian.  EMS reports that there are concerns of a hip fracture to the left hip, patient states she did hit her head.  Unsure of how she fell Physical exam shows patient to be tender on the left hip, tender posterior scalp, C-spine minimally tender  Labs and imaging  DDx: Scalp contusion, intracranial hemorrhage, C-spine fracture, left hip fracture  Labs are reassuring, patient's troponin, CBC  metabolic panel are normal  CT of the head and C-spine reviewed by me confirmed by radiology to be negative for any acute abnormalities  CT of the left hip shows a hip fracture.  Reviewed by me confirmed by radiology  Dr. Cyril Loosen in to see the patient. Consult to orthopedics and hospitalist for admission  All information was conveyed to the patient and her husband.  Additional pain medication was ordered.  They are in agreement with treatment plan at this time.    Cheyenne Parker was evaluated in Emergency Department on 11/06/2021 for the symptoms described in the history of present illness. She was evaluated in the context of the global COVID-19 pandemic, which necessitated consideration that the patient might be at risk for infection with the SARS-CoV-2 virus that causes COVID-19. Institutional protocols and algorithms that pertain to the evaluation of patients at risk for COVID-19 are in a state of rapid change based on information released by regulatory bodies including the CDC and federal and state organizations. These policies and algorithms were followed during the patient's care in the ED.    As part of my medical decision making, I reviewed the  following data within the electronic MEDICAL RECORD NUMBER History obtained from family, Nursing notes reviewed and incorporated, Labs reviewed , Old chart reviewed, Radiograph reviewed , Discussed with admitting physician , A consult was requested and obtained from this/these consultant(s) Orthopedics, Evaluated by EM attending , Notes from prior ED visits, and [place] Controlled Substance Database  ____________________________________________   FINAL CLINICAL IMPRESSION(S) / ED DIAGNOSES  Final diagnoses:  Closed fracture of left hip, initial encounter (HCC)  Fall, initial encounter  Injury of head, initial encounter      NEW MEDICATIONS STARTED DURING THIS VISIT:  New Prescriptions   No medications on file     Note:  This document was prepared using Dragon voice recognition software and may include unintentional dictation errors.    Faythe Ghee, PA-C 11/06/21 1314    Jene Every, MD 11/06/21 1323

## 2021-11-07 ENCOUNTER — Encounter
Admission: EM | Disposition: A | Discharge status: Skilled Nursing Facility | Payer: Self-pay | Source: Home / Self Care | Attending: Internal Medicine

## 2021-11-07 ENCOUNTER — Inpatient Hospital Stay: Payer: Medicare Other

## 2021-11-07 ENCOUNTER — Inpatient Hospital Stay: Payer: Medicare Other | Admitting: Anesthesiology

## 2021-11-07 ENCOUNTER — Encounter: Payer: Self-pay | Admitting: Internal Medicine

## 2021-11-07 DIAGNOSIS — F0390 Unspecified dementia without behavioral disturbance: Secondary | ICD-10-CM

## 2021-11-07 HISTORY — PX: FEMUR IM NAIL: SHX1597

## 2021-11-07 LAB — CBC
HCT: 30.2 % — ABNORMAL LOW (ref 36.0–46.0)
Hemoglobin: 10.1 g/dL — ABNORMAL LOW (ref 12.0–15.0)
MCH: 31.2 pg (ref 26.0–34.0)
MCHC: 33.4 g/dL (ref 30.0–36.0)
MCV: 93.2 fL (ref 80.0–100.0)
Platelets: 183 10*3/uL (ref 150–400)
RBC: 3.24 MIL/uL — ABNORMAL LOW (ref 3.87–5.11)
RDW: 12.7 % (ref 11.5–15.5)
WBC: 11 10*3/uL — ABNORMAL HIGH (ref 4.0–10.5)
nRBC: 0 % (ref 0.0–0.2)

## 2021-11-07 LAB — BASIC METABOLIC PANEL
Anion gap: 4 — ABNORMAL LOW (ref 5–15)
BUN: 20 mg/dL (ref 8–23)
CO2: 27 mmol/L (ref 22–32)
Calcium: 9.4 mg/dL (ref 8.9–10.3)
Chloride: 109 mmol/L (ref 98–111)
Creatinine, Ser: 0.7 mg/dL (ref 0.44–1.00)
GFR, Estimated: >60 mL/min (ref >60)
Glucose, Bld: 126 mg/dL — ABNORMAL HIGH (ref 70–99)
Potassium: 4.4 mmol/L (ref 3.5–5.1)
Sodium: 140 mmol/L (ref 135–145)

## 2021-11-07 SURGERY — INSERTION, INTRAMEDULLARY ROD, FEMUR
Anesthesia: Spinal | Laterality: Left

## 2021-11-07 MED ORDER — PROPOFOL 500 MG/50ML IV EMUL
INTRAVENOUS | Status: AC
  Filled 2021-11-07: qty 50

## 2021-11-07 MED ORDER — TRANEXAMIC ACID-NACL 1000-0.7 MG/100ML-% IV SOLN
1000.0000 mg | Freq: Once | INTRAVENOUS | Status: AC
  Administered 2021-11-07: 1000 mg via INTRAVENOUS

## 2021-11-07 MED ORDER — LACTATED RINGERS IV SOLN: INTRAVENOUS | Status: DC | PRN

## 2021-11-07 MED ORDER — SODIUM CHLORIDE 0.9 % IV SOLN: INTRAVENOUS | Status: DC

## 2021-11-07 MED ORDER — FENTANYL CITRATE (PF) 100 MCG/2ML IJ SOLN
INTRAMUSCULAR | Status: AC
  Filled 2021-11-07: qty 2

## 2021-11-07 MED ORDER — NEOMYCIN-POLYMYXIN B GU 40-200000 IR SOLN
Status: DC | PRN
  Administered 2021-11-07: 2 mL

## 2021-11-07 MED ORDER — DOCUSATE SODIUM 100 MG PO CAPS
100.0000 mg | ORAL_CAPSULE | Freq: Two times a day (BID) | ORAL | Status: DC
  Administered 2021-11-07 – 2021-11-11 (×8): 100 mg via ORAL
  Filled 2021-11-07 (×8): qty 1

## 2021-11-07 MED ORDER — HYDROMORPHONE HCL 1 MG/ML IJ SOLN
INTRAMUSCULAR | Status: DC | PRN
  Administered 2021-11-07 (×2): .5 mg via INTRAVENOUS

## 2021-11-07 MED ORDER — PHENOL 1.4 % MT LIQD
1.0000 | OROMUCOSAL | Status: DC | PRN
  Filled 2021-11-07: qty 177

## 2021-11-07 MED ORDER — MENTHOL 3 MG MT LOZG
1.0000 | LOZENGE | OROMUCOSAL | Status: DC | PRN
  Filled 2021-11-07: qty 9

## 2021-11-07 MED ORDER — 0.9 % SODIUM CHLORIDE (POUR BTL) OPTIME
TOPICAL | Status: DC | PRN
  Administered 2021-11-07: 500 mL

## 2021-11-07 MED ORDER — CEFAZOLIN SODIUM-DEXTROSE 2-4 GM/100ML-% IV SOLN
2.0000 g | Freq: Four times a day (QID) | INTRAVENOUS | Status: AC
  Administered 2021-11-07 – 2021-11-08 (×3): 2 g via INTRAVENOUS
  Filled 2021-11-07 (×3): qty 100

## 2021-11-07 MED ORDER — ACETAMINOPHEN 10 MG/ML IV SOLN: 1000.0000 mg | Freq: Once | INTRAVENOUS | Status: AC

## 2021-11-07 MED ORDER — BISACODYL 10 MG RE SUPP
10.0000 mg | Freq: Every day | RECTAL | Status: DC | PRN
  Administered 2021-11-11: 14:00:00 10 mg via RECTAL
  Filled 2021-11-07: qty 1

## 2021-11-07 MED ORDER — SUGAMMADEX SODIUM 200 MG/2ML IV SOLN
INTRAVENOUS | Status: DC | PRN
  Administered 2021-11-07: 200 mg via INTRAVENOUS

## 2021-11-07 MED ORDER — ALUM & MAG HYDROXIDE-SIMETH 200-200-20 MG/5ML PO SUSP: 30.0000 mL | ORAL | Status: DC | PRN

## 2021-11-07 MED ORDER — ONDANSETRON HCL 4 MG PO TABS: 4.0000 mg | ORAL_TABLET | Freq: Four times a day (QID) | ORAL | Status: DC | PRN

## 2021-11-07 MED ORDER — PROPOFOL 10 MG/ML IV BOLUS
INTRAVENOUS | Status: DC | PRN
  Administered 2021-11-07: 60 mg via INTRAVENOUS

## 2021-11-07 MED ORDER — ONDANSETRON HCL 4 MG/2ML IJ SOLN: 4.0000 mg | Freq: Once | INTRAMUSCULAR | Status: DC | PRN

## 2021-11-07 MED ORDER — TRANEXAMIC ACID-NACL 1000-0.7 MG/100ML-% IV SOLN
INTRAVENOUS | Status: AC
  Filled 2021-11-07: qty 100

## 2021-11-07 MED ORDER — FENTANYL CITRATE (PF) 100 MCG/2ML IJ SOLN
INTRAMUSCULAR | Status: DC | PRN
  Administered 2021-11-07 (×2): 50 ug via INTRAVENOUS

## 2021-11-07 MED ORDER — ENOXAPARIN SODIUM 40 MG/0.4ML IJ SOSY
40.0000 mg | PREFILLED_SYRINGE | INTRAMUSCULAR | Status: DC
  Administered 2021-11-08 – 2021-11-09 (×2): 40 mg via SUBCUTANEOUS
  Filled 2021-11-07 (×2): qty 0.4

## 2021-11-07 MED ORDER — ROCURONIUM BROMIDE 100 MG/10ML IV SOLN
INTRAVENOUS | Status: DC | PRN
  Administered 2021-11-07: 30 mg via INTRAVENOUS
  Administered 2021-11-07: 40 mg via INTRAVENOUS

## 2021-11-07 MED ORDER — SEVOFLURANE IN SOLN
RESPIRATORY_TRACT | Status: AC
  Filled 2021-11-07: qty 250

## 2021-11-07 MED ORDER — MAGNESIUM HYDROXIDE 400 MG/5ML PO SUSP: 30.0000 mL | Freq: Every day | ORAL | Status: DC | PRN

## 2021-11-07 MED ORDER — DEXAMETHASONE SODIUM PHOSPHATE 10 MG/ML IJ SOLN
INTRAMUSCULAR | Status: DC | PRN
  Administered 2021-11-07: 10 mg via INTRAVENOUS

## 2021-11-07 MED ORDER — ONDANSETRON HCL 4 MG/2ML IJ SOLN: 4.0000 mg | Freq: Four times a day (QID) | INTRAMUSCULAR | Status: DC | PRN

## 2021-11-07 MED ORDER — ONDANSETRON HCL 4 MG/2ML IJ SOLN
INTRAMUSCULAR | Status: DC | PRN
  Administered 2021-11-07: 4 mg via INTRAVENOUS

## 2021-11-07 MED ORDER — PHENYLEPHRINE HCL-NACL 20-0.9 MG/250ML-% IV SOLN
INTRAVENOUS | Status: DC | PRN
  Administered 2021-11-07: 50 ug/min via INTRAVENOUS

## 2021-11-07 MED ORDER — FENTANYL CITRATE (PF) 100 MCG/2ML IJ SOLN
25.0000 ug | INTRAMUSCULAR | Status: DC | PRN
  Administered 2021-11-07: 25 ug via INTRAVENOUS

## 2021-11-07 MED ORDER — LIDOCAINE HCL (CARDIAC) PF 100 MG/5ML IV SOSY
PREFILLED_SYRINGE | INTRAVENOUS | Status: DC | PRN
  Administered 2021-11-07: 80 mg via INTRAVENOUS

## 2021-11-07 MED ORDER — HYDROMORPHONE HCL 1 MG/ML IJ SOLN
INTRAMUSCULAR | Status: AC
  Filled 2021-11-07: qty 1

## 2021-11-07 MED ORDER — CEFAZOLIN SODIUM-DEXTROSE 1-4 GM/50ML-% IV SOLN
INTRAVENOUS | Status: AC
  Filled 2021-11-07: qty 50

## 2021-11-07 MED ORDER — ACETAMINOPHEN 10 MG/ML IV SOLN: 1000.0000 mg | Freq: Four times a day (QID) | INTRAVENOUS | Status: DC

## 2021-11-07 MED ORDER — ACETAMINOPHEN 10 MG/ML IV SOLN
INTRAVENOUS | Status: AC
  Administered 2021-11-07: 1000 mg via INTRAVENOUS
  Filled 2021-11-07: qty 100

## 2021-11-07 SURGICAL SUPPLY — 48 items
BIT DRILL CROWE POINT TWST 4.3 (DRILL) IMPLANT
CABLE CERLAGE W/CRIMP 1.8 (Cable) ×1 IMPLANT
CABLE CERLAGE W/CRIMP 1.8MM (Cable) ×1 IMPLANT
CHLORAPREP W/TINT 26 (MISCELLANEOUS) ×3 IMPLANT
CORTICAL BONE SCR 5.0MM X 48MM (Screw) ×3 IMPLANT
DRAPE 3/4 80X56 (DRAPES) ×1 IMPLANT
DRAPE SURG 17X11 SM STRL (DRAPES) ×3 IMPLANT
DRAPE U-SHAPE 47X51 STRL (DRAPES) ×3 IMPLANT
DRILL CROWE POINT TWIST 4.3 (DRILL) ×6
ELECT CAUTERY BLADE 6.4 (BLADE) ×3 IMPLANT
ELECT REM PT RETURN 9FT ADLT (ELECTROSURGICAL) ×3
ELECTRODE REM PT RTRN 9FT ADLT (ELECTROSURGICAL) ×1 IMPLANT
GAUZE 4X4 16PLY ~~LOC~~+RFID DBL (SPONGE) ×1 IMPLANT
GAUZE SPONGE 4X4 12PLY STRL (GAUZE/BANDAGES/DRESSINGS) ×3 IMPLANT
GAUZE XEROFORM 1X8 LF (GAUZE/BANDAGES/DRESSINGS) ×2 IMPLANT
GLOVE SURG SYN 9.0  PF PI (GLOVE) ×4
GLOVE SURG SYN 9.0 PF PI (GLOVE) ×1 IMPLANT
GLOVE SURG UNDER POLY LF SZ9 (GLOVE) ×3 IMPLANT
GOWN SRG 2XL LVL 4 RGLN SLV (GOWNS) ×1 IMPLANT
GOWN STRL NON-REIN 2XL LVL4 (GOWNS) ×2
GOWN STRL REUS W/ TWL LRG LVL3 (GOWN DISPOSABLE) ×1 IMPLANT
GOWN STRL REUS W/TWL LRG LVL3 (GOWN DISPOSABLE) ×2
GUIDEPIN VERSANAIL DSP 3.2X444 (ORTHOPEDIC DISPOSABLE SUPPLIES) ×4 IMPLANT
GUIDEWIRE BALL NOSE 100CM (WIRE) ×2 IMPLANT
HFN LH 130 DEG 9MM X 360MM (Nail) ×2 IMPLANT
HIP FRA NAIL LAG SCREW 10.5X90 (Orthopedic Implant) ×3 IMPLANT
IV NS 500ML (IV SOLUTION) ×2
IV NS 500ML BAXH (IV SOLUTION) ×1 IMPLANT
KIT TURNOVER KIT A (KITS) ×3 IMPLANT
MANIFOLD NEPTUNE II (INSTRUMENTS) ×3 IMPLANT
MAT ABSORB  FLUID 56X50 GRAY (MISCELLANEOUS) ×2
MAT ABSORB FLUID 56X50 GRAY (MISCELLANEOUS) ×1 IMPLANT
NDL FILTER BLUNT 18X1 1/2 (NEEDLE) ×1 IMPLANT
NEEDLE FILTER BLUNT 18X 1/2SAF (NEEDLE) ×2
NEEDLE FILTER BLUNT 18X1 1/2 (NEEDLE) ×1 IMPLANT
PACK HIP COMPR (MISCELLANEOUS) ×3 IMPLANT
PAD ABD DERMACEA PRESS 5X9 (GAUZE/BANDAGES/DRESSINGS) ×4 IMPLANT
SCALPEL PROTECTED #15 DISP (BLADE) ×6 IMPLANT
SCREW BONE CORTICAL 5.0X44 (Screw) ×2 IMPLANT
SCREW CORTICL BON 5.0MM X 48MM (Screw) IMPLANT
SCREW LAG HIP FRA NAIL 10.5X90 (Orthopedic Implant) IMPLANT
SPONGE T-LAP 18X18 ~~LOC~~+RFID (SPONGE) ×6 IMPLANT
STAPLER SKIN PROX 35W (STAPLE) ×3 IMPLANT
SUT VIC AB 1 CT1 36 (SUTURE) ×5 IMPLANT
SUT VIC AB 2-0 CT1 (SUTURE) ×3 IMPLANT
SYR 10ML LL (SYRINGE) ×3 IMPLANT
TAPE MICROFOAM 4IN (TAPE) ×1 IMPLANT
WATER STERILE IRR 500ML POUR (IV SOLUTION) ×3 IMPLANT

## 2021-11-07 NOTE — Op Note (Signed)
11/07/2021  4:32 PM  PATIENT:  Cheyenne Parker  75 y.o. female  PRE-OPERATIVE DIAGNOSIS:  LEFT FEMUR FRACTURE  POST-OPERATIVE DIAGNOSIS:  LEFT FEMUR FRACTURE  PROCEDURE:  Procedure(s): INTRAMEDULLARY (IM) NAIL FEMORAL (Left)  SURGEON: Leitha Schuller, MD  ASSISTANTS: None  ANESTHESIA:   general  EBL:  Total I/O In: 700 [I.V.:600; IV Piggyback:100] Out: 300 [Blood:300]  BLOOD ADMINISTERED:none  DRAINS: none   LOCAL MEDICATIONS USED:  NONE  SPECIMEN:  No Specimen  DISPOSITION OF SPECIMEN:  N/A  COUNTS:  YES  TOURNIQUET:  * No tourniquets in log *  IMPLANTS: Biomet affixes left 9 x 360 rod with 90 mm leg screw and 2 distal interlocking screws, Biomet Zimmer cerclage wire x1  DICTATION: .Dragon Dictation patient was brought to the operating room and after adequate general anesthesia was obtained she was placed on the fracture table with the right leg in the well-leg holder left foot in the traction boot.  C-arm was brought in and good visualization of both AP and lateral projections was obtained with significant shortening noted corrected with traction.  After prepping and draping using a barrier drape method appropriate patient identification timeout procedures were completed.  Initially the fracture site was exposed through a lateral incision and a reduction clamp placed to try to get the fracture in better alignment.  Following this proximal incision made and a guidewire inserted in the tip of the trochanter followed by proximal reaming and then it was difficult to get the wire across the fracture site so a cerclage wire was placed and tightened but not cramped this gave acceptable reduction of the fragments to allow the guidewire to pass down the canal reaming was carried out and the 9 x 360 rod chosen and inserted down the canal.  1 is at the appropriate depth guidewire inserted into a center center position the head measured and a 90 mm leg screw was inserted.  This was  tightened and not loosened because this was a fixed angle device with without any intertrochanteric or hip fracture component being femoral shaft fracture.  Distally 2 distal interlocking screws were placed through small incisions drilling measuring and placing the bicortical screws with good position.  The wounds were thoroughly irrigated the IT band closed in the mid thigh incision with a heavy Vicryl as well as the proximal incision 2-0 Vicryl subcutaneously and skin staples followed by Xeroform 4 x 4's ABD and foam tape  PLAN OF CARE: Admit to inpatient   PATIENT DISPOSITION:  PACU - hemodynamically stable.

## 2021-11-07 NOTE — Anesthesia Postprocedure Evaluation (Signed)
Anesthesia Post Note  Patient: Cheyenne Parker  Procedure(s) Performed: INTRAMEDULLARY (IM) NAIL FEMORAL (Left)  Patient location during evaluation: PACU Anesthesia Type: Spinal Level of consciousness: awake and alert Pain management: pain level controlled Vital Signs Assessment: post-procedure vital signs reviewed and stable Respiratory status: spontaneous breathing, nonlabored ventilation, respiratory function stable and patient connected to nasal cannula oxygen Cardiovascular status: blood pressure returned to baseline and stable Postop Assessment: no apparent nausea or vomiting Anesthetic complications: no   No notable events documented.   Last Vitals:  Vitals:   11/07/21 1944 11/07/21 1955  BP: 114/79 113/68  Pulse: (!) 114 (!) 108  Resp: 20 18  Temp: 36.7 C 36.7 C  SpO2: 98% 99%    Last Pain:  Vitals:   11/07/21 1955  TempSrc:   PainSc: 2                  Lenard Simmer

## 2021-11-07 NOTE — Progress Notes (Signed)
Called husband:  Cheyenne Parker. He is at home, stated he was exhausted. Dr. Rosita Kea spoke to patient's husband. 2C RN aware

## 2021-11-07 NOTE — Plan of Care (Addendum)
°  Problem: Clinical Measurements: Goal: Will remain free from infection Outcome: Progressing Goal: Respiratory complications will improve Outcome: Progressing   Problem: Pain Managment: Goal: General experience of comfort will improve Outcome: Progressing   Pt is alert and oriented to self only. Morphine IV and robaxin  given for pain on her Left hip. Kept on NPO for Sx today.

## 2021-11-07 NOTE — Plan of Care (Signed)
Patient off unit to surgery.   Problem: Education: Goal: Knowledge of General Education information will improve Description: Including pain rating scale, medication(s)/side effects and non-pharmacologic comfort measures Outcome: Progressing   Problem: Clinical Measurements: Goal: Will remain free from infection Outcome: Progressing Goal: Respiratory complications will improve Outcome: Progressing   Problem: Pain Managment: Goal: General experience of comfort will improve Outcome: Progressing   Problem: Safety: Goal: Ability to remain free from injury will improve Outcome: Progressing   Problem: Skin Integrity: Goal: Risk for impaired skin integrity will decrease Outcome: Progressing

## 2021-11-07 NOTE — Anesthesia Procedure Notes (Signed)
Procedure Name: Intubation Date/Time: 11/07/2021 3:01 PM Performed by: Nelda Marseille, CRNA Pre-anesthesia Checklist: Patient identified, Patient being monitored, Timeout performed, Emergency Drugs available and Suction available Patient Re-evaluated:Patient Re-evaluated prior to induction Oxygen Delivery Method: Circle system utilized Preoxygenation: Pre-oxygenation with 100% oxygen Induction Type: IV induction Ventilation: Mask ventilation without difficulty Laryngoscope Size: Mac, 3 and McGraph Grade View: Grade II Tube type: Oral Tube size: 7.0 mm Number of attempts: 1 Airway Equipment and Method: Stylet Placement Confirmation: ETT inserted through vocal cords under direct vision, positive ETCO2 and breath sounds checked- equal and bilateral Secured at: 21 cm Tube secured with: Tape Dental Injury: Teeth and Oropharynx as per pre-operative assessment

## 2021-11-07 NOTE — Progress Notes (Signed)
PROGRESS NOTE    Cheyenne Parker  QBH:[id number] DOB: 05-10-1946 DOA: 11/06/2021 PCP: Corky Downs, MD   Assessment & Plan:   Principal Problem:   Closed left hip fracture [facility]) Active Problems:   Essential hypertension   Fall at home, initial encounter   Dementia [facility])     Closed left hip fracture: will go for ORIF today as per ortho surg. Morphine, percocet prn. NPO.   Leukocytosis: likely reactive. Will continue to monitor  Normocytic anemia: H&H are labile. No need for a transfusion currently    HTN: continue on home dose of amlodipine. IV hydralazine    Fall: will consult PT/OT when appropriate   Dementia: continue on home dose of namenda, aricept     DVT prophylaxis: SCDs Code Status: full  Family Communication: discussed pt's care w/ pt's husband, Homero Fellers, and answered his questions  Disposition Plan: depends on PT/OT recs (not consulted yet)   Level of care: Med-Surg  Status is: Inpatient  Remains inpatient appropriate because: severity of illness, needs surgery     Consultants:  Ortho surg   Procedures:   Antimicrobials:   Subjective: Pt is pleasantly confused   Objective: Vitals:   11/06/21 1700 11/06/21 1842 11/06/21 2040 11/07/21 0609  BP: (!) 159/79 (!) 118/99 129/72 (!) 163/77  Pulse: (!) 118 (!) 110 (!) 105 (!) 103  Resp: 20 18 20 18   Temp:  98.8 F (37.1 C) 98 F (36.7 C) 98.4 F (36.9 C)  TempSrc:  Oral Oral Oral  SpO2: 97% 100% 95% 94%  Weight:      Height:        Intake/Output Summary (Last 24 hours) at 11/07/2021 0758 Last data filed at 11/07/2021 0600 Gross per 24 hour  Intake --  Output 200 ml  Net -200 ml   Filed Weights   11/06/21 1058  Weight: 72.5 kg    Examination:  General exam: Appears calm and comfortable  Respiratory system: Clear to auscultation. Respiratory effort normal. Cardiovascular system: S1 & S2 +. No rubs, gallops or clicks. Gastrointestinal system: Abdomen is nondistended, soft and nontender.  Normal bowel sounds heard. Central nervous system: Alert and awake. Moves all extremities  Extremities: left leg is slightly shortened and externally rotated  Psychiatry: Judgement and insight appear poor. Flat mood and affect     Data Reviewed: I have personally reviewed following labs and imaging studies  CBC: Recent Labs  Lab 11/06/21 1102  WBC 8.3  HGB 12.0  HCT 35.8*  MCV 93.7  PLT 207   Basic Metabolic Panel: Recent Labs  Lab 11/06/21 1202  NA 139  K 3.7  CL 107  CO2 24  GLUCOSE 147*  BUN 18  CREATININE 0.65  CALCIUM 9.4   GFR: Estimated Creatinine Clearance: 57.6 mL/min (by C-G formula based on SCr of 0.65 mg/dL). Liver Function Tests: No results for input(s): AST, ALT, ALKPHOS, BILITOT, PROT, ALBUMIN in the last 168 hours. No results for input(s): LIPASE, AMYLASE in the last 168 hours. No results for input(s): AMMONIA in the last 168 hours. Coagulation Profile: Recent Labs  Lab 11/06/21 1115  INR 1.0   Cardiac Enzymes: No results for input(s): CKTOTAL, CKMB, CKMBINDEX, TROPONINI in the last 168 hours. BNP (last 3 results) No results for input(s): PROBNP in the last 8760 hours. HbA1C: No results for input(s): HGBA1C in the last 72 hours. CBG: No results for input(s): GLUCAP in the last 168 hours. Lipid Profile: No results for input(s): CHOL, HDL, LDLCALC, TRIG, CHOLHDL, LDLDIRECT  in the last 72 hours. Thyroid Function Tests: No results for input(s): TSH, T4TOTAL, FREET4, T3FREE, THYROIDAB in the last 72 hours. Anemia Panel: No results for input(s): VITAMINB12, FOLATE, FERRITIN, TIBC, IRON, RETICCTPCT in the last 72 hours. Sepsis Labs: No results for input(s): PROCALCITON, LATICACIDVEN in the last 168 hours.  Recent Results (from the past 240 hour(s))  Resp Panel by RT-PCR (Flu A&B, Covid) Nasopharyngeal Swab     Status: None   Collection Time: 11/06/21  1:38 PM   Specimen: Nasopharyngeal Swab; Nasopharyngeal(NP) swabs in vial transport medium   Result Value Ref Range Status   SARS Coronavirus 2 by RT PCR NEGATIVE NEGATIVE Final    Comment: (NOTE) SARS-CoV-2 target nucleic acids are NOT DETECTED.  The SARS-CoV-2 RNA is generally detectable in upper respiratory specimens during the acute phase of infection. The lowest concentration of SARS-CoV-2 viral copies this assay can detect is 138 copies/mL. A negative result does not preclude SARS-Cov-2 infection and should not be used as the sole basis for treatment or other patient management decisions. A negative result may occur with  improper specimen collection/handling, submission of specimen other than nasopharyngeal swab, presence of viral mutation(s) within the areas targeted by this assay, and inadequate number of viral copies(<138 copies/mL). A negative result must be combined with clinical observations, patient history, and epidemiological information. The expected result is Negative.  Fact Sheet for Patients:  BloggerCourse.com  Fact Sheet for Healthcare Providers:  SeriousBroker.it  This test is no t yet approved or cleared by the [place] FDA and  has been authorized for detection and/or diagnosis of SARS-CoV-2 by FDA under an Emergency Use Authorization (EUA). This EUA will remain  in effect (meaning this test can be used) for the duration of the COVID-19 declaration under Section 564(b)(1) of the Act, 21 U.S.C.section 360bbb-3(b)(1), unless the authorization is terminated  or revoked sooner.       Influenza A by PCR NEGATIVE NEGATIVE Final   Influenza B by PCR NEGATIVE NEGATIVE Final    Comment: (NOTE) The Xpert Xpress SARS-CoV-2/FLU/RSV plus assay is intended as an aid in the diagnosis of influenza from Nasopharyngeal swab specimens and should not be used as a sole basis for treatment. Nasal washings and aspirates are unacceptable for Xpert Xpress SARS-CoV-2/FLU/RSV testing.  Fact Sheet for  Patients: BloggerCourse.com  Fact Sheet for Healthcare Providers: SeriousBroker.it  This test is not yet approved or cleared by the [place] FDA and has been authorized for detection and/or diagnosis of SARS-CoV-2 by FDA under an Emergency Use Authorization (EUA). This EUA will remain in effect (meaning this test can be used) for the duration of the COVID-19 declaration under Section 564(b)(1) of the Act, 21 U.S.C. section 360bbb-3(b)(1), unless the authorization is terminated or revoked.  Performed at [facility], [address] [place], [place] [address]          Radiology Studies: CT HEAD WO CONTRAST ( )  Result Date: 11/06/2021 CLINICAL DATA:  74 year old female with history of head trauma after a fall. EXAM: CT HEAD WITHOUT CONTRAST CT CERVICAL SPINE WITHOUT CONTRAST TECHNIQUE: Multidetector CT imaging of the head and cervical spine was performed following the standard protocol without intravenous contrast. Multiplanar CT image reconstructions of the cervical spine were also generated. COMPARISON:  No priors. FINDINGS: CT HEAD FINDINGS Brain: Moderate cerebral atrophy. Patchy and confluent areas of decreased attenuation are noted throughout the deep and periventricular white matter of the cerebral hemispheres bilaterally, compatible with chronic microvascular ischemic disease. Well-defined area of  low attenuation in the left basal ganglia, compatible with a small lacunar infarct. Physiologic calcification also noted in the left basal ganglia. No evidence of acute infarction, hemorrhage, hydrocephalus, extra-axial collection or mass lesion/mass effect. Vascular: No hyperdense vessel or unexpected calcification. Skull: Normal. Negative for fracture or focal lesion. Sinuses/Orbits: No acute finding. Other: None. CT CERVICAL SPINE FINDINGS Alignment: Normal. Skull base and vertebrae: No acute fracture. No primary bone  lesion or focal pathologic process. Soft tissues and spinal canal: No prevertebral fluid or swelling. No visible canal hematoma. Disc levels: Mild multilevel degenerative disc disease most evident at C6-C7. Mild multilevel facet arthropathy. Upper chest: Unremarkable. Other: None. IMPRESSION: 1. No evidence of significant acute traumatic injury to the skull, brain or cervical spine. 2. Moderate cerebral atrophy with extensive chronic microvascular ischemic changes in the cerebral white matter and old left basal ganglia lacunar infarct, as above. 3. Multilevel degenerative disc disease and cervical spondylosis, as above. Electronically Signed   By: Trudie Reed M.D.   On: 11/06/2021 12:14   CT Cervical Spine Wo Contrast  Result Date: 11/06/2021 CLINICAL DATA:  75 year old female with history of head trauma after a fall. EXAM: CT HEAD WITHOUT CONTRAST CT CERVICAL SPINE WITHOUT CONTRAST TECHNIQUE: Multidetector CT imaging of the head and cervical spine was performed following the standard protocol without intravenous contrast. Multiplanar CT image reconstructions of the cervical spine were also generated. COMPARISON:  No priors. FINDINGS: CT HEAD FINDINGS Brain: Moderate cerebral atrophy. Patchy and confluent areas of decreased attenuation are noted throughout the deep and periventricular white matter of the cerebral hemispheres bilaterally, compatible with chronic microvascular ischemic disease. Well-defined area of low attenuation in the left basal ganglia, compatible with a small lacunar infarct. Physiologic calcification also noted in the left basal ganglia. No evidence of acute infarction, hemorrhage, hydrocephalus, extra-axial collection or mass lesion/mass effect. Vascular: No hyperdense vessel or unexpected calcification. Skull: Normal. Negative for fracture or focal lesion. Sinuses/Orbits: No acute finding. Other: None. CT CERVICAL SPINE FINDINGS Alignment: Normal. Skull base and vertebrae: No acute  fracture. No primary bone lesion or focal pathologic process. Soft tissues and spinal canal: No prevertebral fluid or swelling. No visible canal hematoma. Disc levels: Mild multilevel degenerative disc disease most evident at C6-C7. Mild multilevel facet arthropathy. Upper chest: Unremarkable. Other: None. IMPRESSION: 1. No evidence of significant acute traumatic injury to the skull, brain or cervical spine. 2. Moderate cerebral atrophy with extensive chronic microvascular ischemic changes in the cerebral white matter and old left basal ganglia lacunar infarct, as above. 3. Multilevel degenerative disc disease and cervical spondylosis, as above. Electronically Signed   By: Trudie Reed M.D.   On: 11/06/2021 12:14   CT Hip Left Wo Contrast  Result Date: 11/06/2021 CLINICAL DATA:  Evaluate left hip fracture. EXAM: CT OF THE LEFT HIP WITHOUT CONTRAST TECHNIQUE: Multidetector CT imaging of the left hip was performed according to the standard protocol. Multiplanar CT image reconstructions were also generated. COMPARISON:  None. FINDINGS: There is a displaced and angulated subtrochanteric fracture of the left hip. There is also fairly marked shortening. The fracture does involve the lesser trochanter which is able stand displaced up near the upper aspect of the femoral neck. The femoral head is normally located. Minimal degenerative changes. The visualized left hemipelvic bony structures are intact. No acetabular or pubic rami fractures. Moderate degenerative changes noted at the pubic symphysis. There is associated hematoma involving the surrounding hip muscles. No significant intrapelvic abnormalities are identified. IMPRESSION: 1. Displaced, angulated and  shortened largely subtrochanteric fracture as detailed above. 2. No pelvic fractures. Electronically Signed   By: Rudie Meyer M.D.   On: 11/06/2021 12:37   DG Hip Port Unilat W or Wo Pelvis 1 View Left  Result Date: 11/06/2021 CLINICAL DATA:  LEFT hip  fracture preparation for surgery. EXAM: DG HIP (WITH OR WITHOUT PELVIS) 1V PORT LEFT COMPARISON:  CT of November 06, 2021. FINDINGS: Displaced, angulated and foreshortened fracture of the subtrochanteric LEFT femur extending into the lesser trochanter is similar to recent CT imaging. No substantial change on AP view. IMPRESSION: Left femoral fracture as above. No substantial change on AP view. Electronically Signed   By: Donzetta Kohut M.D.   On: 11/06/2021 14:17        Scheduled Meds:  amLODipine  5 mg Oral Daily   cholecalciferol  1,000 Units Oral Daily   donepezil  5 mg Oral Daily   feeding supplement  237 mL Oral BID BM   memantine  5 mg Oral BID   multivitamin with minerals  1 tablet Oral Daily   senna-docusate  1 tablet Oral BID   Continuous Infusions:   ceFAZolin (ANCEF) IV       LOS: 1 day    Time spent: 32 mins     Charise Killian, MD [facility] Pager [phone]  If 7PM-7AM, please contact night-coverage 11/07/2021, 7:58 AM

## 2021-11-07 NOTE — Anesthesia Preprocedure Evaluation (Signed)
Anesthesia Evaluation  Patient identified by MRN, date of birth, ID band Patient awake    Reviewed: Allergy & Precautions, NPO status , Patient's Chart, lab work & pertinent test results  Airway Mallampati: II  TM Distance: >3 FB Neck ROM: full    Dental  (+) Teeth Intact   Pulmonary neg pulmonary ROS,     + decreased breath sounds      Cardiovascular Exercise Tolerance: Poor hypertension, negative cardio ROS Normal cardiovascular exam Rhythm:Regular     Neuro/Psych Dementia negative neurological ROS  negative psych ROS   GI/Hepatic negative GI ROS, Neg liver ROS,   Endo/Other  negative endocrine ROS  Renal/GU negative Renal ROS  negative genitourinary   Musculoskeletal negative musculoskeletal ROS (+)   Abdominal Normal abdominal exam  (+)   Peds negative pediatric ROS (+)  Hematology negative hematology ROS (+) Blood dyscrasia, anemia ,   Anesthesia Other Findings Past Medical History: No date: Anemia No date: Hyperlipidemia No date: IFG (impaired fasting glucose) No date: Obesity No date: Osteoporosis No date: Vitamin D deficiency disease  Past Surgical History: No date: ABDOMINAL HYSTERECTOMY     Comment:  complete No date: FRACTURE SURGERY 10/06/2020: INTRAMEDULLARY (IM) NAIL INTERTROCHANTERIC; Right     Comment:  Procedure: INTRAMEDULLARY (IM) NAIL INTERTROCHANTRIC;                Surgeon: Juanell Fairly, MD;  Location: [facility] ORS;                Service: Orthopedics;  Laterality: Right;  BMI    Body Mass Index: 29.23 kg/m      Reproductive/Obstetrics negative OB ROS                             Anesthesia Physical Anesthesia Plan  ASA: 3  Anesthesia Plan: Spinal   Post-op Pain Management:    Induction:   PONV Risk Score and Plan:   Airway Management Planned: Nasal Cannula  Additional Equipment:   Intra-op Plan:   Post-operative Plan:   Informed  Consent: I have reviewed the patients History and Physical, chart, labs and discussed the procedure including the risks, benefits and alternatives for the proposed anesthesia with the patient or authorized representative who has indicated his/her understanding and acceptance.     Dental Advisory Given  Plan Discussed with: CRNA and Surgeon  Anesthesia Plan Comments:         Anesthesia Quick Evaluation

## 2021-11-07 NOTE — Progress Notes (Signed)
Consent signed and in the chart

## 2021-11-07 NOTE — Progress Notes (Signed)
Patient having quite a bit of pain.  Site marked plan on surgery later today ORIF of a complicated femur fracture.

## 2021-11-07 NOTE — Transfer of Care (Signed)
Immediate Anesthesia Transfer of Care Note  Patient: Cheyenne Parker  Procedure(s) Performed: INTRAMEDULLARY (IM) NAIL FEMORAL (Left)  Patient Location: PACU  Anesthesia Type:General  Level of Consciousness: awake, alert  and oriented  Airway & Oxygen Therapy: Patient Spontanous Breathing and Patient connected to face mask oxygen  Post-op Assessment: Report given to RN and Post -op Vital signs reviewed and stable  Post vital signs: Reviewed and stable  Last Vitals:  Vitals Value Taken Time  BP 155/61 11/07/21 1630  Temp    Pulse 98 11/07/21 1635  Resp 29 11/07/21 1635  SpO2 100 % 11/07/21 1635  Vitals shown include unvalidated device data.  Last Pain:  Vitals:   11/07/21 1402  TempSrc: Oral  PainSc: 0-No pain         Complications: No notable events documented.

## 2021-11-07 NOTE — Progress Notes (Signed)
Patient returned from PACU. Dressing intact to left hip. Patient sleeping but easy to arousal. Oxygen intact at 2 liters via nasal cannula.

## 2021-11-07 NOTE — TOC CM/SW Note (Signed)
CSW acknowledges SNF/Home Health/DME consult. Please consult PT and OT when appropriate to evaluate for these needs.  Charlynn Court, CSW [phone]

## 2021-11-08 LAB — CBC
HCT: 23.1 % — ABNORMAL LOW (ref 36.0–46.0)
Hemoglobin: 7.8 g/dL — ABNORMAL LOW (ref 12.0–15.0)
MCH: 31.2 pg (ref 26.0–34.0)
MCHC: 33.8 g/dL (ref 30.0–36.0)
MCV: 92.4 fL (ref 80.0–100.0)
Platelets: 163 10*3/uL (ref 150–400)
RBC: 2.5 MIL/uL — ABNORMAL LOW (ref 3.87–5.11)
RDW: 12.7 % (ref 11.5–15.5)
WBC: 11 10*3/uL — ABNORMAL HIGH (ref 4.0–10.5)
nRBC: 0 % (ref 0.0–0.2)

## 2021-11-08 LAB — BASIC METABOLIC PANEL
Anion gap: 6 (ref 5–15)
BUN: 18 mg/dL (ref 8–23)
CO2: 25 mmol/L (ref 22–32)
Calcium: 8.5 mg/dL — ABNORMAL LOW (ref 8.9–10.3)
Chloride: 107 mmol/L (ref 98–111)
Creatinine, Ser: 0.67 mg/dL (ref 0.44–1.00)
GFR, Estimated: >60 mL/min (ref >60)
Glucose, Bld: 117 mg/dL — ABNORMAL HIGH (ref 70–99)
Potassium: 4.2 mmol/L (ref 3.5–5.1)
Sodium: 138 mmol/L (ref 135–145)

## 2021-11-08 MED ORDER — FERROUS SULFATE 325 (65 FE) MG PO TABS
325.0000 mg | ORAL_TABLET | Freq: Every day | ORAL | Status: DC
  Administered 2021-11-08 – 2021-11-11 (×4): 325 mg via ORAL
  Filled 2021-11-08 (×4): qty 1

## 2021-11-08 NOTE — NC FL2 (Signed)
[place] MEDICAID FL2 LEVEL OF CARE SCREENING TOOL     IDENTIFICATION  Patient Name: Cheyenne Parker Birthdate: [birth year] Sex: female Admission Date (Current Location): 11/06/2021  [facility] and [place] Number:  Chiropodist and Address:  [facility], [address], [place], [place] [address]      Provider Number: [id number]  Attending Physician Name and Address:  Charise Killian, MD  Relative Name and Phone Number:       Current Level of Care: Hospital Recommended Level of Care: Skilled Nursing Facility Prior Approval Number:    Date Approved/Denied:   PASRR Number: [id number]  Discharge Plan: SNF    Current Diagnoses: Patient Active Problem List   Diagnosis Date Noted   Closed left hip fracture (HCC) 11/06/2021   Fall at home, initial encounter 11/06/2021   Dementia (HCC) 11/06/2021   Intertrochanteric fracture of right femur, closed, initial encounter (HCC) 10/05/2020   Dementia without behavioral disturbance (HCC) 10/05/2020   Need for COVID-19 vaccine 09/10/2020   Memory change 07/16/2020   Essential hypertension 04/15/2020   Low HDL (under 40) 04/28/2017   Annual physical exam 09/21/2016   Constipation 09/20/2015   Elevated blood pressure, situational 09/20/2015   Osteoporosis    IFG (impaired fasting glucose)    Mixed hyperlipidemia    Hemorrhoid 07/20/2012   Insomnia 07/20/2012    Orientation RESPIRATION BLADDER Height & Weight     Self, Situation  Normal Incontinent, External catheter Weight: 159 lb 13.3 oz (72.5 kg) Height:  5\' 2"  (157.5 cm)  BEHAVIORAL SYMPTOMS/MOOD NEUROLOGICAL BOWEL NUTRITION STATUS   (Was agitated early this morning. Calm and cooperative as the day progressed.)  (Dementia) Continent Diet (Regular)  AMBULATORY STATUS COMMUNICATION OF NEEDS Skin   Extensive Assist Verbally Surgical wounds (Incision on left hip (gauze). Two incisions on left leg (xeroform, telfa, microfoam tape). Ortho will  change dressings tomorrow (12/18).)                       Personal Care Assistance Level of Assistance  Bathing, Feeding, Dressing Bathing Assistance: Maximum assistance Feeding assistance: Limited assistance Dressing Assistance: Maximum assistance     Functional Limitations Info  Sight, Hearing, Speech Sight Info: Adequate Hearing Info: Adequate Speech Info: Adequate    SPECIAL CARE FACTORS FREQUENCY  PT (By licensed PT), OT (By licensed OT)     PT Frequency: 5 x week OT Frequency: 5 x week            Contractures Contractures Info: Not present    Additional Factors Info  Code Status, Allergies Code Status Info: Full code Allergies Info: NDKA           Current Medications (11/08/2021):  This is the current hospital active medication list Current Facility-Administered Medications  Medication Dose Route Frequency Provider Last Rate Last Admin   0.9 %  sodium chloride infusion   Intravenous Continuous 11/10/2021, MD 75 mL/hr at 11/08/21 0446 New Bag at 11/08/21 0446   acetaminophen (TYLENOL) tablet 650 mg  650 mg Oral Q6H PRN 11/10/21, MD   650 mg at 11/06/21 1936   alum & mag hydroxide-simeth (MAALOX/MYLANTA) 200-200-20 MG/5ML suspension 30 mL  30 mL Oral Q4H PRN 06-01-2001, MD       amLODipine (NORVASC) tablet 5 mg  5 mg Oral Daily Kennedy Bucker, MD   5 mg at 11/08/21 11/10/21   bisacodyl (DULCOLAX) suppository 10 mg  10 mg Rectal Daily PRN 7322,  MD       cholecalciferol (VITAMIN D) tablet 1,000 Units  1,000 Units Oral Daily Kennedy Bucker, MD   1,000 Units at 11/08/21 1540   docusate sodium (COLACE) capsule 100 mg  100 mg Oral BID Kennedy Bucker, MD   100 mg at 11/08/21 0867   donepezil (ARICEPT) tablet 5 mg  5 mg Oral Daily Kennedy Bucker, MD   5 mg at 11/08/21 0927   enoxaparin (LOVENOX) injection 40 mg  40 mg Subcutaneous Q24H Kennedy Bucker, MD   40 mg at 11/08/21 6195   feeding supplement (ENSURE ENLIVE / ENSURE PLUS) liquid 237 mL  237 mL Oral  BID BM Kennedy Bucker, MD   237 mL at 11/08/21 1217   ferrous sulfate tablet 325 mg  325 mg Oral Q breakfast [place]son [place], PA-C   325 mg at 11/08/21 0932   hydrALAZINE (APRESOLINE) injection 5 mg  5 mg Intravenous Q2H PRN Kennedy Bucker, MD       magnesium hydroxide (MILK OF MAGNESIA) suspension 30 mL  30 mL Oral Daily PRN Kennedy Bucker, MD       memantine [facility]) tablet 5 mg  5 mg Oral BID Kennedy Bucker, MD   5 mg at 11/08/21 6712   menthol-cetylpyridinium (CEPACOL) lozenge 3 mg  1 lozenge Oral PRN Kennedy Bucker, MD       Or   phenol (CHLORASEPTIC) mouth spray 1 spray  1 spray Mouth/Throat PRN Kennedy Bucker, MD       methocarbamol (ROBAXIN) tablet 500 mg  500 mg Oral Q8H PRN Kennedy Bucker, MD   500 mg at 11/06/21 1936   morphine 2 MG/ML injection 2 mg  2 mg Intravenous Q4H PRN Kennedy Bucker, MD   2 mg at 11/08/21 0559   multivitamin with minerals tablet 1 tablet  1 tablet Oral Daily Kennedy Bucker, MD   1 tablet at 11/08/21 0927   ondansetron (ZOFRAN) tablet 4 mg  4 mg Oral Q6H PRN Kennedy Bucker, MD       Or   ondansetron [facility]) injection 4 mg  4 mg Intravenous Q6H PRN Kennedy Bucker, MD       oxyCODONE-acetaminophen (PERCOCET/ROXICET) 5-325 MG per tablet 1 tablet  1 tablet Oral Q4H PRN Kennedy Bucker, MD   1 tablet at 11/07/21 2004   polyethylene glycol (MIRALAX / GLYCOLAX) packet 17 g  17 g Oral Daily PRN Kennedy Bucker, MD       senna-docusate (Senokot-S) tablet 1 tablet  1 tablet Oral BID Kennedy Bucker, MD   1 tablet at 11/08/21 4580     Discharge Medications: Please see discharge summary for a list of discharge medications.  Relevant Imaging Results:  Relevant Lab Results:   Additional Information SS#: [id number]  Margarito Liner, LCSW

## 2021-11-08 NOTE — TOC Initial Note (Signed)
Transition of Care [facility]) - Initial/Assessment Note    Patient Details  Name: Cheyenne Parker MRN: [id number] Date of Birth: 1946/02/14  Transition [facility]) CM/SW Contact:    Candie Chroman, LCSW Phone Number: 11/08/2021, 12:40 PM  Clinical Narrative:  CSW met with patient. Husband at bedside. CSW introduced role and explained that PT recommendations would be discussed. They are agreeable to SNF placement. [facility] is first preference because she went there for rehab last year. Left message for admissions coordinator to notify. No further concerns. CSW encouraged patient and her husband to contact CSW as needed. CSW will continue to follow patient and her husband for support and facilitate discharge to SNF once medically stable.                Expected Discharge Plan: Skilled Nursing Facility Barriers to Discharge: Continued Medical Work up   Patient Goals and CMS Choice        Expected Discharge Plan and Services Expected Discharge Plan: [place] Acute Care Choice: [place] Living arrangements for the past 2 months: Single Family Home                                      Prior Living Arrangements/Services Living arrangements for the past 2 months: Single Family Home Lives with:: Spouse Patient language and need for interpreter reviewed:: Yes Do you feel safe going back to the place where you live?: Yes      Need for Family Participation in Patient Care: Yes (Comment) Care giver support system in place?: Yes (comment)   Criminal Activity/Legal Involvement Pertinent to Current Situation/Hospitalization: No - Comment as needed  Activities of Daily Living Home Assistive Devices/Equipment: None ADL Screening (condition at time of admission) Patient's cognitive ability adequate to safely complete daily activities?: Yes Is the patient deaf or have difficulty hearing?: No Does the patient have difficulty seeing, even when wearing  glasses/contacts?: No Does the patient have difficulty concentrating, remembering, or making decisions?: No Patient able to express need for assistance with ADLs?: Yes Does the patient have difficulty dressing or bathing?: No Independently performs ADLs?: Yes (appropriate for developmental age) Does the patient have difficulty walking or climbing stairs?: Yes Weakness of Legs: Both Weakness of Arms/Hands: None  Permission Sought/Granted Permission sought to share information with : Facility Sport and exercise psychologist, Family Supports    Share Information with NAME: Analisse Randle  Permission granted to share info w AGENCY: SNF's  Permission granted to share info w Relationship: Husband  Permission granted to share info w Contact Information: [phone]  Emotional Assessment Appearance:: Appears stated age Attitude/Demeanor/Rapport: Engaged, Gracious Affect (typically observed): Accepting, Appropriate, Calm, Pleasant Orientation: : Oriented to Self, Oriented to Situation Alcohol / Substance Use: Not Applicable Psych Involvement: No (comment)  Admission diagnosis:  Closed left hip fracture ([place]) [S72.002A] Closed fracture of left hip, initial encounter ([place]) [S72.002A] Injury of head, initial encounter [S09.90XA] Fall, initial encounter [W19.XXXA] Patient Active Problem List   Diagnosis Date Noted   Closed left hip fracture ([place]) 11/06/2021   Fall at home, initial encounter 11/06/2021   Dementia ([place]) 11/06/2021   Intertrochanteric fracture of right femur, closed, initial encounter ([place]) 10/05/2020   Dementia without behavioral disturbance ([place]) 10/05/2020   Need for COVID-19 vaccine 09/10/2020   Memory change 07/16/2020   Essential hypertension 04/15/2020   Low HDL (under 40) 04/28/2017  Annual physical exam 09/21/2016   Constipation 09/20/2015   Elevated blood pressure, situational 09/20/2015   Osteoporosis    IFG (impaired fasting glucose)    Mixed hyperlipidemia     Hemorrhoid 07/20/2012   Insomnia 07/20/2012   PCP:  Cletis Athens, MD Pharmacy:   [facility] [address] ([address]), [place]) [place] [address] Phone: [phone] Fax: [phone]     Social Determinants of Health (SDOH) Interventions    Readmission Risk Interventions No flowsheet data found.

## 2021-11-08 NOTE — Plan of Care (Signed)
°  Problem: Clinical Measurements: Goal: Will remain free from infection Outcome: Progressing Goal: Respiratory complications will improve Outcome: Progressing   Problem: Pain Managment: Goal: General experience of comfort will improve Outcome: Progressing   Problem: Safety: Goal: Ability to remain free from injury will improve Outcome: Progressing   Pt is alert and oriented to self only. Very confused. Pt pulled her IV twice, gown and dressing on her leg. Dressing applied. V/S stable. Morphine IV given for pain. Mittens applied.

## 2021-11-08 NOTE — Progress Notes (Signed)
°  Subjective: 1 Day Post-Op Procedure(s) (LRB): INTRAMEDULLARY (IM) NAIL FEMORAL (Left) Patient reports pain as mild.   Patient is well but was agitated this morning per nursing report, safety mittens applied. Seems much improved when I examine her. PT and care management to assist with discharge planning. Negative for chest pain and shortness of breath Fever: no Gastrointestinal:Negative for nausea and vomiting  Objective: Vital signs in last 24 hours: Temp:  [97.8 F (36.6 C)-99.2 F (37.3 C)] 98.4 F (36.9 C) (12/17 0739) Pulse Rate:  [88-114] 105 (12/17 0739) Resp:  [10-20] 18 (12/17 0739) BP: (110-155)/(42-101) 130/49 (12/17 0739) SpO2:  [96 %-100 %] 99 % (12/17 0739)  Intake/Output from previous day:  Intake/Output Summary (Last 24 hours) at 11/08/2021 0751 Last data filed at 11/08/2021 0616 Gross per 24 hour  Intake 1220 ml  Output 700 ml  Net 520 ml    Intake/Output this shift: No intake/output data recorded.  Labs: Recent Labs    11/06/21 1102 11/07/21 0810 11/08/21 0547  HGB 12.0 10.1* 7.8*   Recent Labs    11/07/21 0810 11/08/21 0547  WBC 11.0* 11.0*  RBC 3.24* 2.50*  HCT 30.2* 23.1*  PLT 183 163   Recent Labs    11/07/21 0810 11/08/21 0547  NA 140 138  K 4.4 4.2  CL 109 107  CO2 27 25  BUN 20 18  CREATININE 0.70 0.67  GLUCOSE 126* 117*  CALCIUM 9.4 8.5*   Recent Labs    11/06/21 1115  INR 1.0     EXAM General - Patient is Alert and oriented to self. Extremity - ABD soft Neurovascular intact Dorsiflexion/Plantar flexion intact Incision: dressing C/D/I No cellulitis present Compartment soft Dressing/Incision - clean, dry, no drainage, bulky dressing intact to the left leg. Motor Function - intact, moving foot and toes well on exam.  Abdomen soft with normal bowel sounds.  Past Medical History:  Diagnosis Date   Anemia    Hyperlipidemia    IFG (impaired fasting glucose)    Obesity    Osteoporosis    Vitamin D  deficiency disease     Assessment/Plan: 1 Day Post-Op Procedure(s) (LRB): INTRAMEDULLARY (IM) NAIL FEMORAL (Left) Principal Problem:   Closed left hip fracture (HCC) Active Problems:   Essential hypertension   Fall at home, initial encounter   Dementia (HCC)  Estimated body mass index is 29.23 kg/m as calculated from the following:   Height as of this encounter: 5\' 2"  (1.575 m).   Weight as of this encounter: 72.5 kg. Advance diet Up with therapy D/C IV fluids when tolerating po intake.  Labs reviewed this AM, Hg 7.8, cbc ordered for tomorrow.  Will begin iron supplement. Patient with confusion this morning, she does answer all of my questions appropriately.  Patient with history of dementia. UP with therapy today, WBAT to the left leg. Will plan on changing bulky dressing tomorrow.  DVT Prophylaxis - Lovenox Weight-Bearing as tolerated to left leg  J. , PA-C [facility] Orthopaedic Surgery 11/08/2021, 7:51 AM

## 2021-11-08 NOTE — Progress Notes (Signed)
PROGRESS NOTE    Cheyenne Parker  EVO:[id number] DOB: 1946-05-26 DOA: 11/06/2021 PCP: Corky Downs, MD   Assessment & Plan:   Principal Problem:   Closed left hip fracture [facility]) Active Problems:   Essential hypertension   Fall at home, initial encounter   Dementia [facility])     Closed left hip fracture: s/p left intramedullary nail femoral on 12/16 as per ortho surg.  Percocet prn. PT/OT consulted   Leukocytosis: likely reactive. Will continue to monitor  Normocytic anemia:  w/ likely component of acute blood loss anemia secondary to above surg. Will transfuse if Hb < 7.0     HTN: continue on home dose of amlodipine. IV hydralazine    Fall: PT/OT consulted    Dementia: continue on home dose of aricept, namenda     DVT prophylaxis: SCDs Code Status: full  Family Communication: called pt's husband, Homero Fellers, no answer so I left a message  Disposition Plan: depends on PT/OT recs   Level of care: Med-Surg  Status is: Inpatient  Remains inpatient appropriate because: severity of illness, PT/OT needs to see     Consultants:  Ortho surg   Procedures:   Antimicrobials:   Subjective: Pt denies any pain. Pt is pleasantly confused  Objective: Vitals:   11/07/21 1955 11/08/21 0000 11/08/21 0415 11/08/21 0739  BP: 113/68 116/79 (!) 124/56 (!) 130/49  Pulse: (!) 108 (!) 105 (!) 108 (!) 105  Resp: 18 20 18 18   Temp: 98 F (36.7 C) 97.8 F (36.6 C) 99 F (37.2 C) 98.4 F (36.9 C)  TempSrc:  Oral Oral Oral  SpO2: 99% 96% 98% 99%  Weight:      Height:        Intake/Output Summary (Last 24 hours) at 11/08/2021 0834 Last data filed at 11/08/2021 11/10/2021 Gross per 24 hour  Intake 1220 ml  Output 700 ml  Net 520 ml   Filed Weights   11/06/21 1058  Weight: 72.5 kg    Examination:  General exam: Appears comfortable   Respiratory system: clear breath sounds b/l  Cardiovascular system: S1/S2+. No gallops or clicks  Gastrointestinal system: Abd is soft, NT, ND &  normal bowel sounds  Central nervous system: Alert and oriented to person, place only. Moves all extremities Psychiatry: judgement and insight appear poor. Appropriate mood and affect\    Data Reviewed: I have personally reviewed following labs and imaging studies  CBC: Recent Labs  Lab 11/06/21 1102 11/07/21 0810 11/08/21 0547  WBC 8.3 11.0* 11.0*  HGB 12.0 10.1* 7.8*  HCT 35.8* 30.2* 23.1*  MCV 93.7 93.2 92.4  PLT 207 183 163   Basic Metabolic Panel: Recent Labs  Lab 11/06/21 1202 11/07/21 0810 11/08/21 0547  NA 139 140 138  K 3.7 4.4 4.2  CL 107 109 107  CO2 24 27 25   GLUCOSE 147* 126* 117*  BUN 18 20 18   CREATININE 0.65 0.70 0.67  CALCIUM 9.4 9.4 8.5*   GFR: Estimated Creatinine Clearance: 57.6 mL/min (by C-G formula based on SCr of 0.67 mg/dL). Liver Function Tests: No results for input(s): AST, ALT, ALKPHOS, BILITOT, PROT, ALBUMIN in the last 168 hours. No results for input(s): LIPASE, AMYLASE in the last 168 hours. No results for input(s): AMMONIA in the last 168 hours. Coagulation Profile: Recent Labs  Lab 11/06/21 1115  INR 1.0   Cardiac Enzymes: No results for input(s): CKTOTAL, CKMB, CKMBINDEX, TROPONINI in the last 168 hours. BNP (last 3 results) No results for input(s): PROBNP in  the last 8760 hours. HbA1C: No results for input(s): HGBA1C in the last 72 hours. CBG: No results for input(s): GLUCAP in the last 168 hours. Lipid Profile: No results for input(s): CHOL, HDL, LDLCALC, TRIG, CHOLHDL, LDLDIRECT in the last 72 hours. Thyroid Function Tests: No results for input(s): TSH, T4TOTAL, FREET4, T3FREE, THYROIDAB in the last 72 hours. Anemia Panel: No results for input(s): VITAMINB12, FOLATE, FERRITIN, TIBC, IRON, RETICCTPCT in the last 72 hours. Sepsis Labs: No results for input(s): PROCALCITON, LATICACIDVEN in the last 168 hours.  Recent Results (from the past 240 hour(s))  Resp Panel by RT-PCR (Flu A&B, Covid) Nasopharyngeal Swab      Status: None   Collection Time: 11/06/21  1:38 PM   Specimen: Nasopharyngeal Swab; Nasopharyngeal(NP) swabs in vial transport medium  Result Value Ref Range Status   SARS Coronavirus 2 by RT PCR NEGATIVE NEGATIVE Final    Comment: (NOTE) SARS-CoV-2 target nucleic acids are NOT DETECTED.  The SARS-CoV-2 RNA is generally detectable in upper respiratory specimens during the acute phase of infection. The lowest concentration of SARS-CoV-2 viral copies this assay can detect is 138 copies/mL. A negative result does not preclude SARS-Cov-2 infection and should not be used as the sole basis for treatment or other patient management decisions. A negative result may occur with  improper specimen collection/handling, submission of specimen other than nasopharyngeal swab, presence of viral mutation(s) within the areas targeted by this assay, and inadequate number of viral copies(<138 copies/mL). A negative result must be combined with clinical observations, patient history, and epidemiological information. The expected result is Negative.  Fact Sheet for Patients:  BloggerCourse.com  Fact Sheet for Healthcare Providers:  SeriousBroker.it  This test is no t yet approved or cleared by the [place] FDA and  has been authorized for detection and/or diagnosis of SARS-CoV-2 by FDA under an Emergency Use Authorization (EUA). This EUA will remain  in effect (meaning this test can be used) for the duration of the COVID-19 declaration under Section 564(b)(1) of the Act, 21 U.S.C.section 360bbb-3(b)(1), unless the authorization is terminated  or revoked sooner.       Influenza A by PCR NEGATIVE NEGATIVE Final   Influenza B by PCR NEGATIVE NEGATIVE Final    Comment: (NOTE) The Xpert Xpress SARS-CoV-2/FLU/RSV plus assay is intended as an aid in the diagnosis of influenza from Nasopharyngeal swab specimens and should not be used as a sole basis  for treatment. Nasal washings and aspirates are unacceptable for Xpert Xpress SARS-CoV-2/FLU/RSV testing.  Fact Sheet for Patients: BloggerCourse.com  Fact Sheet for Healthcare Providers: SeriousBroker.it  This test is not yet approved or cleared by the [place] FDA and has been authorized for detection and/or diagnosis of SARS-CoV-2 by FDA under an Emergency Use Authorization (EUA). This EUA will remain in effect (meaning this test can be used) for the duration of the COVID-19 declaration under Section 564(b)(1) of the Act, 21 U.S.C. section 360bbb-3(b)(1), unless the authorization is terminated or revoked.  Performed at [facility], [address] [place], [place] [address]          Radiology Studies: CT HEAD WO CONTRAST ( )  Result Date: 11/06/2021 CLINICAL DATA:  75 year old female with history of head trauma after a fall. EXAM: CT HEAD WITHOUT CONTRAST CT CERVICAL SPINE WITHOUT CONTRAST TECHNIQUE: Multidetector CT imaging of the head and cervical spine was performed following the standard protocol without intravenous contrast. Multiplanar CT image reconstructions of the cervical spine were also generated. COMPARISON:  No priors.  FINDINGS: CT HEAD FINDINGS Brain: Moderate cerebral atrophy. Patchy and confluent areas of decreased attenuation are noted throughout the deep and periventricular white matter of the cerebral hemispheres bilaterally, compatible with chronic microvascular ischemic disease. Well-defined area of low attenuation in the left basal ganglia, compatible with a small lacunar infarct. Physiologic calcification also noted in the left basal ganglia. No evidence of acute infarction, hemorrhage, hydrocephalus, extra-axial collection or mass lesion/mass effect. Vascular: No hyperdense vessel or unexpected calcification. Skull: Normal. Negative for fracture or focal lesion. Sinuses/Orbits: No acute finding.  Other: None. CT CERVICAL SPINE FINDINGS Alignment: Normal. Skull base and vertebrae: No acute fracture. No primary bone lesion or focal pathologic process. Soft tissues and spinal canal: No prevertebral fluid or swelling. No visible canal hematoma. Disc levels: Mild multilevel degenerative disc disease most evident at C6-C7. Mild multilevel facet arthropathy. Upper chest: Unremarkable. Other: None. IMPRESSION: 1. No evidence of significant acute traumatic injury to the skull, brain or cervical spine. 2. Moderate cerebral atrophy with extensive chronic microvascular ischemic changes in the cerebral white matter and old left basal ganglia lacunar infarct, as above. 3. Multilevel degenerative disc disease and cervical spondylosis, as above. Electronically Signed   By: Trudie Reed M.D.   On: 11/06/2021 12:14   CT Cervical Spine Wo Contrast  Result Date: 11/06/2021 CLINICAL DATA:  75 year old female with history of head trauma after a fall. EXAM: CT HEAD WITHOUT CONTRAST CT CERVICAL SPINE WITHOUT CONTRAST TECHNIQUE: Multidetector CT imaging of the head and cervical spine was performed following the standard protocol without intravenous contrast. Multiplanar CT image reconstructions of the cervical spine were also generated. COMPARISON:  No priors. FINDINGS: CT HEAD FINDINGS Brain: Moderate cerebral atrophy. Patchy and confluent areas of decreased attenuation are noted throughout the deep and periventricular white matter of the cerebral hemispheres bilaterally, compatible with chronic microvascular ischemic disease. Well-defined area of low attenuation in the left basal ganglia, compatible with a small lacunar infarct. Physiologic calcification also noted in the left basal ganglia. No evidence of acute infarction, hemorrhage, hydrocephalus, extra-axial collection or mass lesion/mass effect. Vascular: No hyperdense vessel or unexpected calcification. Skull: Normal. Negative for fracture or focal lesion.  Sinuses/Orbits: No acute finding. Other: None. CT CERVICAL SPINE FINDINGS Alignment: Normal. Skull base and vertebrae: No acute fracture. No primary bone lesion or focal pathologic process. Soft tissues and spinal canal: No prevertebral fluid or swelling. No visible canal hematoma. Disc levels: Mild multilevel degenerative disc disease most evident at C6-C7. Mild multilevel facet arthropathy. Upper chest: Unremarkable. Other: None. IMPRESSION: 1. No evidence of significant acute traumatic injury to the skull, brain or cervical spine. 2. Moderate cerebral atrophy with extensive chronic microvascular ischemic changes in the cerebral white matter and old left basal ganglia lacunar infarct, as above. 3. Multilevel degenerative disc disease and cervical spondylosis, as above. Electronically Signed   By: Trudie Reed M.D.   On: 11/06/2021 12:14   CT Hip Left Wo Contrast  Result Date: 11/06/2021 CLINICAL DATA:  Evaluate left hip fracture. EXAM: CT OF THE LEFT HIP WITHOUT CONTRAST TECHNIQUE: Multidetector CT imaging of the left hip was performed according to the standard protocol. Multiplanar CT image reconstructions were also generated. COMPARISON:  None. FINDINGS: There is a displaced and angulated subtrochanteric fracture of the left hip. There is also fairly marked shortening. The fracture does involve the lesser trochanter which is able stand displaced up near the upper aspect of the femoral neck. The femoral head is normally located. Minimal degenerative changes. The visualized left hemipelvic  bony structures are intact. No acetabular or pubic rami fractures. Moderate degenerative changes noted at the pubic symphysis. There is associated hematoma involving the surrounding hip muscles. No significant intrapelvic abnormalities are identified. IMPRESSION: 1. Displaced, angulated and shortened largely subtrochanteric fracture as detailed above. 2. No pelvic fractures. Electronically Signed   By: Rudie Meyer  M.D.   On: 11/06/2021 12:37   DG C-Arm 1-60 Min-No Report  Result Date: 11/07/2021 Fluoroscopy was utilized by the requesting physician.  No radiographic interpretation.   DG Hip [place] W or [place] Pelvis 1 View Left  Result Date: 11/06/2021 CLINICAL DATA:  LEFT hip fracture preparation for surgery. EXAM: DG HIP (WITH OR WITHOUT PELVIS) 1V PORT LEFT COMPARISON:  CT of November 06, 2021. FINDINGS: Displaced, angulated and foreshortened fracture of the subtrochanteric LEFT femur extending into the lesser trochanter is similar to recent CT imaging. No substantial change on AP view. IMPRESSION: Left femoral fracture as above. No substantial change on AP view. Electronically Signed   By: Donzetta Kohut M.D.   On: 11/06/2021 14:17   DG FEMUR MIN 2 VIEWS LEFT  Result Date: 11/07/2021 CLINICAL DATA:  IM nail EXAM: LEFT FEMUR 2 VIEWS COMPARISON:  11/06/2021 FINDINGS: Four low resolution intraoperative spot views of the left femur. Total fluoroscopy time was 3 minutes 12 seconds. The images demonstrate intramedullary rod and distal screw fixation of the left femur with proximal cerclage wire for proximal femoral fracture. There is mild residual displacement. IMPRESSION: Intraoperative fluoroscopic assistance provided during surgical fixation of left femur fracture Electronically Signed   By: Jasmine Pang M.D.   On: 11/07/2021 18:03        Scheduled Meds:  amLODipine  5 mg Oral Daily   cholecalciferol  1,000 Units Oral Daily   docusate sodium  100 mg Oral BID   donepezil  5 mg Oral Daily   enoxaparin (LOVENOX) injection  40 mg Subcutaneous Q24H   feeding supplement  237 mL Oral BID BM   ferrous sulfate  325 mg Oral Q breakfast   memantine  5 mg Oral BID   multivitamin with minerals  1 tablet Oral Daily   senna-docusate  1 tablet Oral BID   Continuous Infusions:  sodium chloride 75 mL/hr at 11/08/21 0446    ceFAZolin (ANCEF) IV Stopped (11/08/21 0216)     LOS: 2 days    Time spent: 25  mins     Charise Killian, MD [facility] Pager [phone]  If 7PM-7AM, please contact night-coverage 11/08/2021, 8:34 AM

## 2021-11-08 NOTE — Evaluation (Signed)
Physical Therapy Evaluation Patient Details Name: Cheyenne Parker MRN: [id number] DOB: 1946-02-09 Today's Date: 11/08/2021  History of Present Illness  Pt is a 75 yo female s/p L hip IM nailing, WBAT. PMH of HTN, dementia, R hip fx.  Clinical Impression  Pt alert, restless throughout session. Oriented to self only, but did know [place] as a town. Unable to provide PLOF, but per chart review pt from home. Pt exhibited moderate-significant pain signs/symptoms with mobility.  A few supine exercises attempted, but pt resistant to LLE movement. HOB elevated and pt able to initiate transition to EOB, but maxA to complete. She was able to sit with CGA and BUE propped for several minutes. HR ranging from 110s-138 BPM. Pt restless, trying to remove gown and reposition constantly. Sit <> stand with maxAx2 and RW, pt was able to come up into standing and progressed to maintaining standing with minAx2. Unable to take any steps at this time. Returned to supine in bed with maxAx2 for safety.  Overall the patient demonstrated deficits (see "PT Problem List") that impede the patient's functional abilities, safety, and mobility and would benefit from skilled PT intervention. Recommendation is SNF at this due to decline in functional status and current level of assistance needed.       Recommendations for follow up therapy are one component of a multi-disciplinary discharge planning process, led by the attending physician.  Recommendations may be updated based on patient status, additional functional criteria and insurance authorization.  Follow Up Recommendations Skilled nursing-short term rehab (<3 hours/day)    Assistance Recommended at Discharge Frequent or constant Supervision/Assistance  Functional Status Assessment    Equipment Recommendations  Rolling walker (2 wheels)    Recommendations for Other Services OT consult     Precautions / Restrictions Precautions Precautions: Fall Restrictions Weight  Bearing Restrictions: Yes LLE Weight Bearing: Weight bearing as tolerated      Mobility  Bed Mobility Overal bed mobility: Needs Assistance Bed Mobility: Supine to Sit;Sit to Supine     Supine to sit: Max assist;HOB elevated Sit to supine: Max assist;+2 for safety/equipment        Transfers Overall transfer level: Needs assistance Equipment used: Rolling walker (2 wheels) Transfers: Sit to/from Stand Sit to Stand: Max assist;+2 physical assistance                Ambulation/Gait               General Gait Details: unable at this time  Information systems manager Rankin (Stroke Patients Only)       Balance                                             Pertinent Vitals/Pain Pain Assessment: Faces Faces Pain Scale: Hurts whole lot Pain Location: with mobility attempts Pain Descriptors / Indicators: Grimacing;Guarding;Crying;Moaning Pain Intervention(s): Limited activity within patient's tolerance;Monitored during session;Repositioned    Home Living Family/patient expects to be discharged to:: Private residence Living Arrangements: Spouse/significant other Available Help at Discharge: Family Type of Home: House Home Access: Stairs to enter Entrance Stairs-Rails: Right Entrance Stairs-Number of Steps: 2   Home Layout: One level Home Equipment: Grab bars - tub/shower;Grab bars - toilet;BSC/3in1;Shower seat - built in Additional Comments: PLOF gathered from chart review, though needs to be verified  with family/pt as able    Prior Function Prior Level of Function : Patient poor historian/Family not available                     Hand Dominance        Extremity/Trunk Assessment   Upper Extremity Assessment Upper Extremity Assessment: Difficult to assess due to impaired cognition    Lower Extremity Assessment Lower Extremity Assessment: Difficult to assess due to impaired  cognition;Generalized weakness       Communication      Cognition Arousal/Alertness: Awake/alert Behavior During Therapy: Restless;Anxious Overall Cognitive Status: No family/caregiver present to determine baseline cognitive functioning                                 General Comments: oriented to self, birthday, and town ([place])        General Comments      Exercises     Assessment/Plan    PT Assessment Patient needs continued PT services  PT Problem List Decreased strength;Decreased mobility;Decreased range of motion;Decreased knowledge of precautions;Decreased activity tolerance;Decreased balance;Decreased knowledge of use of DME;Pain       PT Treatment Interventions DME instruction;Therapeutic exercise;Balance training;Gait training;Stair training;Neuromuscular re-education;Functional mobility training;Therapeutic activities;Patient/family education    PT Goals (Current goals can be found in the Care Plan section)  Acute Rehab PT Goals PT Goal Formulation: Patient unable to participate in goal setting Time For Goal Achievement: 11/22/21 Potential to Achieve Goals: Fair    Frequency 7X/week   Barriers to discharge        Co-evaluation               AM-PAC PT "6 Clicks" Mobility  Outcome Measure Help needed turning from your back to your side while in a flat bed without using bedrails?: A Lot Help needed moving from lying on your back to sitting on the side of a flat bed without using bedrails?: A Lot Help needed moving to and from a bed to a chair (including a wheelchair)?: Total Help needed standing up from a chair using your arms (e.g., wheelchair or bedside chair)?: Total Help needed to walk in hospital room?: Total Help needed climbing 3-5 steps with a railing? : Total 6 Click Score: 8    End of Session Equipment Utilized During Treatment: Gait belt Activity Tolerance: Patient limited by pain Patient left: in bed;with call  bell/phone within reach;with family/visitor present;with nursing/sitter in room;with bed alarm set;with SCD's reapplied Nurse Communication: Mobility status PT Visit Diagnosis: Other abnormalities of gait and mobility (R26.89);Difficulty in walking, not elsewhere classified (R26.2);Muscle weakness (generalized) (M62.81);Pain Pain - Right/Left: Left Pain - part of body: Hip    Time: 2979-8921 PT Time Calculation (min) (ACUTE ONLY): 23 min   Charges:   PT Evaluation $PT Eval Low Complexity: 1 Low PT Treatments $Therapeutic Activity: 23-37 mins        Olga Coaster PT, DPT 11:11 AM,11/08/21

## 2021-11-09 DIAGNOSIS — D62 Acute posthemorrhagic anemia: Secondary | ICD-10-CM

## 2021-11-09 LAB — CBC
HCT: 19.2 % — ABNORMAL LOW (ref 36.0–46.0)
Hemoglobin: 6.5 g/dL — ABNORMAL LOW (ref 12.0–15.0)
MCH: 31.3 pg (ref 26.0–34.0)
MCHC: 33.9 g/dL (ref 30.0–36.0)
MCV: 92.3 fL (ref 80.0–100.0)
Platelets: 135 10*3/uL — ABNORMAL LOW (ref 150–400)
RBC: 2.08 MIL/uL — ABNORMAL LOW (ref 3.87–5.11)
RDW: 12.7 % (ref 11.5–15.5)
WBC: 9 10*3/uL (ref 4.0–10.5)
nRBC: 0 % (ref 0.0–0.2)

## 2021-11-09 LAB — PREPARE RBC (CROSSMATCH)

## 2021-11-09 LAB — HEMOGLOBIN AND HEMATOCRIT, BLOOD
HCT: 25 % — ABNORMAL LOW (ref 36.0–46.0)
Hemoglobin: 8.8 g/dL — ABNORMAL LOW (ref 12.0–15.0)

## 2021-11-09 LAB — BASIC METABOLIC PANEL
Anion gap: 5 (ref 5–15)
BUN: 15 mg/dL (ref 8–23)
CO2: 26 mmol/L (ref 22–32)
Calcium: 8.5 mg/dL — ABNORMAL LOW (ref 8.9–10.3)
Chloride: 107 mmol/L (ref 98–111)
Creatinine, Ser: 0.62 mg/dL (ref 0.44–1.00)
GFR, Estimated: >60 mL/min (ref >60)
Glucose, Bld: 106 mg/dL — ABNORMAL HIGH (ref 70–99)
Potassium: 4.1 mmol/L (ref 3.5–5.1)
Sodium: 138 mmol/L (ref 135–145)

## 2021-11-09 MED ORDER — SODIUM CHLORIDE 0.9% IV SOLUTION: Freq: Once | INTRAVENOUS | Status: AC

## 2021-11-09 NOTE — Progress Notes (Signed)
°  Subjective: 2 Days Post-Op Procedure(s) (LRB): INTRAMEDULLARY (IM) NAIL FEMORAL (Left) Patient reports pain as mild.   Patient is well, confusion has improved no longer wearing safety mittens. Patient is still oriented to herself, has to be reminded that she had surgery. Plan is for discharge to SNF after admission. Negative for chest pain and shortness of breath Fever: no Gastrointestinal:Negative for nausea and vomiting  Objective: Vital signs in last 24 hours: Temp:  [97.3 F (36.3 C)-99.3 F (37.4 C)] 99.2 F (37.3 C) (12/18 0718) Pulse Rate:  [93-108] 99 (12/18 0718) Resp:  [16-18] 18 (12/18 0718) BP: (104-131)/(44-65) 121/44 (12/18 0718) SpO2:  [95 %-100 %] 95 % (12/18 0718)  Intake/Output from previous day:  Intake/Output Summary (Last 24 hours) at 11/09/2021 0814 Last data filed at 11/09/2021 0500 Gross per 24 hour  Intake 1676.91 ml  Output 0 ml  Net 1676.91 ml    Intake/Output this shift: No intake/output data recorded.  Labs: Recent Labs    11/06/21 1102 11/07/21 0810 11/08/21 0547 11/09/21 0454  HGB 12.0 10.1* 7.8* 6.5*   Recent Labs    11/08/21 0547 11/09/21 0454  WBC 11.0* 9.0  RBC 2.50* 2.08*  HCT 23.1* 19.2*  PLT 163 135*   Recent Labs    11/08/21 0547 11/09/21 0454  NA 138 138  K 4.2 4.1  CL 107 107  CO2 25 26  BUN 18 15  CREATININE 0.67 0.62  GLUCOSE 117* 106*  CALCIUM 8.5* 8.5*   Recent Labs    11/06/21 1115  INR 1.0     EXAM General - Patient is Alert and oriented to self. Extremity - ABD soft Neurovascular intact Dorsiflexion/Plantar flexion intact Incision: dressing C/D/I No cellulitis present Compartment soft Thigh is soft to palpation. Dressing/Incision - Bulky dressing removed.  No bloody drainage noted, ecchymosis present without any erythema. Motor Function - intact, moving foot and toes well on exam.  Abdomen soft with normal bowel sounds.  Past Medical History:  Diagnosis Date   Anemia     Hyperlipidemia    IFG (impaired fasting glucose)    Obesity    Osteoporosis    Vitamin D deficiency disease     Assessment/Plan: 2 Days Post-Op Procedure(s) (LRB): INTRAMEDULLARY (IM) NAIL FEMORAL (Left) Principal Problem:   Closed left hip fracture (HCC) Active Problems:   Essential hypertension   Fall at home, initial encounter   Dementia (HCC)  Estimated body mass index is 29.23 kg/m as calculated from the following:   Height as of this encounter: 5\' 2"  (1.575 m).   Weight as of this encounter: 72.5 kg. Advance diet Up with therapy D/C IV fluids when tolerating po intake.  Labs reviewed this AM, Hg 6.5, internal medicine has ordered transfusion for the patient. No significant bloody drainage noted to the left thigh during dressing change. Patient with history of dementia, oriented to self. UP with therapy today, WBAT to the left leg.  Will need SNF after discharge. Continue to work on BM.  DVT Prophylaxis - Lovenox Weight-Bearing as tolerated to left leg  J. , PA-C [facility] 11/09/2021, 8:14 AM

## 2021-11-09 NOTE — Progress Notes (Signed)
PROGRESS NOTE    Cheyenne Parker  HQP:[id number] DOB: [birth year] DOA: 11/06/2021 PCP: Corky Downs, MD   Assessment & Plan:   Principal Problem:   Closed left hip fracture [facility]) Active Problems:   Essential hypertension   Fall at home, initial encounter   Dementia [facility])     Closed left hip fracture: s/p left intramedullary nail femoral on 12/16 as per ortho surg. Tylenol, percocet prn. PT/OT recs SNF  Leukocytosis: resolved   Normocytic anemia:  w/ likely component of acute blood loss anemia secondary to above surg. Will transfuse 1 unit of pRBCs today secondary to Hb of 6.5. Repeat H&H 4 hours post transfusion.    HTN: continue on home dose of CCB. IV hydralazine    Fall: PT/OT recs SNF   Dementia: continue on home dose of nameda, aricept   Thrombocytopenia: etiology unclear. Will continue to monitor     DVT prophylaxis: SCDs Code Status: full  Family Communication: called pt's husband, Homero Fellers, no answer so I left a message  Disposition Plan: likely d/c to SNF  Level of care: Med-Surg  Status is: Inpatient  Remains inpatient appropriate because: severity of illness, Hb 6.5, needs 1 unit of pRBCs today     Consultants:  Ortho surg   Procedures:   Antimicrobials:   Subjective: Pt is pleasantly confused   Objective: Vitals:   11/09/21 0425 11/09/21 0718 11/09/21 1103 11/09/21 1130  BP: (!) 118/50 (!) 121/44 (!) 137/59 (!) 117/56  Pulse: 93 99 (!) 103 100  Resp: 16 18 16 14   Temp: 99.3 F (37.4 C) 99.2 F (37.3 C) 98.8 F (37.1 C) 98.2 F (36.8 C)  TempSrc: Oral  Axillary Oral  SpO2: 97% 95% 98% 100%  Weight:      Height:        Intake/Output Summary (Last 24 hours) at 11/09/2021 1333 Last data filed at 11/09/2021 0500 Gross per 24 hour  Intake 1496.91 ml  Output 0 ml  Net 1496.91 ml   Filed Weights   11/06/21 1058  Weight: 72.5 kg    Examination:  General exam: Appears calm & comfortable   Respiratory system: clear breath sounds b/l   Cardiovascular system: S1 & S2+. No rubs or clicks  Gastrointestinal system: Abd is soft, NT, ND & normal bowel sounds  Central nervous system: Alert and awake. Moves all extremities  Psychiatry: judgement and insight appear poor. Appropriate mood and affect     Data Reviewed: I have personally reviewed following labs and imaging studies  CBC: Recent Labs  Lab 11/06/21 1102 11/07/21 0810 11/08/21 0547 11/09/21 0454  WBC 8.3 11.0* 11.0* 9.0  HGB 12.0 10.1* 7.8* 6.5*  HCT 35.8* 30.2* 23.1* 19.2*  MCV 93.7 93.2 92.4 92.3  PLT 207 183 163 135*   Basic Metabolic Panel: Recent Labs  Lab 11/06/21 1202 11/07/21 0810 11/08/21 0547 11/09/21 0454  NA 139 140 138 138  K 3.7 4.4 4.2 4.1  CL 107 109 107 107  CO2 24 27 25 26   GLUCOSE 147* 126* 117* 106*  BUN 18 20 18 15   CREATININE 0.65 0.70 0.67 0.62  CALCIUM 9.4 9.4 8.5* 8.5*   GFR: Estimated Creatinine Clearance: 57.6 mL/min (by C-G formula based on SCr of 0.62 mg/dL). Liver Function Tests: No results for input(s): AST, ALT, ALKPHOS, BILITOT, PROT, ALBUMIN in the last 168 hours. No results for input(s): LIPASE, AMYLASE in the last 168 hours. No results for input(s): AMMONIA in the last 168 hours. Coagulation Profile: Recent Labs  Lab 11/06/21 1115  INR 1.0   Cardiac Enzymes: No results for input(s): CKTOTAL, CKMB, CKMBINDEX, TROPONINI in the last 168 hours. BNP (last 3 results) No results for input(s): PROBNP in the last 8760 hours. HbA1C: No results for input(s): HGBA1C in the last 72 hours. CBG: No results for input(s): GLUCAP in the last 168 hours. Lipid Profile: No results for input(s): CHOL, HDL, LDLCALC, TRIG, CHOLHDL, LDLDIRECT in the last 72 hours. Thyroid Function Tests: No results for input(s): TSH, T4TOTAL, FREET4, T3FREE, THYROIDAB in the last 72 hours. Anemia Panel: No results for input(s): VITAMINB12, FOLATE, FERRITIN, TIBC, IRON, RETICCTPCT in the last 72 hours. Sepsis Labs: No results for  input(s): PROCALCITON, LATICACIDVEN in the last 168 hours.  Recent Results (from the past 240 hour(s))  Resp Panel by RT-PCR (Flu A&B, Covid) Nasopharyngeal Swab     Status: None   Collection Time: 11/06/21  1:38 PM   Specimen: Nasopharyngeal Swab; Nasopharyngeal(NP) swabs in vial transport medium  Result Value Ref Range Status   SARS Coronavirus 2 by RT PCR NEGATIVE NEGATIVE Final    Comment: (NOTE) SARS-CoV-2 target nucleic acids are NOT DETECTED.  The SARS-CoV-2 RNA is generally detectable in upper respiratory specimens during the acute phase of infection. The lowest concentration of SARS-CoV-2 viral copies this assay can detect is 138 copies/mL. A negative result does not preclude SARS-Cov-2 infection and should not be used as the sole basis for treatment or other patient management decisions. A negative result may occur with  improper specimen collection/handling, submission of specimen other than nasopharyngeal swab, presence of viral mutation(s) within the areas targeted by this assay, and inadequate number of viral copies(<138 copies/mL). A negative result must be combined with clinical observations, patient history, and epidemiological information. The expected result is Negative.  Fact Sheet for Patients:  BloggerCourse.com  Fact Sheet for Healthcare Providers:  SeriousBroker.it  This test is no t yet approved or cleared by the [place] FDA and  has been authorized for detection and/or diagnosis of SARS-CoV-2 by FDA under an Emergency Use Authorization (EUA). This EUA will remain  in effect (meaning this test can be used) for the duration of the COVID-19 declaration under Section 564(b)(1) of the Act, 21 U.S.C.section 360bbb-3(b)(1), unless the authorization is terminated  or revoked sooner.       Influenza A by PCR NEGATIVE NEGATIVE Final   Influenza B by PCR NEGATIVE NEGATIVE Final    Comment: (NOTE) The  Xpert Xpress SARS-CoV-2/FLU/RSV plus assay is intended as an aid in the diagnosis of influenza from Nasopharyngeal swab specimens and should not be used as a sole basis for treatment. Nasal washings and aspirates are unacceptable for Xpert Xpress SARS-CoV-2/FLU/RSV testing.  Fact Sheet for Patients: BloggerCourse.com  Fact Sheet for Healthcare Providers: SeriousBroker.it  This test is not yet approved or cleared by the [place] FDA and has been authorized for detection and/or diagnosis of SARS-CoV-2 by FDA under an Emergency Use Authorization (EUA). This EUA will remain in effect (meaning this test can be used) for the duration of the COVID-19 declaration under Section 564(b)(1) of the Act, 21 U.S.C. section 360bbb-3(b)(1), unless the authorization is terminated or revoked.  Performed at [facility], [address] [place], [place] [address]          Radiology Studies: DG C-Arm 1-60 Min-No Report  Result Date: 11/07/2021 Fluoroscopy was utilized by the requesting physician.  No radiographic interpretation.   DG FEMUR MIN 2 VIEWS LEFT  Result Date: 11/07/2021 CLINICAL  DATA:  IM nail EXAM: LEFT FEMUR 2 VIEWS COMPARISON:  11/06/2021 FINDINGS: Four low resolution intraoperative spot views of the left femur. Total fluoroscopy time was 3 minutes 12 seconds. The images demonstrate intramedullary rod and distal screw fixation of the left femur with proximal cerclage wire for proximal femoral fracture. There is mild residual displacement. IMPRESSION: Intraoperative fluoroscopic assistance provided during surgical fixation of left femur fracture Electronically Signed   By: Jasmine Pang M.D.   On: 11/07/2021 18:03        Scheduled Meds:  amLODipine  5 mg Oral Daily   cholecalciferol  1,000 Units Oral Daily   docusate sodium  100 mg Oral BID   donepezil  5 mg Oral Daily   enoxaparin (LOVENOX) injection  40 mg  Subcutaneous Q24H   feeding supplement  237 mL Oral BID BM   ferrous sulfate  325 mg Oral Q breakfast   memantine  5 mg Oral BID   multivitamin with minerals  1 tablet Oral Daily   senna-docusate  1 tablet Oral BID   Continuous Infusions:     LOS: 3 days    Time spent: 15 mins     Charise Killian, MD [facility] Pager [phone]  If 7PM-7AM, please contact night-coverage 11/09/2021, 1:33 PM

## 2021-11-09 NOTE — Progress Notes (Signed)
OT Cancellation Note  Patient Details Name: Cheyenne Parker MRN: [id number] DOB: 12/01/1945   Cancelled Treatment:    Reason Eval/Treat Not Completed: Patient not medically ready. Pt's most recent blood work documents hemoglobin of 6.5, hematocrit of 19.2. Pt receiving transfusion. Will hold OT evaluation pending status of lab levels post transfusion.   Latina Craver, PhD, MS, OTR/L 11/09/21, 8:26 AM

## 2021-11-09 NOTE — Progress Notes (Signed)
Physical Therapy Treatment Patient Details Name: Cheyenne Parker MRN: [id number] DOB: Dec 13, 1945 Today's Date: 11/09/2021   History of Present Illness Pt is a 75 yo female s/p L hip IM nailing, WBAT. PMH of HTN, dementia, R hip fx.    PT Comments    Patient finishing up with getting a unit of blood. Patient is confused (unaware that she had a fracture or surgery) but is able to follow commands with extra time. Patient participated with LE exercises and repositioning in the bed. She declined sitting up on edge of bed due to pain. Slow progress overall. Recommend SNF placement at discharge to continue with PT to maximize independence and facilitate return to prior level of function.    Recommendations for follow up therapy are one component of a multi-disciplinary discharge planning process, led by the attending physician.  Recommendations may be updated based on patient status, additional functional criteria and insurance authorization.  Follow Up Recommendations  Skilled nursing-short term rehab (<3 hours/day)     Assistance Recommended at Discharge Frequent or constant Supervision/Assistance  Equipment Recommendations  Rolling walker (2 wheels)    Recommendations for Other Services       Precautions / Restrictions Precautions Precautions: Fall Restrictions Weight Bearing Restrictions: Yes LLE Weight Bearing: Weight bearing as tolerated     Mobility  Bed Mobility Overal bed mobility: Needs Assistance Bed Mobility: Rolling Rolling: Max assist         General bed mobility comments: assistance for trunk support. verbal cues for sequencing and technique. patient declined sitting up on edge of bed due to pain    Transfers                        Ambulation/Gait                   Stairs             Wheelchair Mobility    Modified Rankin (Stroke Patients Only)       Balance                                             Cognition Arousal/Alertness: Awake/alert Behavior During Therapy: WFL for tasks assessed/performed Overall Cognitive Status: No family/caregiver present to determine baseline cognitive functioning                                 General Comments: patient is disorineted to time, situation, place. she is able to follow single step commands with extra time. was unaware that she had surgery        Exercises General Exercises - Lower Extremity Ankle Circles/Pumps: AROM;Strengthening;Both;10 reps;Supine Heel Slides: AAROM;Strengthening;Both;10 reps;Supine Hip ABduction/ADduction: AAROM;Strengthening;Both;10 reps;Supine Other Exercises Other Exercises: verbal and visual cues for exercise technique.    General Comments        Pertinent Vitals/Pain Pain Assessment: Faces Faces Pain Scale: Hurts even more Pain Location: left leg Pain Descriptors / Indicators: Grimacing;Guarding Pain Intervention(s): Limited activity within patient's tolerance;Monitored during session;Ice applied (ice pack applied to left thigh at end of session)    Home Living                          Prior Function  PT Goals (current goals can now be found in the care plan section) Acute Rehab PT Goals Patient Stated Goal: none stated PT Goal Formulation: Patient unable to participate in goal setting Time For Goal Achievement: 11/22/21 Potential to Achieve Goals: Fair Progress towards PT goals: Progressing toward goals    Frequency    7X/week      PT Plan Current plan remains appropriate    Co-evaluation              AM-PAC PT "6 Clicks" Mobility   Outcome Measure  Help needed turning from your back to your side while in a flat bed without using bedrails?: A Lot Help needed moving from lying on your back to sitting on the side of a flat bed without using bedrails?: A Lot Help needed moving to and from a bed to a chair (including a wheelchair)?: Total Help  needed standing up from a chair using your arms (e.g., wheelchair or bedside chair)?: Total Help needed to walk in hospital room?: Total Help needed climbing 3-5 steps with a railing? : Total 6 Click Score: 8    End of Session   Activity Tolerance: Patient limited by pain Patient left: in bed;with call bell/phone within reach;with bed alarm set (set-up with llunch tray) Nurse Communication: Mobility status PT Visit Diagnosis: Other abnormalities of gait and mobility (R26.89);Difficulty in walking, not elsewhere classified (R26.2);Muscle weakness (generalized) (M62.81);Pain Pain - Right/Left: Left Pain - part of body: Hip     Time: 6237-6283 PT Time Calculation (min) (ACUTE ONLY): 23 min  Charges:  $Therapeutic Exercise: 8-22 mins $Therapeutic Activity: 8-22 mins                     Cheyenne Parker, PT, MPT    Cheyenne Parker 11/09/2021, 3:40 PM

## 2021-11-10 ENCOUNTER — Inpatient Hospital Stay: Payer: Medicare Other

## 2021-11-10 ENCOUNTER — Encounter: Payer: Self-pay | Admitting: Orthopedic Surgery

## 2021-11-10 LAB — BASIC METABOLIC PANEL
Anion gap: 5 (ref 5–15)
BUN: 12 mg/dL (ref 8–23)
CO2: 28 mmol/L (ref 22–32)
Calcium: 8.5 mg/dL — ABNORMAL LOW (ref 8.9–10.3)
Chloride: 106 mmol/L (ref 98–111)
Creatinine, Ser: 0.51 mg/dL (ref 0.44–1.00)
GFR, Estimated: >60 mL/min (ref >60)
Glucose, Bld: 118 mg/dL — ABNORMAL HIGH (ref 70–99)
Potassium: 3.6 mmol/L (ref 3.5–5.1)
Sodium: 139 mmol/L (ref 135–145)

## 2021-11-10 LAB — CBC
HCT: 22 % — ABNORMAL LOW (ref 36.0–46.0)
Hemoglobin: 7.8 g/dL — ABNORMAL LOW (ref 12.0–15.0)
MCH: 31.2 pg (ref 26.0–34.0)
MCHC: 35.5 g/dL (ref 30.0–36.0)
MCV: 88 fL (ref 80.0–100.0)
Platelets: 156 10*3/uL (ref 150–400)
RBC: 2.5 MIL/uL — ABNORMAL LOW (ref 3.87–5.11)
RDW: 14.3 % (ref 11.5–15.5)
WBC: 9.6 10*3/uL (ref 4.0–10.5)
nRBC: 0.2 % (ref 0.0–0.2)

## 2021-11-10 MED ORDER — OXYCODONE-ACETAMINOPHEN 5-325 MG PO TABS: 1.0000 | ORAL_TABLET | Freq: Four times a day (QID) | ORAL | 0 refills | Status: AC | PRN

## 2021-11-10 MED ORDER — ENOXAPARIN SODIUM 40 MG/0.4ML IJ SOSY: 40.0000 mg | PREFILLED_SYRINGE | INTRAMUSCULAR | 0 refills | Status: AC

## 2021-11-10 MED ORDER — LORATADINE 10 MG PO TABS
10.0000 mg | ORAL_TABLET | Freq: Every day | ORAL | Status: DC
  Administered 2021-11-10 – 2021-11-11 (×2): 10 mg via ORAL
  Filled 2021-11-10 (×2): qty 1

## 2021-11-10 NOTE — Progress Notes (Signed)
Physical Therapy Treatment Patient Details Name: Cheyenne Parker MRN: [id number] DOB: [birth year] Today's Date: 11/10/2021   History of Present Illness Pt is a 75 yo female s/p L hip IM nailing, WBAT. PMH of HTN, dementia, R hip fx.    PT Comments    Pt seen this am after receiving pain meds. No pain at rest L hip/LE. Pt transitioned to edge of bed with ModA with slight increase in discomfort yet resolved after rest period. Pt assisted to bedside commode, upon standing pt experiences a soft "pop" at L hip, after seated rest period, pt stood again at [facility] with audible crepitus/cracking sound and immediate increase in pain at L hip. Nursing called in, pt assisted back to bed with MaxA with attention placed to supporting L LE. Pt positioned to comfort, able to rest yet slightest movement of L hip results in increased pain. Care team notified of concerns. L LE X-Ray ordered. Will hold until pt is cleared to continue PT.     Recommendations for follow up therapy are one component of a multi-disciplinary discharge planning process, led by the attending physician.  Recommendations may be updated based on patient status, additional functional criteria and insurance authorization.  Follow Up Recommendations  Skilled nursing-short term rehab (<3 hours/day)     Assistance Recommended at Discharge Frequent or constant Supervision/Assistance  Equipment Recommendations  Rolling walker (2 wheels)    Recommendations for Other Services OT consult     Precautions / Restrictions Precautions Precautions: Fall Restrictions Weight Bearing Restrictions: Yes LLE Weight Bearing: Weight bearing as tolerated     Mobility  Bed Mobility Overal bed mobility: Needs Assistance Bed Mobility: Rolling Rolling: Max assist   Supine to sit: Max assist;HOB elevated Sit to supine: Max assist        Transfers Overall transfer level: Needs assistance Equipment used: Rolling walker (2 wheels) Transfers: Sit to/from  Stand Sit to Stand: Max assist           General transfer comment:  (Pt unable to fully attain and maintain upright stance at RW or place supportive weight through L LE.)    Ambulation/Gait               General Gait Details: unable at this time   Stairs             Wheelchair Mobility    Modified Rankin (Stroke Patients Only)       Balance                                            Cognition Arousal/Alertness: Awake/alert Behavior During Therapy: WFL for tasks assessed/performed Overall Cognitive Status: No family/caregiver present to determine baseline cognitive functioning                                 General Comments: patient is disorineted to time, situation, place. she is able to follow single step commands with extra time. was unaware that she had surgery        Exercises General Exercises - Lower Extremity Ankle Circles/Pumps: AROM;Strengthening;Both;10 reps;Supine Heel Slides: AAROM;Left;10 reps Hip ABduction/ADduction: AAROM;Left;5 reps    General Comments General comments (skin integrity, edema, etc.):  (L hip dressing intact, purewick with pink tinge noted in urine, pt c/o urge to void, yet struggling with out-put.)  Pertinent Vitals/Pain Pain Assessment: Faces Faces Pain Scale: Hurts whole lot Facial Expression: facial grimacing Consolability: distracted or reassured by voice/touch Pain Location: left leg Pain Descriptors / Indicators: Grimacing;Guarding;Sharp;Shooting Pain Intervention(s): Other (comment);Limited activity within patient's tolerance;Ice applied (Care team notified of increased pain upon standing)    Home Living                          Prior Function            PT Goals (current goals can now be found in the care plan section) Acute Rehab PT Goals Patient Stated Goal: none stated    Frequency    7X/week      PT Plan Current plan remains appropriate     Co-evaluation              AM-PAC PT "6 Clicks" Mobility   Outcome Measure  Help needed turning from your back to your side while in a flat bed without using bedrails?: A Lot Help needed moving from lying on your back to sitting on the side of a flat bed without using bedrails?: A Lot Help needed moving to and from a bed to a chair (including a wheelchair)?: Total Help needed standing up from a chair using your arms (e.g., wheelchair or bedside chair)?: Total Help needed to walk in hospital room?: Total Help needed climbing 3-5 steps with a railing? : Total 6 Click Score: 8    End of Session Equipment Utilized During Treatment: Gait belt Activity Tolerance: Patient limited by fatigue;Treatment limited secondary to medical complications (Comment) (shooting pain Left hip after standing from commode.) Patient left: in bed;with call bell/phone within reach;with bed alarm set;with family/visitor present;with SCD's reapplied Nurse Communication: Mobility status;Other (comment) (Significant increase in pain L hip after standing.) PT Visit Diagnosis: Other abnormalities of gait and mobility (R26.89);Difficulty in walking, not elsewhere classified (R26.2);Muscle weakness (generalized) (M62.81);Pain Pain - Right/Left: Left Pain - part of body: Hip     Time: 1300-1340 PT Time Calculation (min) (ACUTE ONLY): 40 min  Charges:  $Therapeutic Exercise: 8-22 mins $Therapeutic Activity: 23-37 mins                    Zadie Cleverly, PTA   Jannet Askew 11/10/2021, 2:19 PM

## 2021-11-10 NOTE — Progress Notes (Signed)
°  Subjective: 3 Days Post-Op Procedure(s) (LRB): INTRAMEDULLARY (IM) NAIL FEMORAL (Left) Patient reports pain as mild.   Patient is well, oriented to self. Has to be reminded that she had surgery. Plan is for discharge to SNF after admission. Negative for chest pain and shortness of breath Fever: no Gastrointestinal:Negative for nausea and vomiting Patient underwent transfusion of one unit of PRBC yesterday.  Objective: Vital signs in last 24 hours: Temp:  [98.1 F (36.7 C)-100.8 F (38.2 C)] 98.1 F (36.7 C) (12/19 0347) Pulse Rate:  [73-109] 73 (12/19 0347) Resp:  [14-18] 16 (12/19 0347) BP: (117-139)/(45-88) 122/45 (12/19 0347) SpO2:  [96 %-100 %] 99 % (12/19 0347)  Intake/Output from previous day:  Intake/Output Summary (Last 24 hours) at 11/10/2021 0752 Last data filed at 11/09/2021 1900 Gross per 24 hour  Intake 500 ml  Output 400 ml  Net 100 ml    Intake/Output this shift: No intake/output data recorded.  Labs: Recent Labs    11/07/21 0810 11/08/21 0547 11/09/21 0454 11/09/21 1847 11/10/21 0332  HGB 10.1* 7.8* 6.5* 8.8* 7.8*   Recent Labs    11/09/21 0454 11/09/21 1847 11/10/21 0332  WBC 9.0  --  9.6  RBC 2.08*  --  2.50*  HCT 19.2* 25.0* 22.0*  PLT 135*  --  156   Recent Labs    11/09/21 0454 11/10/21 0332  NA 138 139  K 4.1 3.6  CL 107 106  CO2 26 28  BUN 15 12  CREATININE 0.62 0.51  GLUCOSE 106* 118*  CALCIUM 8.5* 8.5*   No results for input(s): LABPT, INR in the last 72 hours.    EXAM General - Patient is Alert and oriented to self. Extremity - ABD soft Neurovascular intact Dorsiflexion/Plantar flexion intact Incision: dressing C/D/I No cellulitis present Compartment soft Thigh is soft to palpation. Dressing/Incision - Honeycomb dressing applied, minimal bloody drainage noted. Motor Function - intact, moving foot and toes well on exam.  Abdomen soft with normal bowel sounds.  Past Medical History:  Diagnosis Date    Anemia    Hyperlipidemia    IFG (impaired fasting glucose)    Obesity    Osteoporosis    Vitamin D deficiency disease     Assessment/Plan: 3 Days Post-Op Procedure(s) (LRB): INTRAMEDULLARY (IM) NAIL FEMORAL (Left) Principal Problem:   Closed left hip fracture (HCC) Active Problems:   Essential hypertension   Fall at home, initial encounter   Dementia (HCC)  Estimated body mass index is 29.23 kg/m as calculated from the following:   Height as of this encounter: 5\' 2"  (1.575 m).   Weight as of this encounter: 72.5 kg. Up with therapy   Labs reviewed this AM, Hg 7.8 following transfusion. Patient with fever last night, 100.8.  Afebrile currently, denies any chest pain, cough or urinary symptoms. Honeycomb intact to the left leg. UP with therapy today, WBAT to the left leg.  Will need SNF after discharge. Continue to work on BM.  Follow-up with [facility] orthopaedics in 14 days following discharge for staple removal and x-rays. Continue Lovenox 40mg  daily for 14 days following discharge for DVT prophylaxis.  DVT Prophylaxis - Lovenox Weight-Bearing as tolerated to left leg  [facility], PA-C [facility] 11/10/2021, 7:52 AM

## 2021-11-10 NOTE — Discharge Instructions (Signed)

## 2021-11-10 NOTE — Care Management Important Message (Signed)
Important Message  Patient Details  Name: Cheyenne Parker MRN: [id number] Date of Birth: [birth year]   Medicare Important Message Given:  Yes     Johnell Comings 11/10/2021, 11:39 AM

## 2021-11-10 NOTE — Progress Notes (Signed)
PROGRESS NOTE    Cheyenne Parker  NOM:[id number] DOB: 1946-10-23 DOA: 11/06/2021 PCP: Corky Downs, MD   Assessment & Plan:   Principal Problem:   Closed left hip fracture [facility]) Active Problems:   Essential hypertension   Fall at home, initial encounter   Dementia [facility])     Closed left hip fracture: s/p left intramedullary nail femoral on 12/16 as per ortho surg. Tylenol, percocet prn. PT/OT recs SNF  Leukocytosis: resolved   Normocytic anemia:  w/ likely component of acute blood loss anemia secondary to above surg. S/p 1 unit of pRBCs transfused. H&H are labile    HTN: continue on home dose of amlodipine. IV hydralazine    Fall: PT/OT recs SNF   Dementia: continue on home dose of nameda, aricept   Thrombocytopenia: resolved     DVT prophylaxis: SCDs Code Status: full  Family Communication:  Disposition Plan: likely d/c to SNF  Level of care: Med-Surg  Status is: Inpatient  Remains inpatient appropriate because: needs SNF placement     Consultants:  Ortho surg   Procedures:   Antimicrobials:   Subjective: Pt is still pleasantly confused   Objective: Vitals:   11/09/21 1325 11/09/21 1652 11/09/21 2034 11/10/21 0347  BP: (!) 138/59 139/88 (!) 138/50 (!) 122/45  Pulse: (!) 107 (!) 109 92 73  Resp: 16 18 18 16   Temp: 98.4 F (36.9 C) 98.3 F (36.8 C) (!) 100.8 F (38.2 C) 98.1 F (36.7 C)  TempSrc: Oral  Oral Oral  SpO2: 98% 96% 100% 99%  Weight:      Height:        Intake/Output Summary (Last 24 hours) at 11/10/2021 0752 Last data filed at 11/09/2021 1900 Gross per 24 hour  Intake 500 ml  Output 400 ml  Net 100 ml   Filed Weights   11/06/21 1058  Weight: 72.5 kg    Examination:  General exam: Appears comfortable    Respiratory system: clear breath sounds b/l  Cardiovascular system: S1/S2+. No rubs or clicks  Gastrointestinal system: Abd is soft, NT, ND & hypoactive bowel sounds  Central nervous system: Alert and awake. Moves all  extremities   Psychiatry: judgement and insight appears poor. Flat mood and affect    Data Reviewed: I have personally reviewed following labs and imaging studies  CBC: Recent Labs  Lab 11/06/21 1102 11/07/21 0810 11/08/21 0547 11/09/21 0454 11/09/21 1847 11/10/21 0332  WBC 8.3 11.0* 11.0* 9.0  --  9.6  HGB 12.0 10.1* 7.8* 6.5* 8.8* 7.8*  HCT 35.8* 30.2* 23.1* 19.2* 25.0* 22.0*  MCV 93.7 93.2 92.4 92.3  --  88.0  PLT 207 183 163 135*  --  156   Basic Metabolic Panel: Recent Labs  Lab 11/06/21 1202 11/07/21 0810 11/08/21 0547 11/09/21 0454 11/10/21 0332  NA 139 140 138 138 139  K 3.7 4.4 4.2 4.1 3.6  CL 107 109 107 107 106  CO2 24 27 25 26 28   GLUCOSE 147* 126* 117* 106* 118*  BUN 18 20 18 15 12   CREATININE 0.65 0.70 0.67 0.62 0.51  CALCIUM 9.4 9.4 8.5* 8.5* 8.5*   GFR: Estimated Creatinine Clearance: 57.6 mL/min (by C-G formula based on SCr of 0.51 mg/dL). Liver Function Tests: No results for input(s): AST, ALT, ALKPHOS, BILITOT, PROT, ALBUMIN in the last 168 hours. No results for input(s): LIPASE, AMYLASE in the last 168 hours. No results for input(s): AMMONIA in the last 168 hours. Coagulation Profile: Recent Labs  Lab 11/06/21 1115  INR 1.0   Cardiac Enzymes: No results for input(s): CKTOTAL, CKMB, CKMBINDEX, TROPONINI in the last 168 hours. BNP (last 3 results) No results for input(s): PROBNP in the last 8760 hours. HbA1C: No results for input(s): HGBA1C in the last 72 hours. CBG: No results for input(s): GLUCAP in the last 168 hours. Lipid Profile: No results for input(s): CHOL, HDL, LDLCALC, TRIG, CHOLHDL, LDLDIRECT in the last 72 hours. Thyroid Function Tests: No results for input(s): TSH, T4TOTAL, FREET4, T3FREE, THYROIDAB in the last 72 hours. Anemia Panel: No results for input(s): VITAMINB12, FOLATE, FERRITIN, TIBC, IRON, RETICCTPCT in the last 72 hours. Sepsis Labs: No results for input(s): PROCALCITON, LATICACIDVEN in the last 168  hours.  Recent Results (from the past 240 hour(s))  Resp Panel by RT-PCR (Flu A&B, Covid) Nasopharyngeal Swab     Status: None   Collection Time: 11/06/21  1:38 PM   Specimen: Nasopharyngeal Swab; Nasopharyngeal(NP) swabs in vial transport medium  Result Value Ref Range Status   SARS Coronavirus 2 by RT PCR NEGATIVE NEGATIVE Final    Comment: (NOTE) SARS-CoV-2 target nucleic acids are NOT DETECTED.  The SARS-CoV-2 RNA is generally detectable in upper respiratory specimens during the acute phase of infection. The lowest concentration of SARS-CoV-2 viral copies this assay can detect is 138 copies/mL. A negative result does not preclude SARS-Cov-2 infection and should not be used as the sole basis for treatment or other patient management decisions. A negative result may occur with  improper specimen collection/handling, submission of specimen other than nasopharyngeal swab, presence of viral mutation(s) within the areas targeted by this assay, and inadequate number of viral copies(<138 copies/mL). A negative result must be combined with clinical observations, patient history, and epidemiological information. The expected result is Negative.  Fact Sheet for Patients:  BloggerCourse.com  Fact Sheet for Healthcare Providers:  SeriousBroker.it  This test is no t yet approved or cleared by the [place] FDA and  has been authorized for detection and/or diagnosis of SARS-CoV-2 by FDA under an Emergency Use Authorization (EUA). This EUA will remain  in effect (meaning this test can be used) for the duration of the COVID-19 declaration under Section 564(b)(1) of the Act, 21 U.S.C.section 360bbb-3(b)(1), unless the authorization is terminated  or revoked sooner.       Influenza A by PCR NEGATIVE NEGATIVE Final   Influenza B by PCR NEGATIVE NEGATIVE Final    Comment: (NOTE) The Xpert Xpress SARS-CoV-2/FLU/RSV plus assay is intended  as an aid in the diagnosis of influenza from Nasopharyngeal swab specimens and should not be used as a sole basis for treatment. Nasal washings and aspirates are unacceptable for Xpert Xpress SARS-CoV-2/FLU/RSV testing.  Fact Sheet for Patients: BloggerCourse.com  Fact Sheet for Healthcare Providers: SeriousBroker.it  This test is not yet approved or cleared by the [place] FDA and has been authorized for detection and/or diagnosis of SARS-CoV-2 by FDA under an Emergency Use Authorization (EUA). This EUA will remain in effect (meaning this test can be used) for the duration of the COVID-19 declaration under Section 564(b)(1) of the Act, 21 U.S.C. section 360bbb-3(b)(1), unless the authorization is terminated or revoked.  Performed at [facility], [address], [place], [place] [address]          Radiology Studies: No results found.      Scheduled Meds:  amLODipine  5 mg Oral Daily   cholecalciferol  1,000 Units Oral Daily   docusate sodium  100 mg Oral BID   donepezil  5 mg Oral Daily   feeding supplement  237 mL Oral BID BM   ferrous sulfate  325 mg Oral Q breakfast   memantine  5 mg Oral BID   multivitamin with minerals  1 tablet Oral Daily   senna-docusate  1 tablet Oral BID   Continuous Infusions:     LOS: 4 days    Time spent: 15 mins     Charise Killian, MD [facility] Pager [phone]  If 7PM-7AM, please contact night-coverage 11/10/2021, 7:52 AM

## 2021-11-10 NOTE — Progress Notes (Signed)
OT Cancellation Note  Patient Details Name: Cheyenne Parker MRN: [id number] DOB: [birth year]   Cancelled Treatment:    Reason Eval/Treat Not Completed: Medical issues which prohibited therapy. Consult received, chart reviewed. Spoke with PTA who just worked with pt. Per PTA, noted a pop sound from the L hip upon standing associated with pain. Medical team notified. MD has ordered xrays. Will hold OT evaluation at this time and re-attempt at later date/time once imaging has been completed and reviewed and plan of care has been updated accordingly.   Arman Filter., MPH, MS, OTR/L ascom [phone] 11/10/21, 2:15 PM

## 2021-11-10 NOTE — TOC Progression Note (Signed)
Transition of Care [facility]) - Progression Note    Patient Details  Name: Cheyenne Parker MRN: [id number] Date of Birth: [birth year]  Transition of [facility]) CM/SW Contact  Chapman Fitch, RN Phone Number: 11/10/2021, 9:50 AM  Clinical Narrative:     No bed offers Husband agreeable to extend bed search, however he would like to stay in [place] if possible.   Search extended Reached out to [facility], [facility], Estate manager/land agent to request review   Expected Discharge Plan: Skilled Nursing Facility Barriers to Discharge: Continued Medical Work up  Expected Discharge Plan and Services Expected Discharge Plan: Skilled Nursing Facility     Post Acute Care Choice: Skilled Nursing Facility Living arrangements for the past 2 months: Single Family Home                                       Social Determinants of Health (SDOH) Interventions    Readmission Risk Interventions No flowsheet data found.

## 2021-11-10 NOTE — TOC Progression Note (Addendum)
Transition of Care [facility]) - Progression Note    Patient Details  Name: Cheyenne Parker MRN: [id number] Date of Birth: 25-Jan-1946  [facility]) CM/SW Contact  Chapman Fitch, RN Phone Number: 11/10/2021, 2:52 PM  Clinical Narrative:    Presented bed offers to husband.  Accepted bed at [facility].  Accepted bed in [facility], and notified Tammy at [facility].    Reached out to MD to determine anticipated DC date in order to start auth  Update: Per MD anticipated DC tomorrow. Holly with TOC to start auth   Expected Discharge Plan: Skilled Nursing Facility Barriers to Discharge: Continued Medical Work up  Expected Discharge Plan and Services Expected Discharge Plan: Skilled Nursing Facility     Post Acute Care Choice: Skilled Nursing Facility Living arrangements for the past 2 months: Single Family Home                                       Social Determinants of Health (SDOH) Interventions    Readmission Risk Interventions No flowsheet data found.

## 2021-11-11 LAB — BASIC METABOLIC PANEL
Anion gap: 7 (ref 5–15)
BUN: 12 mg/dL (ref 8–23)
CO2: 29 mmol/L (ref 22–32)
Calcium: 8.8 mg/dL — ABNORMAL LOW (ref 8.9–10.3)
Chloride: 104 mmol/L (ref 98–111)
Creatinine, Ser: 0.51 mg/dL (ref 0.44–1.00)
GFR, Estimated: >60 mL/min (ref >60)
Glucose, Bld: 115 mg/dL — ABNORMAL HIGH (ref 70–99)
Potassium: 3.8 mmol/L (ref 3.5–5.1)
Sodium: 140 mmol/L (ref 135–145)

## 2021-11-11 LAB — CBC
HCT: 24.3 % — ABNORMAL LOW (ref 36.0–46.0)
Hemoglobin: 8.4 g/dL — ABNORMAL LOW (ref 12.0–15.0)
MCH: 31.3 pg (ref 26.0–34.0)
MCHC: 34.6 g/dL (ref 30.0–36.0)
MCV: 90.7 fL (ref 80.0–100.0)
Platelets: 190 10*3/uL (ref 150–400)
RBC: 2.68 MIL/uL — ABNORMAL LOW (ref 3.87–5.11)
RDW: 13.7 % (ref 11.5–15.5)
WBC: 8 10*3/uL (ref 4.0–10.5)
nRBC: 0 % (ref 0.0–0.2)

## 2021-11-11 LAB — TYPE AND SCREEN
ABO/RH(D): A POS
Antibody Screen: NEGATIVE
Unit division: 0

## 2021-11-11 LAB — BPAM RBC
Blood Product Expiration Date: 202212232359
ISSUE DATE / TIME: 202212181103
Unit Type and Rh: 600

## 2021-11-11 MED ORDER — ACETAMINOPHEN 325 MG PO TABS: 650.0000 mg | ORAL_TABLET | Freq: Four times a day (QID) | ORAL | Status: AC | PRN

## 2021-11-11 MED ORDER — LACTULOSE 10 GM/15ML PO SOLN
20.0000 g | Freq: Two times a day (BID) | ORAL | Status: DC
  Administered 2021-11-11: 15:00:00 20 g via ORAL
  Filled 2021-11-11: qty 30

## 2021-11-11 NOTE — Progress Notes (Signed)
Physical Therapy Treatment Patient Details Name: Cheyenne Parker MRN: [id number] DOB: [birth year] Today's Date: 11/11/2021   History of Present Illness Pt is a 75 yo female s/p L hip IM nailing, WBAT. PMH of HTN, dementia, R hip fx.    PT Comments    Pt seen this am with OT for safe transfers and mobility due to pt requiring MaxAx2 for transfers with RW. Left Hip x-ray negative. Pt continues to c/o 10/10 pain during weight bearing despite being premedicated and remains pleasantly disoriented to time, place, current situation. Pt recognizes husband yet can't recall his name.  Pt will benefit from SNF placement upon d/c with continued PT.  Pt left in bed with call bell, bed lowered, alarm on, husband at bedside.   Recommendations for follow up therapy are one component of a multi-disciplinary discharge planning process, led by the attending physician.  Recommendations may be updated based on patient status, additional functional criteria and insurance authorization.  Follow Up Recommendations  Skilled nursing-short term rehab (<3 hours/day)     Assistance Recommended at Discharge Frequent or constant Supervision/Assistance  Equipment Recommendations  Rolling walker (2 wheels)    Recommendations for Other Services       Precautions / Restrictions Precautions Precautions: Fall Restrictions Weight Bearing Restrictions: Yes LLE Weight Bearing: Weight bearing as tolerated     Mobility  Bed Mobility Overal bed mobility: Needs Assistance Bed Mobility: Supine to Sit Rolling: Max assist   Supine to sit: Max assist;HOB elevated Sit to supine: Max assist;+2 for physical assistance   General bed mobility comments:  (dependent to scoot up in bed)    Transfers Overall transfer level: Needs assistance Equipment used: Rolling walker (2 wheels) Transfers: Sit to/from Stand;Bed to chair/wheelchair/BSC Sit to Stand: Max assist;+2 physical assistance     Step pivot transfers: Max assist;+2  physical assistance     General transfer comment: minimal weight placed through L LE due to c/o pain, poor understanding of use of RW    Ambulation/Gait               General Gait Details: unable at this time   Stairs             Wheelchair Mobility    Modified Rankin (Stroke Patients Only)       Balance Overall balance assessment: Needs assistance;History of Falls Sitting-balance support: Single extremity supported;No upper extremity supported;Feet supported Sitting balance-Leahy Scale: Fair   Postural control: Posterior lean Standing balance support: Bilateral upper extremity supported;Reliant on assistive device for balance Standing balance-Leahy Scale: Poor Standing balance comment: Requires assist to attain midline, high fall risk                            Cognition Arousal/Alertness: Awake/alert Behavior During Therapy: WFL for tasks assessed/performed Overall Cognitive Status: History of cognitive impairments - at baseline                                 General Comments: oriented to self, generally to situation, follows simple step commands with extra time and cues        Exercises General Exercises - Lower Extremity Ankle Circles/Pumps: AROM;Strengthening;Both;10 reps;Supine Heel Slides: AAROM;Left;10 reps Hip ABduction/ADduction: AAROM;Left;10 reps Other Exercises Other Exercises: bed mobility, ADL transfer training, toileting, simulated LB dressing    General Comments        Pertinent Vitals/Pain Pain Assessment:  0-10 Pain Score: 10-Worst pain ever Breathing: normal Negative Vocalization: none Facial Expression: facial grimacing Body Language: tense, distressed pacing, fidgeting Consolability: distracted or reassured by voice/touch PAINAD Score: 4 Pain Location: left leg Pain Descriptors / Indicators: Grimacing;Guarding;Sharp;Shooting;Aching Pain Intervention(s): Monitored during session;Premedicated  before session    Home Living Family/patient expects to be discharged to:: Private residence Living Arrangements: Spouse/significant other Available Help at Discharge: Family Type of Home: House Home Access: Stairs to enter Entrance Stairs-Rails: Right Entrance Stairs-Number of Steps: 2   Home Layout: One level Home Equipment: Grab bars - tub/shower;Grab bars - toilet;BSC/3in1;Shower seat - built in Additional Comments: PLOF gathered from chart review, though needs to be verified with family/pt as able    Prior Function            PT Goals (current goals can now be found in the care plan section) Acute Rehab PT Goals Patient Stated Goal: none stated    Frequency    7X/week      PT Plan Current plan remains appropriate    Co-evaluation   Reason for Co-Treatment: Complexity of the patient's impairments (multi-system involvement);For patient/therapist safety PT goals addressed during session: Mobility/safety with mobility;Proper use of DME OT goals addressed during session: Proper use of Adaptive equipment and DME;ADL's and self-care      AM-PAC PT "6 Clicks" Mobility   Outcome Measure  Help needed turning from your back to your side while in a flat bed without using bedrails?: A Lot Help needed moving from lying on your back to sitting on the side of a flat bed without using bedrails?: A Lot Help needed moving to and from a bed to a chair (including a wheelchair)?: Total Help needed standing up from a chair using your arms (e.g., wheelchair or bedside chair)?: Total Help needed to walk in hospital room?: Total Help needed climbing 3-5 steps with a railing? : Total 6 Click Score: 8    End of Session Equipment Utilized During Treatment: Gait belt Activity Tolerance: Patient limited by pain Patient left: in bed;with call bell/phone within reach;with bed alarm set;with family/visitor present;with SCD's reapplied Nurse Communication: Mobility status PT Visit  Diagnosis: Other abnormalities of gait and mobility (R26.89);Difficulty in walking, not elsewhere classified (R26.2);Muscle weakness (generalized) (M62.81);Pain Pain - Right/Left: Left Pain - part of body: Hip     Time: 1025-1040 PT Time Calculation (min) (ACUTE ONLY): 15 min  Charges:  $Therapeutic Activity: 8-22 mins                    Zadie Cleverly, PTA    Jannet Askew 11/11/2021, 1:33 PM

## 2021-11-11 NOTE — TOC Transition Note (Signed)
Transition of Care [facility]) - CM/SW Discharge Note   Patient Details  Name: Cheyenne Parker MRN: [id number] Date of Birth: Feb 15, 1946  [facility] of [facility] CM/SW Contact:  Chapman Fitch, RN Phone Number: 11/11/2021, 12:51 PM   Clinical Narrative:    Patient will DC OF:[facility] Anticipated DC date:11/11/21  Family notified:Message left for husband Transport by: EMS  Per MD patient ready for DC to . RN, patient, patient's family, and facility notified of DC. Discharge Summary sent to facility. RN given number for report. DC packet on chart. Ambulance transport requested for patient.  TOC signing off.  Bevelyn Ngo [facility] [phone]      Barriers to Discharge: Continued Medical Work up   Patient Goals and CMS Choice        Discharge Placement                       Discharge Plan and Services     Post Acute Care Choice: Skilled Nursing Facility                               Social Determinants of Health (SDOH) Interventions     Readmission Risk Interventions No flowsheet data found.

## 2021-11-11 NOTE — TOC Progression Note (Signed)
Transition of Care [facility]) - Progression Note    Patient Details  Name: Cheyenne Parker MRN: [id number] Date of Birth: 1946/03/10  [facility]) CM/SW Contact  Chapman Fitch, RN Phone Number: 11/11/2021, 9:00 AM  Clinical Narrative:     Berkley Harvey obtained.  Auth number [id number] valid 12/20-12/22.  MD notified   Expected Discharge Plan: Skilled Nursing Facility Barriers to Discharge: Continued Medical Work up  Expected Discharge Plan and Services Expected Discharge Plan: Skilled Nursing Facility     Post Acute Care Choice: Skilled Nursing Facility Living arrangements for the past 2 months: Single Family Home                                       Social Determinants of Health (SDOH) Interventions    Readmission Risk Interventions No flowsheet data found.

## 2021-11-11 NOTE — Progress Notes (Signed)
Xray of femur shows stable position of fracture. Continue WBAT

## 2021-11-11 NOTE — Evaluation (Signed)
Occupational Therapy Evaluation Patient Details Name: Cheyenne Parker MRN: [id number] DOB: [birth year] Today's Date: 11/11/2021   History of Present Illness Pt is a 75 yo female s/p L hip IM nailing, WBAT. PMH of HTN, dementia, R hip fx.   Clinical Impression   Pt was seen for OT evaluation and co-tx with PTA to address ADL transfers this date. Pt received in bed receiving medication from RN and agreeable to OT. Pt oriented to self and generally to situation, able to share her L leg is hurt. Pt demonstrates impairments as described below (See OT problem list) which functionally limit her ability to perform ADL/self-care tasks. Pt currently requires MAX A for sup>sit EOB and +2 for return to bed. MAX A +2 for STS and step pivot transfers with RW to [facility] requiring MAX VC for balance, feet and hands placement, and sequencing of RW with significant posterior lean. Pt would benefit from skilled OT services to address noted impairments and functional limitations (see below for any additional details) in order to maximize safety and independence while minimizing falls risk and caregiver burden. Upon hospital discharge, recommend STR to maximize pt safety and return to PLOF.    Recommendations for follow up therapy are one component of a multi-disciplinary discharge planning process, led by the attending physician.  Recommendations may be updated based on patient status, additional functional criteria and insurance authorization.   Follow Up Recommendations  Skilled nursing-short term rehab (<3 hours/day)    Assistance Recommended at Discharge Frequent or constant Supervision/Assistance  Functional Status Assessment  Patient has had a recent decline in their functional status and demonstrates the ability to make significant improvements in function in a reasonable and predictable amount of time.  Equipment Recommendations  BSC/3in1    Recommendations for Other Services       Precautions / Restrictions  Precautions Precautions: Fall Restrictions Weight Bearing Restrictions: Yes LLE Weight Bearing: Weight bearing as tolerated      Mobility Bed Mobility Overal bed mobility: Needs Assistance Bed Mobility: Supine to Sit     Supine to sit: Max assist;HOB elevated Sit to supine: Max assist;+2 for physical assistance        Transfers Overall transfer level: Needs assistance Equipment used: Rolling walker (2 wheels) Transfers: Sit to/from Stand;Bed to chair/wheelchair/BSC Sit to Stand: Max assist;+2 physical assistance     Step pivot transfers: Max assist;+2 physical assistance            Balance Overall balance assessment: Needs assistance;History of Falls Sitting-balance support: Single extremity supported;No upper extremity supported;Feet supported Sitting balance-Leahy Scale: Fair   Postural control: Posterior lean Standing balance support: Bilateral upper extremity supported;Reliant on assistive device for balance Standing balance-Leahy Scale: Poor Standing balance comment: very poor, difficulty pulling feet underneath her to correct for posterior lean                           ADL either performed or assessed with clinical judgement   ADL Overall ADL's : Needs assistance/impaired                                       General ADL Comments: Pt currently requires MAX A for LB ADL from seated position, MAX A for pericare in standing, and supv/set up for seated EOB UB bathing/grooming.     Vision  Perception     Praxis      Pertinent Vitals/Pain Pain Assessment: 0-10 Pain Location: left leg Pain Descriptors / Indicators: Grimacing;Guarding;Sharp;Shooting;Aching Pain Intervention(s): Limited activity within patient's tolerance;Monitored during session;Premedicated before session;Repositioned;Ice applied     Hand Dominance     Extremity/Trunk Assessment Upper Extremity Assessment Upper Extremity Assessment: Generalized  weakness   Lower Extremity Assessment Lower Extremity Assessment: Generalized weakness;LLE deficits/detail LLE Deficits / Details: post-op strength/ROM/pain deficits LLE: Unable to fully assess due to pain       Communication     Cognition Arousal/Alertness: Awake/alert Behavior During Therapy: WFL for tasks assessed/performed Overall Cognitive Status: History of cognitive impairments - at baseline                                 General Comments: oriented to self, generally to situation, follows simple step commands with extra time and cues     General Comments       Exercises Other Exercises Other Exercises: bed mobility, ADL transfer training, toileting, simulated LB dressing   Shoulder Instructions      Home Living Family/patient expects to be discharged to:: Private residence Living Arrangements: Spouse/significant other Available Help at Discharge: Family Type of Home: House Home Access: Stairs to enter Secretary/administrator of Steps: 2 Entrance Stairs-Rails: Right Home Layout: One level     Bathroom Shower/Tub: Tub/shower unit;Walk-in shower   Bathroom Toilet: Standard     Home Equipment: Grab bars - tub/shower;Grab bars - toilet;BSC/3in1;Shower seat - built in   Additional Comments: PLOF gathered from chart review, though needs to be verified with family/pt as able      Prior Functioning/Environment Prior Level of Function : Patient poor historian/Family not available                        OT Problem List: Decreased strength;Decreased range of motion;Pain;Decreased cognition;Decreased activity tolerance;Impaired balance (sitting and/or standing);Decreased knowledge of use of DME or AE      OT Treatment/Interventions: Self-care/ADL training;Therapeutic exercise;Therapeutic activities;DME and/or AE instruction;Patient/family education;Balance training    OT Goals(Current goals can be found in the care plan section) Acute Rehab  OT Goals Patient Stated Goal: get better OT Goal Formulation: With patient/family Time For Goal Achievement: 11/25/21 Potential to Achieve Goals: Good ADL Goals Pt Will Perform Lower Body Dressing: with mod assist;sit to/from stand Pt Will Transfer to Toilet: with mod assist;bedside commode;stand pivot transfer (RW) Pt Will Perform Toileting - Clothing Manipulation and hygiene: with min assist;sit to/from stand Additional ADL Goal #1: Pt will perform bed mobility wiht MIN A + PRN VC in anticipation of seated EOB ADL.  OT Frequency: Min 2X/week   Barriers to D/C: Decreased caregiver support          Co-evaluation PT/OT/SLP Co-Evaluation/Treatment: Yes Reason for Co-Treatment: For patient/therapist safety;To address functional/ADL transfers PT goals addressed during session: Mobility/safety with mobility;Balance;Proper use of DME OT goals addressed during session: Proper use of Adaptive equipment and DME;ADL's and self-care      AM-PAC OT "6 Clicks" Daily Activity     Outcome Measure Help from another person eating meals?: A Little Help from another person taking care of personal grooming?: A Little Help from another person toileting, which includes using toliet, bedpan, or urinal?: Total Help from another person bathing (including washing, rinsing, drying)?: A Lot Help from another person to put on and taking off regular upper body clothing?: A  Little Help from another person to put on and taking off regular lower body clothing?: A Lot 6 Click Score: 14   End of Session Equipment Utilized During Treatment: Gait belt;Rolling walker (2 wheels) Nurse Communication: Mobility status;Other (comment) (needs bath)  Activity Tolerance: Patient tolerated treatment well Patient left: in bed;with call bell/phone within reach;with bed alarm set;with family/visitor present;Other (comment) (PTA)  OT Visit Diagnosis: Other abnormalities of gait and mobility (R26.89);Muscle weakness  (generalized) (M62.81);History of falling (Z91.81);Pain Pain - Right/Left: Left Pain - part of body: Hip;Leg                Time: 3570-1779 OT Time Calculation (min): 26 min Charges:  OT General Charges $OT Visit: 1 Visit OT Evaluation $OT Eval Moderate Complexity: 1 Mod OT Treatments $Self Care/Home Management : 8-22 mins  Arman Filter., MPH, MS, OTR/L ascom [phone] 11/11/21, 10:55 AM

## 2021-11-11 NOTE — Discharge Summary (Signed)
Physician Discharge Summary  Cheyenne Parker ZOX:[id number] DOB: 09-19-1946 DOA: 11/06/2021  PCP: Corky Downs, MD  Admit date: 11/06/2021 Discharge date: 11/11/2021  Admitted From: home  Disposition:  SNF  Recommendations for Outpatient Follow-up:  Follow up with PCP in 1-2 weeks F/u w/ ortho surg, Dr. Rosita Kea, in 2 weeks   Home Health:  Equipment/Devices:  Discharge Condition: stable  CODE STATUS: full Diet recommendation: Heart Healthy   Brief/Interim Summary: HPI was taken from Dr. Clyde Lundborg: Cheyenne Parker is a 75 y.o. female with medical history significant of hypertension, hyperlipidemia, anemia, osteoporosis, vitamin D deficiency, dementia, who presents with fall, left hip pain.   Patient states that she fell accidentally when she slipped in kitchen this morning.  No loss of consciousness.  She developed pain in the left hip, which is constant, sharp, severe, nonradiating.  No leg numbness. She states that she hit her head. No neck injury.  Patient denies chest pain, cough, shortness breath.  No fever or chills.  No nausea vomiting, diarrhea or abdominal pain.  No symptoms of UTI.   ED Course: pt was found to have WBC 8.3, INR 1.0, troponin level 7, pending COVID-19 PCR, GFR > 60, temperature normal, blood pressure 160/68, heart rate 73, RR 16, oxygen saturation 99% on room air.  CT of head is negative for acute intracranial abnormalities.  CT of C-spine is negative for acute injury, but showed degenerative disc disease.  CT of left hip showed displaced, angulated and shortened largely subtrochanteric fracture. Pt is admitted to MedSurg bed as inpatient.  Dr. Rosita Kea of Ortho is consulted.  Hospital course from Dr. Mayford Knife 12/16-12/20/22: Pt was found to have closed left hip fracture secondary to fall at home. Pt is s/p left intramedullary nail femoral on 12/16 as per ortho surg. PT/OT evaluated the pt and recommended SNF. Of note, pt did receive 1 unit of pRBCs secondary to acute blood loss  anemia from recent surg. For more information, please see previous progress/consult notes.   Discharge Diagnoses:  Principal Problem:   Closed left hip fracture [facility]) Active Problems:   Essential hypertension   Fall at home, initial encounter   Dementia [facility])  Closed left hip fracture: s/p left intramedullary nail femoral on 12/16 as per ortho surg. Tylenol, percocet prn. PT/OT recs SNF   Leukocytosis: resolved    Normocytic anemia:  w/ likely component of acute blood loss anemia secondary to above surg. S/p 1 unit of pRBCs transfused. H&H are labile    HTN: continue on home dose of amlodipine. IV hydralazine    Fall: PT/OT recs SNF    Dementia: continue on home dose of nameda, aricept    Thrombocytopenia: resolved   Discharge Instructions  Discharge Instructions     Diet - low sodium heart healthy   Complete by: As directed    Discharge instructions   Complete by: As directed    F/u w/ ortho surg, Dr. Rosita Kea, in 2 weeks. F/u w/ PCP in 1-2 weeks.   Increase activity slowly   Complete by: As directed    No wound care   Complete by: As directed       Allergies as of 11/11/2021   No Known Allergies      Medication List     STOP taking these medications    enoxaparin 40 MG/0.4ML injection Commonly known as: LOVENOX Replaced by: enoxaparin 40 MG/0.4ML injection       TAKE these medications    acetaminophen 325 MG tablet Commonly  known as: TYLENOL Take 2 tablets (650 mg total) by mouth every 6 (six) hours as needed for mild pain, fever or headache.   amLODipine 5 MG tablet Commonly known as: NORVASC Take 1 tablet (5 mg total) by mouth daily.   cholecalciferol 25 MCG (1000 UNIT) tablet Commonly known as: VITAMIN D Take 1 tablet (1,000 Units total) by mouth daily.   donepezil 5 MG tablet Commonly known as: ARICEPT Take 5 mg by mouth daily.   enoxaparin 40 MG/0.4ML injection Commonly known as: LOVENOX Inject 0.4 mLs (40 mg total) into the skin  daily. Replaces: enoxaparin 40 MG/0.4ML injection   memantine 5 MG tablet Commonly known as: NAMENDA Take 5 mg by mouth 2 (two) times daily.   multivitamin tablet Take 1 tablet by mouth daily.   oxyCODONE-acetaminophen 5-325 MG tablet Commonly known as: PERCOCET/ROXICET Take 1 tablet by mouth every 6 (six) hours as needed for moderate pain.   senna-docusate 8.6-50 MG tablet Commonly known as: Senokot-S Take 1 tablet by mouth 2 (two) times daily.        Contact information for follow-up providers     Evon Slack, PA-C Follow up in 14 day(s).   Specialties: Orthopedic Surgery, Emergency Medicine Why: Staple removal and x-rays. Contact information: [address] [place] [address] [phone]              Contact information for after-discharge care     Destination     [facility] Preferred SNF .   Service: Skilled Nursing Contact information: [address] [place] [address] [phone]                    No Known Allergies  Consultations: Ortho surg    Procedures/Studies: CT HEAD WO CONTRAST ( )  Result Date: 11/06/2021 CLINICAL DATA:  75 year old female with history of head trauma after a fall. EXAM: CT HEAD WITHOUT CONTRAST CT CERVICAL SPINE WITHOUT CONTRAST TECHNIQUE: Multidetector CT imaging of the head and cervical spine was performed following the standard protocol without intravenous contrast. Multiplanar CT image reconstructions of the cervical spine were also generated. COMPARISON:  No priors. FINDINGS: CT HEAD FINDINGS Brain: Moderate cerebral atrophy. Patchy and confluent areas of decreased attenuation are noted throughout the deep and periventricular white matter of the cerebral hemispheres bilaterally, compatible with chronic microvascular ischemic disease. Well-defined area of low attenuation in the left basal ganglia, compatible with a small lacunar infarct. Physiologic calcification  also noted in the left basal ganglia. No evidence of acute infarction, hemorrhage, hydrocephalus, extra-axial collection or mass lesion/mass effect. Vascular: No hyperdense vessel or unexpected calcification. Skull: Normal. Negative for fracture or focal lesion. Sinuses/Orbits: No acute finding. Other: None. CT CERVICAL SPINE FINDINGS Alignment: Normal. Skull base and vertebrae: No acute fracture. No primary bone lesion or focal pathologic process. Soft tissues and spinal canal: No prevertebral fluid or swelling. No visible canal hematoma. Disc levels: Mild multilevel degenerative disc disease most evident at C6-C7. Mild multilevel facet arthropathy. Upper chest: Unremarkable. Other: None. IMPRESSION: 1. No evidence of significant acute traumatic injury to the skull, brain or cervical spine. 2. Moderate cerebral atrophy with extensive chronic microvascular ischemic changes in the cerebral white matter and old left basal ganglia lacunar infarct, as above. 3. Multilevel degenerative disc disease and cervical spondylosis, as above. Electronically Signed   By: Trudie Reed M.D.   On: 11/06/2021 12:14   CT Cervical Spine Wo Contrast  Result Date: 11/06/2021 CLINICAL DATA:  75 year old female  with history of head trauma after a fall. EXAM: CT HEAD WITHOUT CONTRAST CT CERVICAL SPINE WITHOUT CONTRAST TECHNIQUE: Multidetector CT imaging of the head and cervical spine was performed following the standard protocol without intravenous contrast. Multiplanar CT image reconstructions of the cervical spine were also generated. COMPARISON:  No priors. FINDINGS: CT HEAD FINDINGS Brain: Moderate cerebral atrophy. Patchy and confluent areas of decreased attenuation are noted throughout the deep and periventricular white matter of the cerebral hemispheres bilaterally, compatible with chronic microvascular ischemic disease. Well-defined area of low attenuation in the left basal ganglia, compatible with a small lacunar infarct.  Physiologic calcification also noted in the left basal ganglia. No evidence of acute infarction, hemorrhage, hydrocephalus, extra-axial collection or mass lesion/mass effect. Vascular: No hyperdense vessel or unexpected calcification. Skull: Normal. Negative for fracture or focal lesion. Sinuses/Orbits: No acute finding. Other: None. CT CERVICAL SPINE FINDINGS Alignment: Normal. Skull base and vertebrae: No acute fracture. No primary bone lesion or focal pathologic process. Soft tissues and spinal canal: No prevertebral fluid or swelling. No visible canal hematoma. Disc levels: Mild multilevel degenerative disc disease most evident at C6-C7. Mild multilevel facet arthropathy. Upper chest: Unremarkable. Other: None. IMPRESSION: 1. No evidence of significant acute traumatic injury to the skull, brain or cervical spine. 2. Moderate cerebral atrophy with extensive chronic microvascular ischemic changes in the cerebral white matter and old left basal ganglia lacunar infarct, as above. 3. Multilevel degenerative disc disease and cervical spondylosis, as above. Electronically Signed   By: Trudie Reed M.D.   On: 11/06/2021 12:14   CT Hip Left Wo Contrast  Result Date: 11/06/2021 CLINICAL DATA:  Evaluate left hip fracture. EXAM: CT OF THE LEFT HIP WITHOUT CONTRAST TECHNIQUE: Multidetector CT imaging of the left hip was performed according to the standard protocol. Multiplanar CT image reconstructions were also generated. COMPARISON:  None. FINDINGS: There is a displaced and angulated subtrochanteric fracture of the left hip. There is also fairly marked shortening. The fracture does involve the lesser trochanter which is able stand displaced up near the upper aspect of the femoral neck. The femoral head is normally located. Minimal degenerative changes. The visualized left hemipelvic bony structures are intact. No acetabular or pubic rami fractures. Moderate degenerative changes noted at the pubic symphysis. There  is associated hematoma involving the surrounding hip muscles. No significant intrapelvic abnormalities are identified. IMPRESSION: 1. Displaced, angulated and shortened largely subtrochanteric fracture as detailed above. 2. No pelvic fractures. Electronically Signed   By: Rudie Meyer M.D.   On: 11/06/2021 12:37   DG C-Arm 1-60 Min-No Report  Result Date: 11/07/2021 Fluoroscopy was utilized by the requesting physician.  No radiographic interpretation.   DG Hip [place] or [place] Pelvis 1 View Left  Result Date: 11/06/2021 CLINICAL DATA:  LEFT hip fracture preparation for surgery. EXAM: DG HIP (WITH OR WITHOUT PELVIS) 1V PORT LEFT COMPARISON:  CT of November 06, 2021. FINDINGS: Displaced, angulated and foreshortened fracture of the subtrochanteric LEFT femur extending into the lesser trochanter is similar to recent CT imaging. No substantial change on AP view. IMPRESSION: Left femoral fracture as above. No substantial change on AP view. Electronically Signed   By: Donzetta Kohut M.D.   On: 11/06/2021 14:17   DG FEMUR MIN 2 VIEWS LEFT  Result Date: 11/10/2021 CLINICAL DATA:  Postoperative pain. EXAM: LEFT FEMUR 2 VIEWS COMPARISON:  November 07, 2021.  November 06, 2021. FINDINGS: Status post intramedullary rod fixation of proximal left femoral shaft fracture. Improved alignment of fracture  components is noted compared to prior exam in November 06, 2021. Expected postoperative changes are seen in the surrounding soft tissues. IMPRESSION: Status post intramedullary rod fixation of proximal left femoral shaft fracture. Electronically Signed   By: Lupita Raider M.D.   On: 11/10/2021 15:38   DG FEMUR MIN 2 VIEWS LEFT  Result Date: 11/07/2021 CLINICAL DATA:  IM nail EXAM: LEFT FEMUR 2 VIEWS COMPARISON:  11/06/2021 FINDINGS: Four low resolution intraoperative spot views of the left femur. Total fluoroscopy time was 3 minutes 12 seconds. The images demonstrate intramedullary rod and distal screw  fixation of the left femur with proximal cerclage wire for proximal femoral fracture. There is mild residual displacement. IMPRESSION: Intraoperative fluoroscopic assistance provided during surgical fixation of left femur fracture Electronically Signed   By: Jasmine Pang M.D.   On: 11/07/2021 18:03   (Echo, Carotid, EGD, Colonoscopy, ERCP)    Subjective: Pt denies any complaints. Pt is pleasantly confused    Discharge Exam: Vitals:   11/11/21 0356 11/11/21 0844  BP: (!) 138/52 (!) 136/47  Pulse: 85 86  Resp: 20 16  Temp: 98 F (36.7 C) 97.8 F (36.6 C)  SpO2: 99% 100%   Vitals:   11/10/21 1547 11/10/21 1945 11/11/21 0356 11/11/21 0844  BP: (!) 122/48 (!) 152/52 (!) 138/52 (!) 136/47  Pulse: 93 97 85 86  Resp: 16 20 20 16   Temp: 98.1 F (36.7 C) 98.9 F (37.2 C) 98 F (36.7 C) 97.8 F (36.6 C)  TempSrc: Oral Oral Oral   SpO2: 99% 98% 99% 100%  Weight:      Height:        General: Pt is alert, awake, not in acute distress Cardiovascular: S1/S2 +, no rubs, no gallops Respiratory: CTA bilaterally, no wheezing, no rhonchi Abdominal: Soft, NT, ND, bowel sounds + Extremities: no edema, no cyanosis    The results of significant diagnostics from this hospitalization (including imaging, microbiology, ancillary and laboratory) are listed below for reference.     Microbiology: Recent Results (from the past 240 hour(s))  Resp Panel by RT-PCR (Flu A&B, Covid) Nasopharyngeal Swab     Status: None   Collection Time: 11/06/21  1:38 PM   Specimen: Nasopharyngeal Swab; Nasopharyngeal(NP) swabs in vial transport medium  Result Value Ref Range Status   SARS Coronavirus 2 by RT PCR NEGATIVE NEGATIVE Final    Comment: (NOTE) SARS-CoV-2 target nucleic acids are NOT DETECTED.  The SARS-CoV-2 RNA is generally detectable in upper respiratory specimens during the acute phase of infection. The lowest concentration of SARS-CoV-2 viral copies this assay can detect is 138 copies/mL. A  negative result does not preclude SARS-Cov-2 infection and should not be used as the sole basis for treatment or other patient management decisions. A negative result may occur with  improper specimen collection/handling, submission of specimen other than nasopharyngeal swab, presence of viral mutation(s) within the areas targeted by this assay, and inadequate number of viral copies(<138 copies/mL). A negative result must be combined with clinical observations, patient history, and epidemiological information. The expected result is Negative.  Fact Sheet for Patients:  11/08/21  Fact Sheet for Healthcare Providers:  BloggerCourse.com  This test is no t yet approved or cleared by the [place] FDA and  has been authorized for detection and/or diagnosis of SARS-CoV-2 by FDA under an Emergency Use Authorization (EUA). This EUA will remain  in effect (meaning this test can be used) for the duration of the COVID-19 declaration under Section 564(b)(1) of  the Act, 21 U.S.C.section 360bbb-3(b)(1), unless the authorization is terminated  or revoked sooner.       Influenza A by PCR NEGATIVE NEGATIVE Final   Influenza B by PCR NEGATIVE NEGATIVE Final    Comment: (NOTE) The Xpert Xpress SARS-CoV-2/FLU/RSV plus assay is intended as an aid in the diagnosis of influenza from Nasopharyngeal swab specimens and should not be used as a sole basis for treatment. Nasal washings and aspirates are unacceptable for Xpert Xpress SARS-CoV-2/FLU/RSV testing.  Fact Sheet for Patients: BloggerCourse.com  Fact Sheet for Healthcare Providers: [employer]iousBroker.it  This test is not yet approved or cleared by the [place] FDA and has been authorized for detection and/or diagnosis of SARS-CoV-2 by FDA under an Emergency Use Authorization (EUA). This EUA will remain in effect (meaning this test can  be used) for the duration of the COVID-19 declaration under Section 564(b)(1) of the Act, 21 U.S.C. section 360bbb-3(b)(1), unless the authorization is terminated or revoked.  Performed at [facility], [address] [place], [place] [address]      Labs: BNP (last 3 results) No results for input(s): BNP in the last 8760 hours. Basic Metabolic Panel: Recent Labs  Lab 11/07/21 0810 11/08/21 0547 11/09/21 0454 11/10/21 0332 11/11/21 0711  NA 140 138 138 139 140  K 4.4 4.2 4.1 3.6 3.8  CL 109 107 107 106 104  CO2 GLUCOSE 126* 117* 106* 118* 115*  BUN CREATININE 0.70 0.67 0.62 0.51 0.51  CALCIUM 9.4 8.5* 8.5* 8.5* 8.8*   Liver Function Tests: No results for input(s): AST, ALT, ALKPHOS, BILITOT, PROT, ALBUMIN in the last 168 hours. No results for input(s): LIPASE, AMYLASE in the last 168 hours. No results for input(s): AMMONIA in the last 168 hours. CBC: Recent Labs  Lab 11/07/21 0810 11/08/21 0547 11/09/21 0454 11/09/21 1847 11/10/21 0332 11/11/21 0711  WBC 11.0* 11.0* 9.0  --  9.6 8.0  HGB 10.1* 7.8* 6.5* 8.8* 7.8* 8.4*  HCT 30.2* 23.1* 19.2* 25.0* 22.0* 24.3*  MCV 93.2 92.4 92.3  --  88.0 90.7  PLT 183 163 135*  --  156 190   Cardiac Enzymes: No results for input(s): CKTOTAL, CKMB, CKMBINDEX, TROPONINI in the last 168 hours. BNP: Invalid input(s): POCBNP CBG: No results for input(s): GLUCAP in the last 168 hours. D-Dimer No results for input(s): DDIMER in the last 72 hours. Hgb A1c No results for input(s): HGBA1C in the last 72 hours. Lipid Profile No results for input(s): CHOL, HDL, LDLCALC, TRIG, CHOLHDL, LDLDIRECT in the last 72 hours. Thyroid function studies No results for input(s): TSH, T4TOTAL, T3FREE, THYROIDAB in the last 72 hours.  Invalid input(s): FREET3 Anemia work up No results for input(s): VITAMINB12, FOLATE, FERRITIN, TIBC, IRON, RETICCTPCT in the last 72 hours. Urinalysis No results found  for: COLORURINE, APPEARANCEUR, LABSPEC, PHURINE, GLUCOSEU, HGBUR, BILIRUBINUR, KETONESUR, PROTEINUR, UROBILINOGEN, NITRITE, LEUKOCYTESUR Sepsis Labs Invalid input(s): PROCALCITONIN,  WBC,  LACTICIDVEN Microbiology Recent Results (from the past 240 hour(s))  Resp Panel by RT-PCR (Flu A&B, Covid) Nasopharyngeal Swab     Status: None   Collection Time: 11/06/21  1:38 PM   Specimen: Nasopharyngeal Swab; Nasopharyngeal(NP) swabs in vial transport medium  Result Value Ref Range Status   SARS Coronavirus 2 by RT PCR NEGATIVE NEGATIVE Final    Comment: (NOTE) SARS-CoV-2 target nucleic acids are NOT DETECTED.  The SARS-CoV-2 RNA is generally detectable in upper respiratory specimens during the acute phase of  infection. The lowest concentration of SARS-CoV-2 viral copies this assay can detect is 138 copies/mL. A negative result does not preclude SARS-Cov-2 infection and should not be used as the sole basis for treatment or other patient management decisions. A negative result may occur with  improper specimen collection/handling, submission of specimen other than nasopharyngeal swab, presence of viral mutation(s) within the areas targeted by this assay, and inadequate number of viral copies(<138 copies/mL). A negative result must be combined with clinical observations, patient history, and epidemiological information. The expected result is Negative.  Fact Sheet for Patients:  BloggerCourse.com  Fact Sheet for Healthcare Providers:  [employer]ious[employer]ker.it  This test is no t yet approved or cleared by the [place] FDA and  has been authorized for detection and/or diagnosis of SARS-CoV-2 by FDA under an Emergency Use Authorization (EUA). This EUA will remain  in effect (meaning this test can be used) for the duration of the COVID-19 declaration under Section 564(b)(1) of the Act, 21 U.S.C.section 360bbb-3(b)(1), unless the authorization is  terminated  or revoked sooner.       Influenza A by PCR NEGATIVE NEGATIVE Final   Influenza B by PCR NEGATIVE NEGATIVE Final    Comment: (NOTE) The Xpert Xpress SARS-CoV-2/FLU/RSV plus assay is intended as an aid in the diagnosis of influenza from Nasopharyngeal swab specimens and should not be used as a sole basis for treatment. Nasal washings and aspirates are unacceptable for Xpert Xpress SARS-CoV-2/FLU/RSV testing.  Fact Sheet for Patients: BloggerCourse.com  Fact Sheet for Healthcare Providers: [employer]iousBroker.it  This test is not yet approved or cleared by the [place] FDA and has been authorized for detection and/or diagnosis of SARS-CoV-2 by FDA under an Emergency Use Authorization (EUA). This EUA will remain in effect (meaning this test can be used) for the duration of the COVID-19 declaration under Section 564(b)(1) of the Act, 21 U.S.C. section 360bbb-3(b)(1), unless the authorization is terminated or revoked.  Performed at [facility], [address], [place], [place] [address]      Time coordinating discharge: Over 30 minutes  SIGNED:   Charise Killian, MD  [facility] 11/11/2021, 12:38 PM Pager   If 7PM-7AM, please contact night-coverage

## 2022-01-05 ENCOUNTER — Encounter: Payer: Self-pay | Admitting: Internal Medicine

## 2022-08-01 ENCOUNTER — Other Ambulatory Visit: Payer: Self-pay | Admitting: *Deleted

## 2022-08-01 DIAGNOSIS — Z1231 Encounter for screening mammogram for malignant neoplasm of breast: Secondary | ICD-10-CM

## 2022-11-02 IMAGING — DX DG FEMUR 2+V*R*
4 series · 4 of 4 positions shown · non-contrast
Comparison: Hip radiograph 10/05/2020

CLINICAL DATA: Right femur IM nail

EXAM:
RIGHT FEMUR 2 VIEWS

[femur ap (1 of 2)]
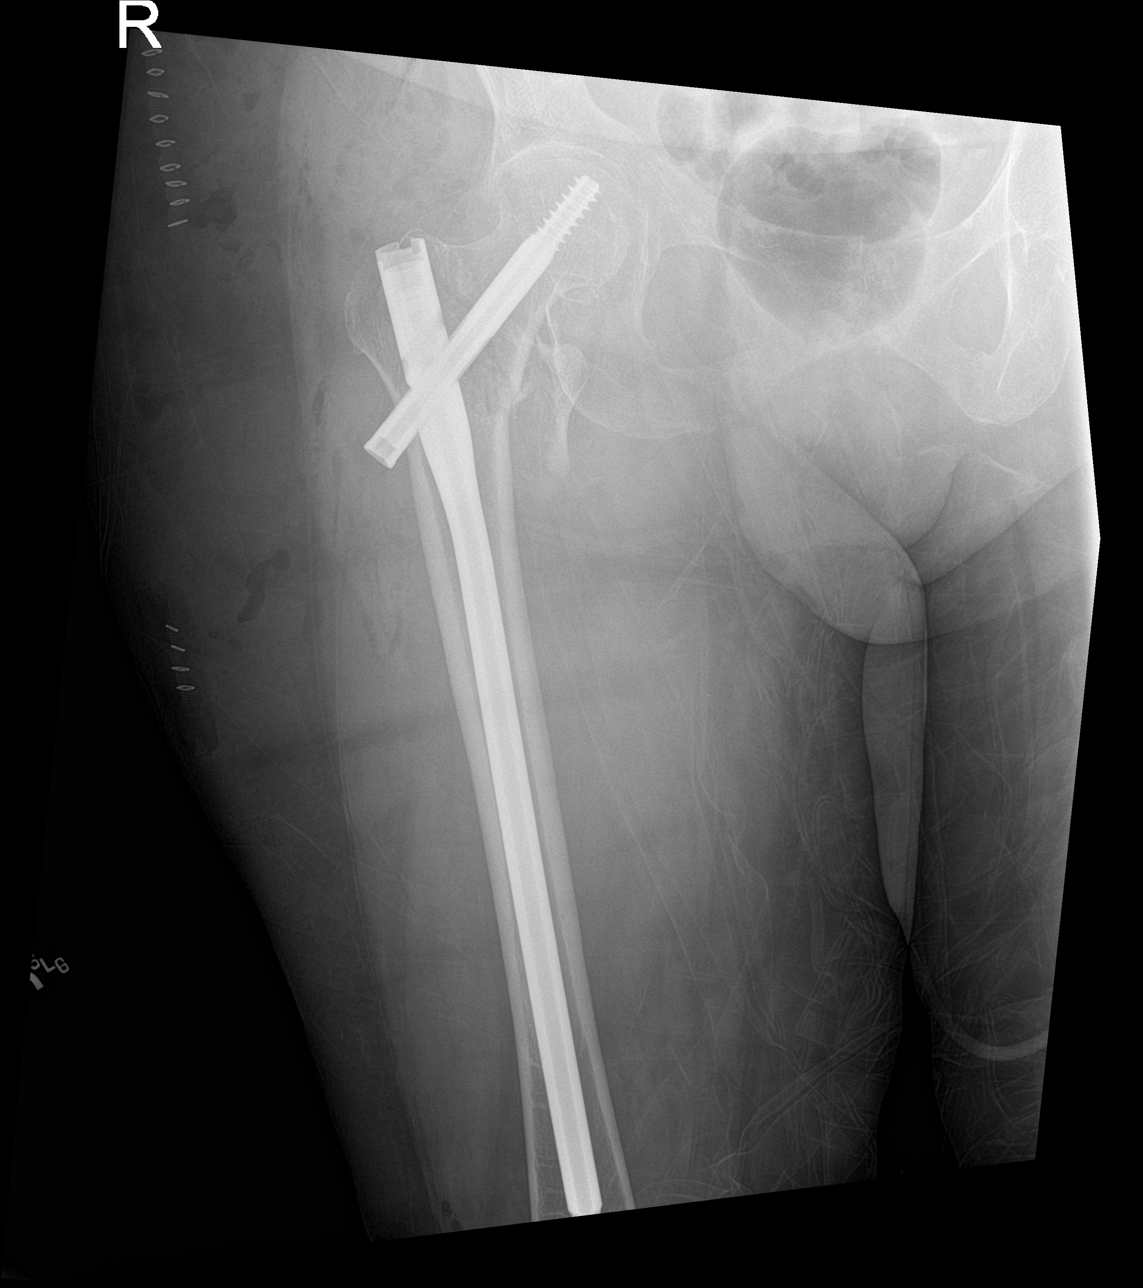

[femur ap (2 of 2)]
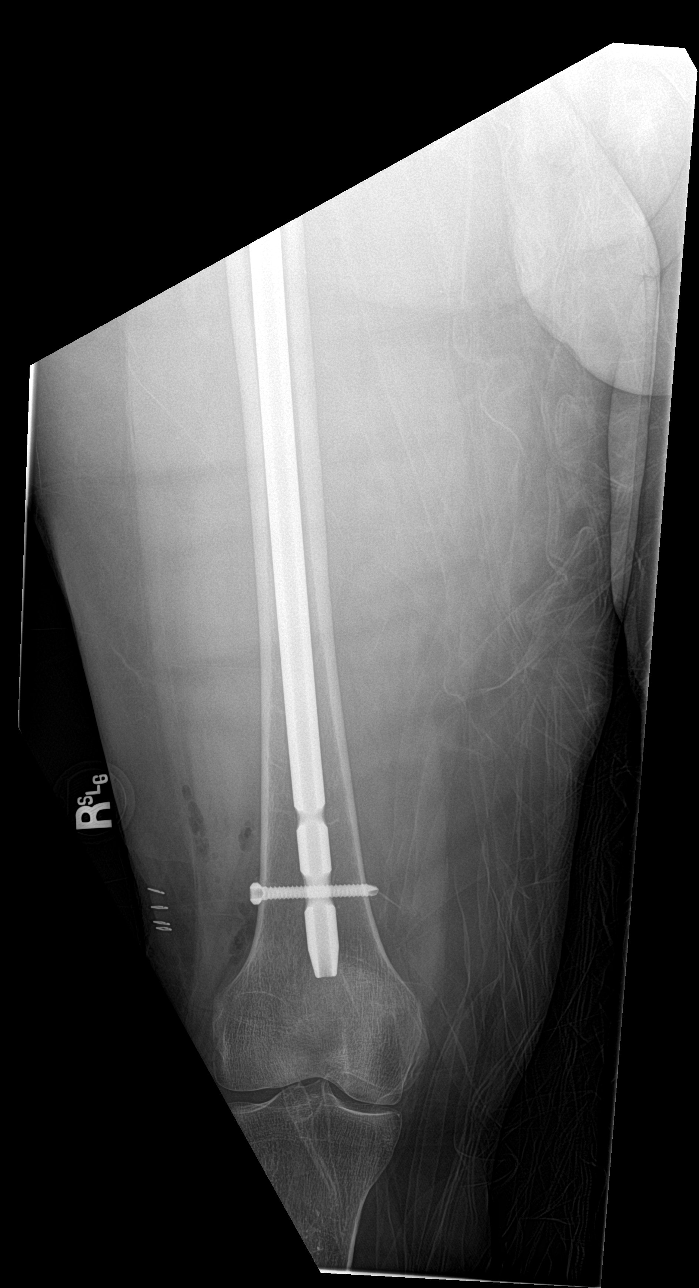

[femur lat (1 of 2)]
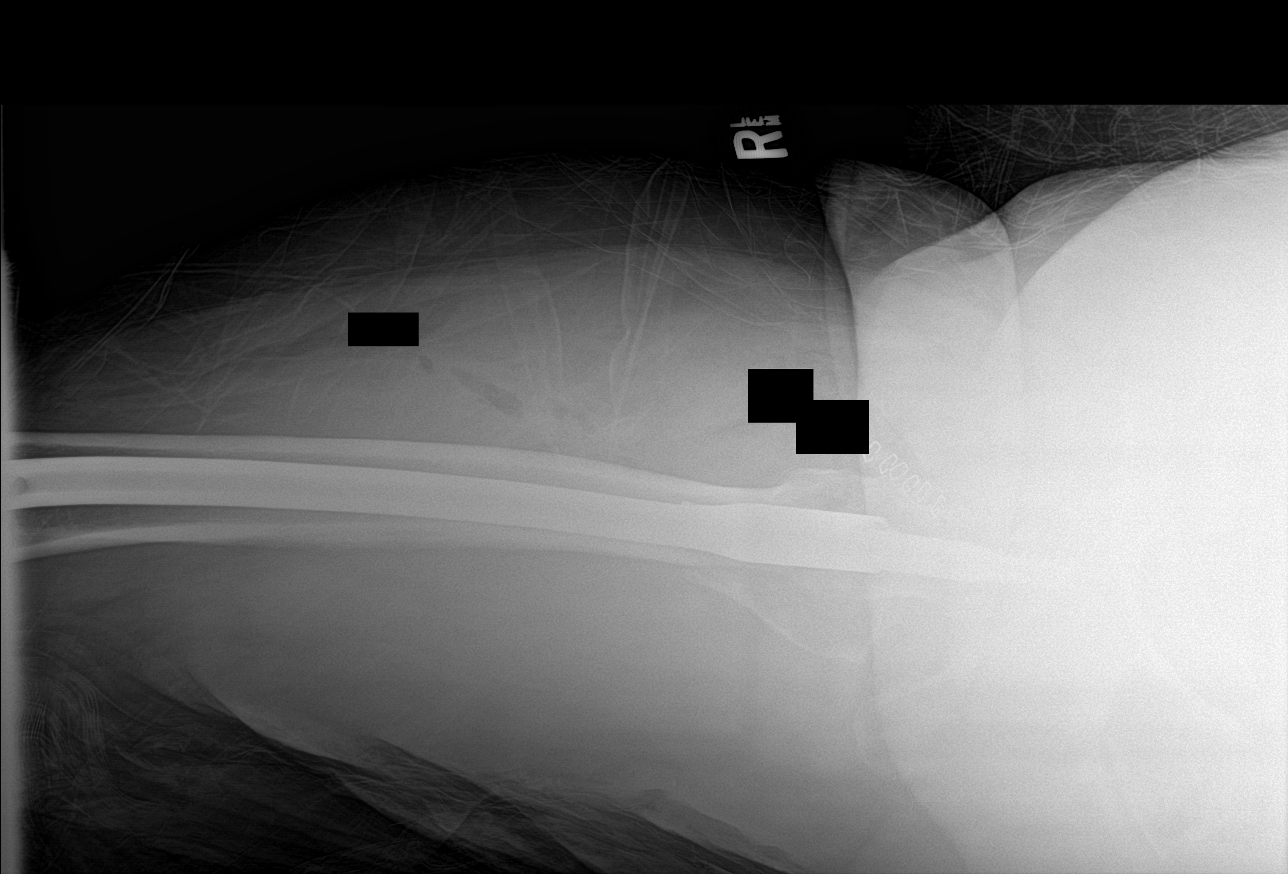

[femur lat (2 of 2)]
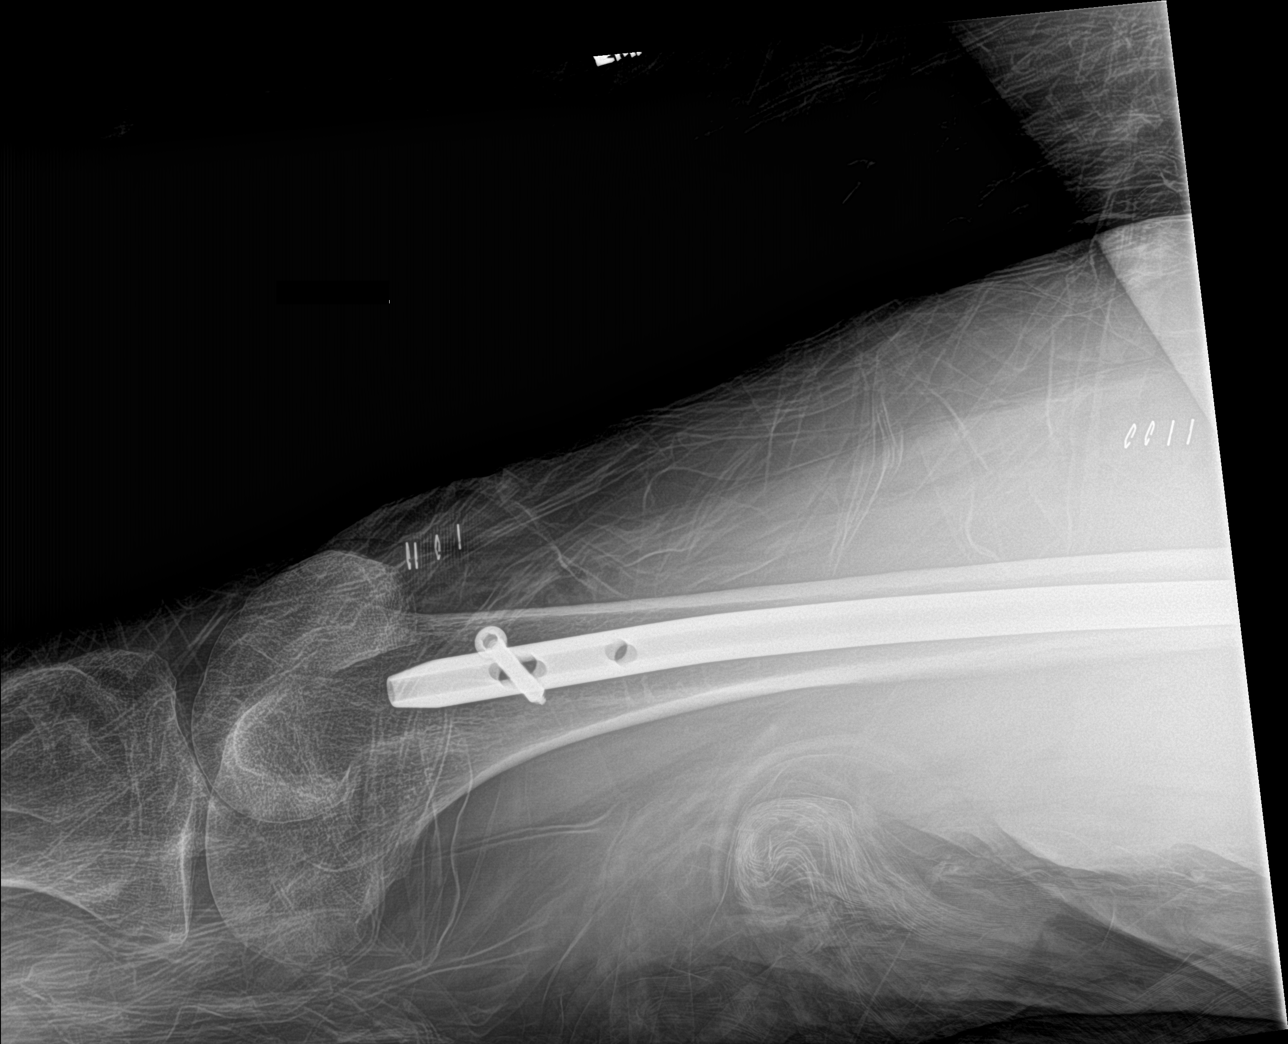

[4 of 4 positions shown; findings below may reference images not displayed]

FINDINGS: Right femoral IM nail with hip screw noted, the intramedullary nail
is long stem extending to the distal metadiaphysis, with a single
distal interlocking screw. Near anatomic alignment of the
intertrochanteric fracture aside from the mildly displaced lesser
trochanteric fragment.
IMPRESSION: 1. Right femoral IM nail placement for ORIF of intertrochanteric
fracture. Near anatomic alignment aside from the mildly displaced
lesser trochanteric fragment.

## 2022-11-02 IMAGING — XA DG C-ARM 1-60 MIN
4 series · 4 of 4 positions shown · non-contrast
Comparison: COMPARISON
Hip radiograph 10/05/2020

CLINICAL DATA: Right femur fracture, ORIF

EXAM:
DG C-ARM 1-60 MIN; RIGHT FEMUR 2 VIEWS

[Series 5: ortho standard · 1 of 1 slices shown (1 of 4)]
[im 1/1]
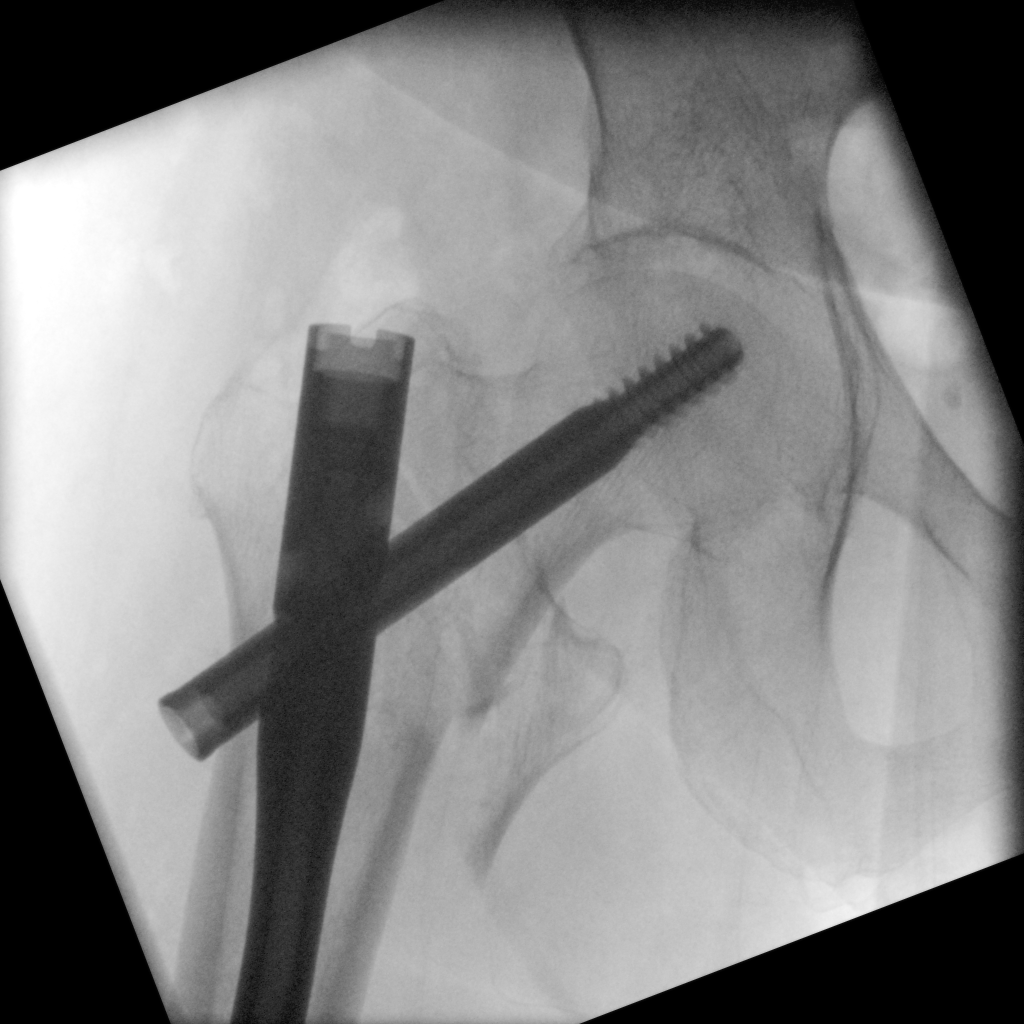

[Series 6: ortho standard · 1 of 1 slices shown (2 of 4)]
[im 1/1]
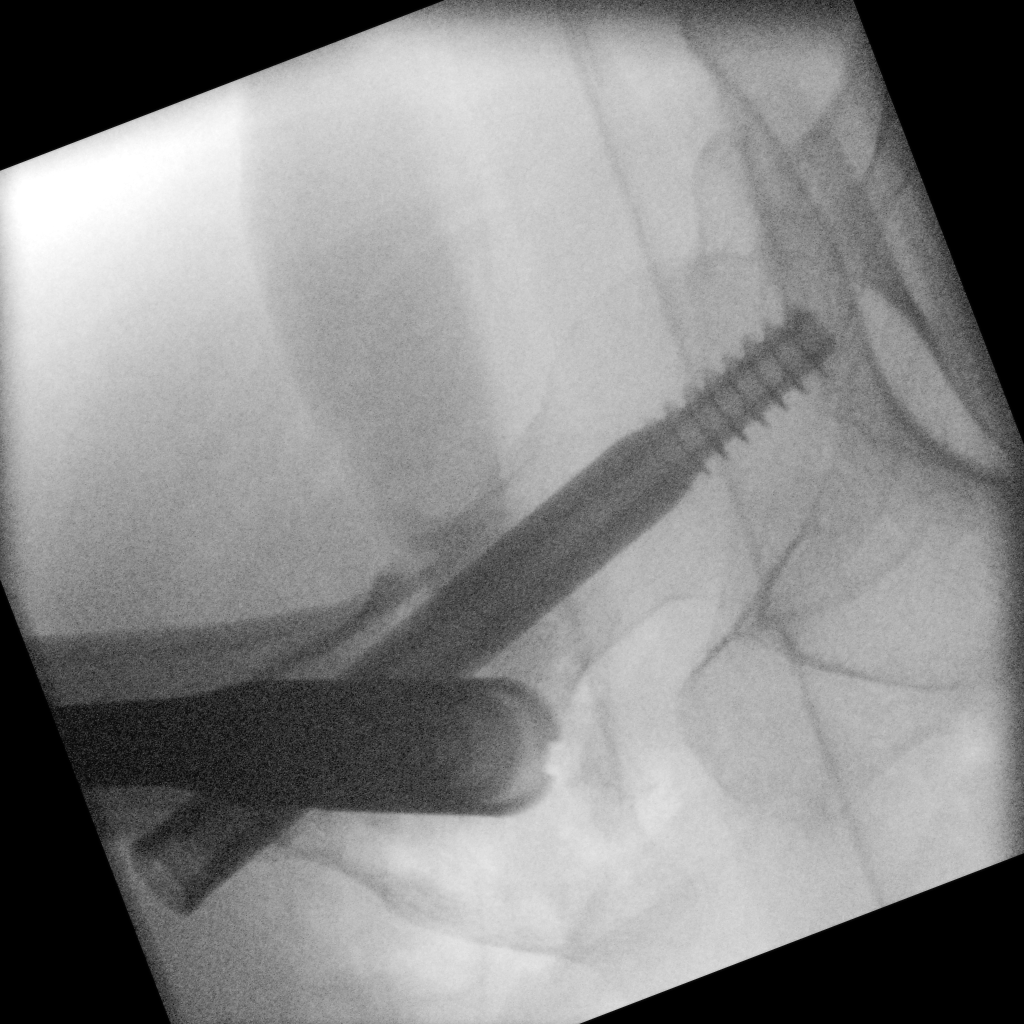

[Series 7: ortho standard · 1 of 1 slices shown (3 of 4)]
[im 1/1]
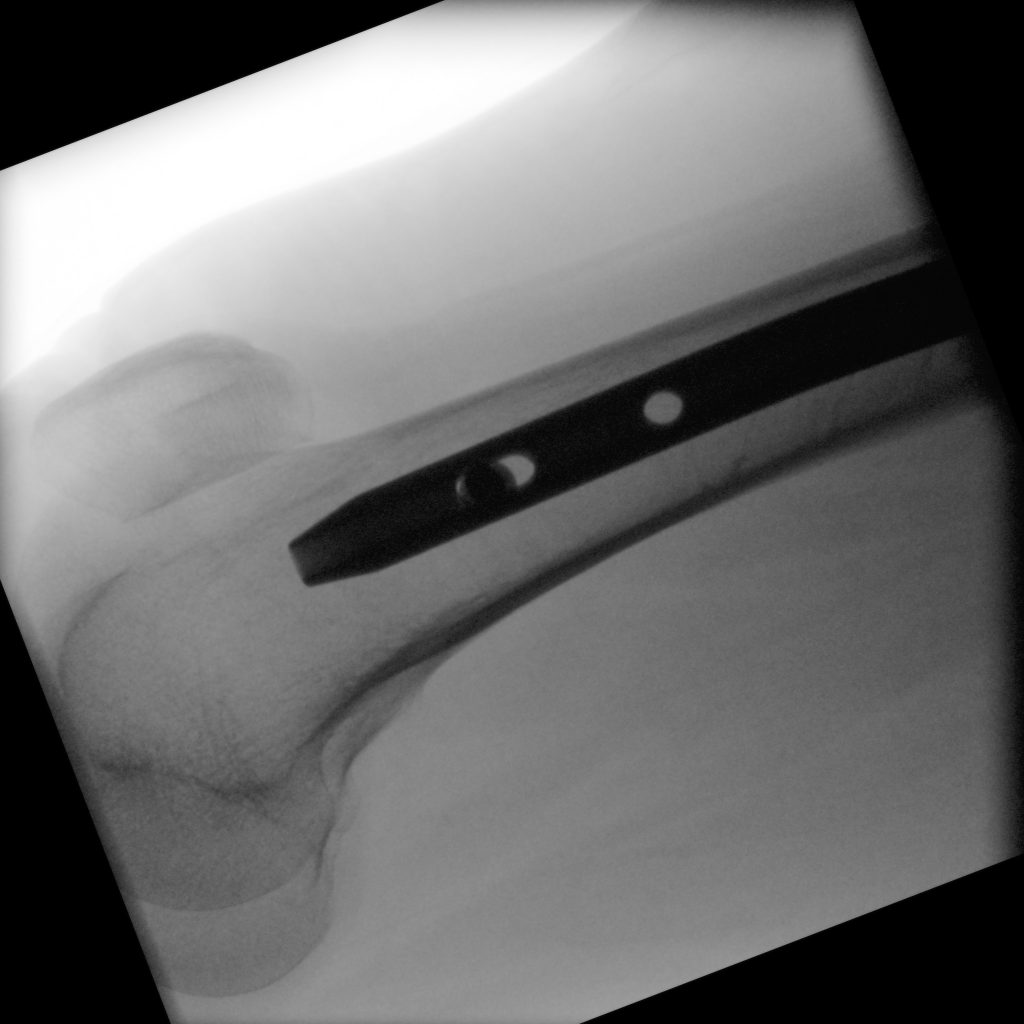

[Series 8: ortho standard · 1 of 1 slices shown (4 of 4)]
[im 1/1]
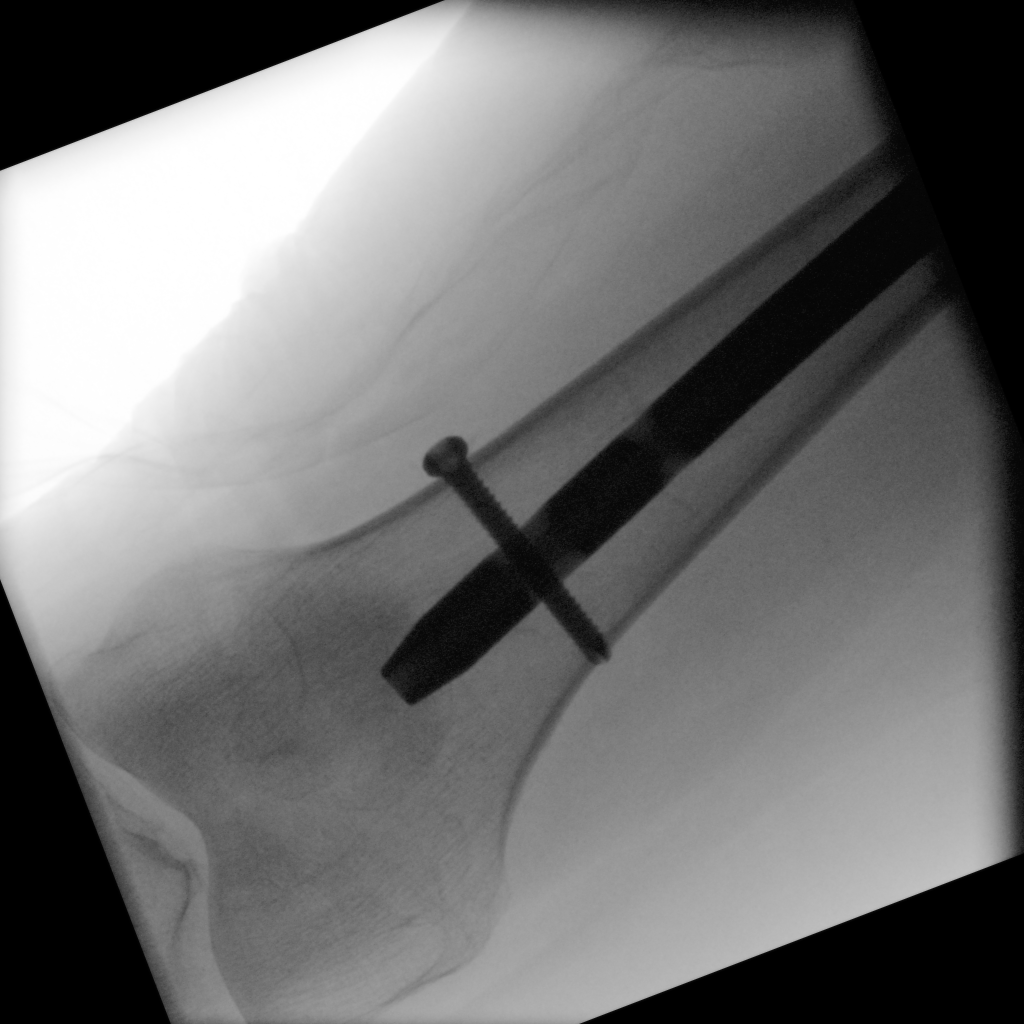

[4 of 4 positions shown; findings below may reference images not displayed]

FINDINGS: Fluoroscopic spot images demonstrate femoral IM nail and hip screw
fixation of a intertrochanteric fracture. Separate lesser
trochanteric fragment. Single interlocking screw distally in the IM
nail. No complicating feature.
IMPRESSION: 1. ORIF of a right intertrochanteric fracture.
